# Patient Record
Sex: Female | Born: 1980 | Race: Black or African American | Hispanic: No | Marital: Single | Smoking: Former smoker
Health system: Southern US, Community
[De-identification: ages and names within clinical notes are randomized; demographics above are authoritative.]

## PROBLEM LIST (undated history)

## (undated) ENCOUNTER — Inpatient Hospital Stay (HOSPITAL_COMMUNITY): Payer: Self-pay

## (undated) DIAGNOSIS — F32A Depression, unspecified: Secondary | ICD-10-CM

## (undated) DIAGNOSIS — I2699 Other pulmonary embolism without acute cor pulmonale: Secondary | ICD-10-CM

## (undated) DIAGNOSIS — D573 Sickle-cell trait: Secondary | ICD-10-CM

## (undated) DIAGNOSIS — D649 Anemia, unspecified: Secondary | ICD-10-CM

## (undated) DIAGNOSIS — F419 Anxiety disorder, unspecified: Secondary | ICD-10-CM

## (undated) DIAGNOSIS — IMO0002 Reserved for concepts with insufficient information to code with codable children: Secondary | ICD-10-CM

## (undated) DIAGNOSIS — F329 Major depressive disorder, single episode, unspecified: Secondary | ICD-10-CM

## (undated) HISTORY — PX: FRACTURE SURGERY: SHX138

## (undated) HISTORY — PX: COLPOSCOPY: SHX161

---

## 2008-03-14 DIAGNOSIS — IMO0002 Reserved for concepts with insufficient information to code with codable children: Secondary | ICD-10-CM

## 2008-03-14 DIAGNOSIS — R87619 Unspecified abnormal cytological findings in specimens from cervix uteri: Secondary | ICD-10-CM

## 2008-03-14 HISTORY — DX: Unspecified abnormal cytological findings in specimens from cervix uteri: R87.619

## 2008-03-14 HISTORY — DX: Reserved for concepts with insufficient information to code with codable children: IMO0002

## 2010-10-20 ENCOUNTER — Emergency Department (HOSPITAL_COMMUNITY)
Admission: EM | Admit: 2010-10-20 | Discharge: 2010-10-20 | Disposition: A | Discharge status: Home / Self Care | Payer: Medicaid Other | Attending: Emergency Medicine | Admitting: Emergency Medicine

## 2010-10-20 DIAGNOSIS — B86 Scabies: Secondary | ICD-10-CM | POA: Insufficient documentation

## 2010-12-16 ENCOUNTER — Emergency Department (HOSPITAL_COMMUNITY)
Admission: EM | Admit: 2010-12-16 | Discharge: 2010-12-16 | Disposition: A | Discharge status: Home / Self Care | Payer: Medicaid Other | Attending: Emergency Medicine | Admitting: Emergency Medicine

## 2010-12-16 DIAGNOSIS — B373 Candidiasis of vulva and vagina: Secondary | ICD-10-CM | POA: Insufficient documentation

## 2010-12-16 DIAGNOSIS — R059 Cough, unspecified: Secondary | ICD-10-CM | POA: Insufficient documentation

## 2010-12-16 DIAGNOSIS — B3731 Acute candidiasis of vulva and vagina: Secondary | ICD-10-CM | POA: Insufficient documentation

## 2010-12-16 DIAGNOSIS — R05 Cough: Secondary | ICD-10-CM | POA: Insufficient documentation

## 2010-12-16 DIAGNOSIS — J069 Acute upper respiratory infection, unspecified: Secondary | ICD-10-CM | POA: Insufficient documentation

## 2011-09-03 ENCOUNTER — Emergency Department (HOSPITAL_COMMUNITY)
Admission: EM | Admit: 2011-09-03 | Discharge: 2011-09-04 | Disposition: A | Discharge status: Home / Self Care | Payer: Self-pay | Attending: Emergency Medicine | Admitting: Emergency Medicine

## 2011-09-03 ENCOUNTER — Encounter (HOSPITAL_COMMUNITY): Payer: Self-pay | Admitting: Emergency Medicine

## 2011-09-03 DIAGNOSIS — R110 Nausea: Secondary | ICD-10-CM | POA: Insufficient documentation

## 2011-09-03 LAB — BASIC METABOLIC PANEL
Calcium: 9.4 mg/dL (ref 8.4–10.5)
GFR calc non Af Amer: >90 mL/min (ref >90)
Sodium: 140 mEq/L (ref 135–145)

## 2011-09-03 LAB — CBC
MCH: 30.4 pg (ref 26.0–34.0)
MCHC: 32.9 g/dL (ref 30.0–36.0)
Platelets: 275 10*3/uL (ref 150–400)

## 2011-09-03 LAB — URINALYSIS, ROUTINE W REFLEX MICROSCOPIC
Bilirubin Urine: NEGATIVE
Glucose, UA: NEGATIVE mg/dL
Ketones, ur: NEGATIVE mg/dL
Leukocytes, UA: NEGATIVE
Nitrite: NEGATIVE
Protein, ur: NEGATIVE mg/dL

## 2011-09-03 LAB — HCG, SERUM, QUALITATIVE: Preg, Serum: NEGATIVE

## 2011-09-03 MED ORDER — ONDANSETRON HCL 4 MG/2ML IJ SOLN
4.0000 mg | Freq: Once | INTRAMUSCULAR | Status: AC
  Administered 2011-09-03: 4 mg via INTRAVENOUS
  Filled 2011-09-03: qty 2

## 2011-09-03 MED ORDER — SODIUM CHLORIDE 0.9 % IV BOLUS (SEPSIS)
1000.0000 mL | Freq: Once | INTRAVENOUS | Status: AC
  Administered 2011-09-03: 1000 mL via INTRAVENOUS

## 2011-09-03 NOTE — ED Notes (Signed)
Pt states for "a couple weeks" she has felt weak and also c/o nausea. Pt states she's had cold-like symptoms during this time as well. Productive cough present.

## 2011-09-04 MED ORDER — PROMETHAZINE HCL 25 MG PO TABS: 25.0000 mg | ORAL_TABLET | Freq: Four times a day (QID) | ORAL | Status: DC | PRN

## 2011-09-04 NOTE — ED Provider Notes (Signed)
History     CSN: [id number]  Arrival date & time 09/03/11  2008   First MD Initiated Contact with Patient 09/03/11 2113      Chief Complaint  Patient presents with  . Nausea  . Weakness     The history is provided by the patient.   the patient reports approximately one to 2 weeks of "feeling weird".  She reports nausea without vomiting.  She denies diarrhea.  She's had some upper respiratory symptoms.  She's had cough as well.  She denies chest pain or shortness of breath.  She denies abdominal pain.  She has no dysuria or urinary frequency.  She has no back pain or flank pain.  She denies vaginal pain or vaginal discharge.  She's had no recent sick contacts.  Symptoms are mild in severity.  Nothing worsens nor bruits her symptoms  History reviewed. No pertinent past medical history.  History reviewed. No pertinent past surgical history.  History reviewed. No pertinent family history.  History  Substance Use Topics  . Smoking status: Not on file  . Smokeless tobacco: Not on file  . Alcohol Use: Not on file    OB History    Grav Para Term Preterm Abortions TAB SAB Ect Mult Living                  Review of Systems  Neurological: Positive for weakness.  All other systems reviewed and are negative.    Allergies  Penicillins  Home Medications   Current Outpatient Rx  Name Route Sig Dispense Refill  . ADULT MULTIVITAMIN W/MINERALS CH Oral Take 1 tablet by mouth daily.    Marland Kitchen DAILY DIET SUPPORT PO TABS Oral Take 1 tablet by mouth daily. "Hydroxycut"      BP 113/63  Pulse 75  Temp 98.3 F (36.8 C) (Oral)  Resp 24  SpO2 100%  Physical Exam  Nursing note and vitals reviewed. Constitutional: She is oriented to person, place, and time. She appears well-developed and well-nourished. No distress.  HENT:  Head: Normocephalic and atraumatic.  Eyes: EOM are normal.  Neck: Normal range of motion.  Cardiovascular: Normal rate, regular rhythm and normal heart sounds.     Pulmonary/Chest: Effort normal and breath sounds normal.  Abdominal: Soft. She exhibits no distension. There is no tenderness.  Musculoskeletal: Normal range of motion.  Neurological: She is alert and oriented to person, place, and time.  Skin: Skin is warm and dry.  Psychiatric: She has a normal mood and affect. Judgment normal.    ED Course  Procedures (including critical care time)  Labs Reviewed  CBC - Abnormal; Notable for the following:    RBC 3.81 (*)     Hemoglobin 11.6 (*)     HCT 35.3 (*)     All other components within normal limits  BASIC METABOLIC PANEL - Abnormal; Notable for the following:    Glucose, Bld 105 (*)     All other components within normal limits  URINALYSIS, ROUTINE W REFLEX MICROSCOPIC - Abnormal; Notable for the following:    APPearance CLOUDY (*)     All other components within normal limits  HCG, SERUM, QUALITATIVE  PREGNANCY, URINE   No results found.   1. Nausea       MDM  The patient is well-appearing.  Her vital signs are normal.  Her abdomen is benign.  Her lungs are clear.  Discharge home in good condition.  She will be sent home with prescription for nausea.  Lyanne Co, MD 09/04/11 [phone]

## 2012-02-24 ENCOUNTER — Encounter (HOSPITAL_COMMUNITY): Payer: Self-pay | Admitting: Emergency Medicine

## 2012-02-24 ENCOUNTER — Emergency Department (HOSPITAL_COMMUNITY)
Admission: EM | Admit: 2012-02-24 | Discharge: 2012-02-24 | Disposition: A | Discharge status: Home / Self Care | Payer: Self-pay | Attending: Emergency Medicine | Admitting: Emergency Medicine

## 2012-02-24 DIAGNOSIS — J029 Acute pharyngitis, unspecified: Secondary | ICD-10-CM | POA: Insufficient documentation

## 2012-02-24 DIAGNOSIS — J3489 Other specified disorders of nose and nasal sinuses: Secondary | ICD-10-CM | POA: Insufficient documentation

## 2012-02-24 DIAGNOSIS — R0989 Other specified symptoms and signs involving the circulatory and respiratory systems: Secondary | ICD-10-CM | POA: Insufficient documentation

## 2012-02-24 DIAGNOSIS — Z79899 Other long term (current) drug therapy: Secondary | ICD-10-CM | POA: Insufficient documentation

## 2012-02-24 DIAGNOSIS — J069 Acute upper respiratory infection, unspecified: Secondary | ICD-10-CM | POA: Insufficient documentation

## 2012-02-24 DIAGNOSIS — R05 Cough: Secondary | ICD-10-CM | POA: Insufficient documentation

## 2012-02-24 DIAGNOSIS — R059 Cough, unspecified: Secondary | ICD-10-CM | POA: Insufficient documentation

## 2012-02-24 DIAGNOSIS — H9209 Otalgia, unspecified ear: Secondary | ICD-10-CM | POA: Insufficient documentation

## 2012-02-24 DIAGNOSIS — F17200 Nicotine dependence, unspecified, uncomplicated: Secondary | ICD-10-CM | POA: Insufficient documentation

## 2012-02-24 MED ORDER — FEXOFENADINE-PSEUDOEPHED ER 60-120 MG PO TB12: 1.0000 | ORAL_TABLET | Freq: Two times a day (BID) | ORAL | Status: DC

## 2012-02-24 NOTE — ED Provider Notes (Signed)
History  This chart was scribed for Nancy Bucco, MD by Shari Heritage, ED Scribe. The patient was seen in room TR11C/TR11C. Patient's care was started at 1148.  CSN: [id number]  Arrival date & time 02/24/12  1478   First MD Initiated Contact with Patient 02/24/12 1148      Chief Complaint  Patient presents with  . Headache    The history is provided by the patient. No language interpreter was used.    HPI Comments: Nancy Francis is a 31 y.o. female who presents to the Emergency Department complaining of moderate to severe, constant, pressure-like headache onset yesterday night. Patient states that her pain is present in bilateral temporal regions and maxillary region. There is associated sore throat, nasal and chest congestion, productive cough, and ear pain. Patient states that she is producing yellow phlegm. Patient denies nausea, vomiting, diarrhea, SOB, chest pain, leg swelling, or abdominal pain. She hasn't taken any medications for symptom relief. Patient's two children have checked in at [facility] today complaining of similar symptoms. Patient is a current every day smoker.   History reviewed. No pertinent past medical history.  Past Surgical History  Procedure Date  . Fracture surgery     No family history on file.  History  Substance Use Topics  . Smoking status: Current Every Day Smoker  . Smokeless tobacco: Not on file  . Alcohol Use: Yes    OB History    Grav Para Term Preterm Abortions TAB SAB Ect Mult Living                  Review of Systems  Constitutional: Negative for fever, chills, diaphoresis and fatigue.  HENT: Positive for ear pain, congestion and sore throat. Negative for rhinorrhea and sneezing.   Eyes: Negative.   Respiratory: Positive for cough. Negative for chest tightness and shortness of breath.   Cardiovascular: Negative for chest pain and leg swelling.  Gastrointestinal: Negative for nausea, vomiting, abdominal pain, diarrhea and blood in  stool.  Genitourinary: Negative for frequency, hematuria, flank pain and difficulty urinating.  Musculoskeletal: Negative for back pain and arthralgias.  Skin: Negative for rash.  Neurological: Negative for dizziness, speech difficulty, weakness, numbness and headaches.    Allergies  Penicillins  Home Medications   Current Outpatient Rx  Name  Route  Sig  Dispense  Refill  . FEXOFENADINE-PSEUDOEPHED ER 60-120 MG PO TB12   Oral   Take 1 tablet by mouth every 12 (twelve) hours.   30 tablet   0   . ADULT MULTIVITAMIN W/MINERALS CH   Oral   Take 1 tablet by mouth daily.         Marland Kitchen DAILY DIET SUPPORT PO TABS   Oral   Take 1 tablet by mouth daily. "Hydroxycut"         . PROMETHAZINE HCL 25 MG PO TABS   Oral   Take 1 tablet (25 mg total) by mouth every 6 (six) hours as needed for nausea.   12 tablet   0     Triage Vitals: BP 118/71  Pulse 95  Temp 97.8 F (36.6 C)  Resp 18  SpO2 99%  Physical Exam  Constitutional: She is oriented to person, place, and time. She appears well-developed and well-nourished.  HENT:  Head: Normocephalic and atraumatic.  Right Ear: Tympanic membrane and external ear normal.  Left Ear: Tympanic membrane and external ear normal.  Mouth/Throat: Oropharynx is clear and moist. No posterior oropharyngeal erythema.  Eyes: Pupils are equal,  round, and reactive to light.  Neck: Normal range of motion. Neck supple.  Cardiovascular: Normal rate, regular rhythm and normal heart sounds.   Pulmonary/Chest: Effort normal and breath sounds normal. No respiratory distress. She has no wheezes. She has no rales. She exhibits no tenderness.  Abdominal: Soft. Bowel sounds are normal. There is no tenderness. There is no rebound and no guarding.  Musculoskeletal: Normal range of motion. She exhibits no edema.  Lymphadenopathy:    She has no cervical adenopathy.  Neurological: She is alert and oriented to person, place, and time.  Skin: Skin is warm and dry.  No rash noted.  Psychiatric: She has a normal mood and affect.    ED Course  Procedures (including critical care time) DIAGNOSTIC STUDIES: Oxygen Saturation is 99% on room air, normal by my interpretation.    COORDINATION OF CARE: 12:23 PM- Patient informed of current plan for treatment and evaluation and agrees with plan at this time.     Labs Reviewed - No data to display No results found.   1. URI (upper respiratory infection)       MDM  Patient is well-appearing with likely viral syndrome. She has no meningeal signs. Her headache is mostly facial which is likely attributed to the URI. Her kids are here with the same type symptoms. She was instructed in symptomatic care. Was given a prescription for a Allegra D. Is advised to followup with her primary care physician her symptoms are not improving or return here as needed for any worsening symptoms      I personally performed the services described in this documentation, which was scribed in my presence.  The recorded information has been reviewed and considered.    Nancy Bucco, MD 02/24/12 [phone]

## 2012-02-24 NOTE — ED Notes (Signed)
H/a since last night  Cold s/s also Has sinus congestion also here w/ her 2 kids that also being seen

## 2012-03-14 NOTE — L&D Delivery Note (Signed)
Delivery Note At 12:55 AM a viable female was delivered via  (Presentation: OA ).  APGAR: 9,9 ; weight: 6+9.8. Infant lifted to pt's abd and dried. Cord clamped and cut by FOB. Hospital cord blood sample collected. Placenta status: spont, intact .  Cord:  3 vessel  Anesthesia: Epidural  Episiotomy: none Lacerations: none Est. Blood Loss (mL): 200  Mom to postpartum.  Baby to nursery-stable.  SHAW, KIMBERLY 11/06/2012, 1:13 AM

## 2012-03-14 NOTE — L&D Delivery Note (Signed)
Attestation of Attending Supervision of Advanced Practitioner (CNM/NP): Evaluation and management procedures were performed by the Advanced Practitioner under my supervision and collaboration.  I have reviewed the Advanced Practitioner's note and chart, and I agree with the management and plan.  HARRAWAY-SMITH, Margo Lama 9:12 AM     

## 2012-07-03 ENCOUNTER — Encounter (HOSPITAL_COMMUNITY): Payer: Self-pay

## 2012-07-03 ENCOUNTER — Inpatient Hospital Stay (HOSPITAL_COMMUNITY)
Admission: AD | Admit: 2012-07-03 | Discharge: 2012-07-03 | Disposition: A | Discharge status: Home / Self Care | Payer: Medicaid Other | Source: Ambulatory Visit | Attending: Obstetrics & Gynecology | Admitting: Obstetrics & Gynecology

## 2012-07-03 DIAGNOSIS — W57XXXA Bitten or stung by nonvenomous insect and other nonvenomous arthropods, initial encounter: Secondary | ICD-10-CM

## 2012-07-03 DIAGNOSIS — R21 Rash and other nonspecific skin eruption: Secondary | ICD-10-CM | POA: Insufficient documentation

## 2012-07-03 DIAGNOSIS — O99891 Other specified diseases and conditions complicating pregnancy: Secondary | ICD-10-CM | POA: Insufficient documentation

## 2012-07-03 DIAGNOSIS — R51 Headache: Secondary | ICD-10-CM | POA: Insufficient documentation

## 2012-07-03 HISTORY — DX: Anemia, unspecified: D64.9

## 2012-07-03 HISTORY — DX: Reserved for concepts with insufficient information to code with codable children: IMO0002

## 2012-07-03 HISTORY — DX: Sickle-cell trait: D57.3

## 2012-07-03 MED ORDER — HYDROCORTISONE 1 % EX CREA: TOPICAL_CREAM | Freq: Three times a day (TID) | CUTANEOUS | Status: DC

## 2012-07-03 NOTE — MAU Note (Signed)
Patient is in with c/o right lateral neck rash (4 raised reddened rash on patient neck and behind right ear. No draining). Patient states that she noticed the rash this morning, and since then have a headache, rash on her chest. Patient states that "she feels weird". She denies having shortness of breath, denies pain when swallowing, denies abdominal pain, denies vaginal bleeding or abnormal vaginal discharge. Patient states that her [facility] OB app[facility] is next [facility]. FHT 143-151

## 2012-07-03 NOTE — MAU Provider Note (Signed)
History     CSN: [id number]  Arrival date and time: 07/03/12 1624   First Provider Initiated Contact with Patient 07/03/12 1706      Chief Complaint  Patient presents with  . Rash  . Headache  . Otalgia   HPI 32 y.o. G4P3003 at [redacted]w[redacted]d with bumps on neck since this morning. Not itching. No fever/chills. No trouble breathing or swallowing. Has been outside walking earlier. Not sore or tender. Has allergy to PCN but no medications recently. No new foods or skin products. Hasn't seen any biting insects. No other family members with bites. C/o headache and some mild vertigo. No nausea/vomiting.   No prenatal care. [facility]   LMP November. All vaginal deliveries x 3. Youngest child 87 yo.   OB History   Grav Para Term Preterm Abortions TAB SAB Ect Mult Living   4 3 3       3       Past Medical History  Diagnosis Date  . Sickle cell trait   . Abnormal Pap smear   . Anemia     Past Surgical History  Procedure Laterality Date  . Fracture surgery    . Colposcopy      History reviewed. No pertinent family history.  History  Substance Use Topics  . Smoking status: Former Games developer  . Smokeless tobacco: Not on file  . Alcohol Use: Yes    Allergies:  Allergies  Allergen Reactions  . Penicillins Hives    Prescriptions prior to admission  Medication Sig Dispense Refill  . Prenatal Vit-Fe Fumarate-FA (PRENATAL MULTIVITAMIN) TABS Take 1 tablet by mouth daily at 12 noon.      . promethazine (PHENERGAN) 25 MG tablet Take 1 tablet (25 mg total) by mouth every 6 (six) hours as needed for nausea.  12 tablet  0  . [DISCONTINUED] fexofenadine-pseudoephedrine (ALLEGRA-D) 60-120 MG per tablet Take 1 tablet by mouth every 12 (twelve) hours.  30 tablet  0  . [DISCONTINUED] Multiple Vitamin (MULTIVITAMIN WITH MINERALS) TABS Take 1 tablet by mouth daily.      . [DISCONTINUED] Multiple Vitamins-Minerals (DAILY DIET SUPPORT) TABS Take 1 tablet by mouth  daily. "Hydroxycut"        Review of Systems  Constitutional: Negative for fever and chills.  Eyes: Negative for blurred vision and double vision.  Respiratory: Negative for cough, shortness of breath and wheezing.   Cardiovascular: Negative for chest pain.  Gastrointestinal: Negative for nausea, vomiting and abdominal pain.  Genitourinary: Negative for dysuria.  Skin: Positive for rash. Negative for itching.  Neurological: Positive for headaches.   Physical Exam   Blood pressure 119/69, pulse 106, temperature 99.1 F (37.3 C), temperature source Oral, resp. rate 18, height 5\' 4"  (1.626 m), weight 85.787 kg (189 lb 2 oz), SpO2 100.00%.  Physical Exam  Constitutional: She is oriented to person, place, and time. She appears well-developed and well-nourished. No distress.  HENT:  Head: Normocephalic and atraumatic.  Right Ear: External ear normal.  Left Ear: External ear normal.  Mouth/Throat: Oropharynx is clear and moist.  Eyes: Conjunctivae and EOM are normal. Right eye exhibits no discharge. Left eye exhibits no discharge. No scleral icterus.  Neck: Normal range of motion. Neck supple. No tracheal deviation present.  Cardiovascular: Normal rate, regular rhythm and normal heart sounds.   Respiratory: Effort normal and breath sounds normal. No stridor. No respiratory distress. She has no wheezes.  GI: Soft. Bowel sounds are normal. There is no tenderness.  There is no rebound and no guarding.  Musculoskeletal: Normal range of motion. She exhibits no edema and no tenderness.  Lymphadenopathy:    She has no cervical adenopathy.  Neurological: She is alert and oriented to person, place, and time.  Skin: Skin is warm and dry.  Several pale/red raised papules/wheals on right neck with one or two post-auricular. Non-tender. Largest about 1 cm. No blistering or drainage, non-fluctuant.   Psychiatric: She has a normal mood and affect.    MAU Course  Procedures   Assessment and Plan   32 y.o. G4P3003 at [redacted]w[redacted]d with allergic rash on neck - Likely bug bites - Hydrocortisone cream - Benadryl as needed for itching. - precautions regarding worsening allergic reaction - wheezing, SOB, throat tightness- given. - F/U with OB or PCP as needed.  Napoleon Form 07/03/2012, 5:08 PM

## 2012-07-03 NOTE — MAU Provider Note (Signed)
Attestation of Attending Supervision of Obstetric Fellow: Evaluation and management procedures were performed by the Obstetric Fellow under my supervision and collaboration.  I have reviewed the Obstetric Fellow's note and chart, and I agree with the management and plan.  UGONNA  ANYANWU, MD, FACOG Attending Obstetrician & Gynecologist Faculty Practice, [facility]   

## 2012-07-09 ENCOUNTER — Other Ambulatory Visit (HOSPITAL_COMMUNITY): Payer: Self-pay | Admitting: Physician Assistant

## 2012-07-09 DIAGNOSIS — Z3689 Encounter for other specified antenatal screening: Secondary | ICD-10-CM

## 2012-07-09 LAB — OB RESULTS CONSOLE HIV ANTIBODY (ROUTINE TESTING): HIV: NONREACTIVE

## 2012-07-09 LAB — OB RESULTS CONSOLE RUBELLA ANTIBODY, IGM: Rubella: IMMUNE

## 2012-07-09 LAB — OB RESULTS CONSOLE HEPATITIS B SURFACE ANTIGEN: Hepatitis B Surface Ag: NEGATIVE

## 2012-07-09 LAB — OB RESULTS CONSOLE ANTIBODY SCREEN: Antibody Screen: NEGATIVE

## 2012-07-09 LAB — OB RESULTS CONSOLE RPR: RPR: NONREACTIVE

## 2012-07-09 LAB — OB RESULTS CONSOLE GC/CHLAMYDIA: Chlamydia: NEGATIVE

## 2012-07-10 ENCOUNTER — Ambulatory Visit (HOSPITAL_COMMUNITY)
Admission: RE | Admit: 2012-07-10 | Discharge: 2012-07-10 | Disposition: A | Discharge status: Home / Self Care | Payer: Medicaid Other | Source: Ambulatory Visit | Attending: Physician Assistant | Admitting: Physician Assistant

## 2012-07-10 DIAGNOSIS — Z1389 Encounter for screening for other disorder: Secondary | ICD-10-CM | POA: Insufficient documentation

## 2012-07-10 DIAGNOSIS — Z3689 Encounter for other specified antenatal screening: Secondary | ICD-10-CM

## 2012-07-10 DIAGNOSIS — Z363 Encounter for antenatal screening for malformations: Secondary | ICD-10-CM | POA: Insufficient documentation

## 2012-07-10 DIAGNOSIS — O358XX0 Maternal care for other (suspected) fetal abnormality and damage, not applicable or unspecified: Secondary | ICD-10-CM | POA: Insufficient documentation

## 2012-08-10 ENCOUNTER — Other Ambulatory Visit (HOSPITAL_COMMUNITY): Payer: Self-pay | Admitting: Nurse Practitioner

## 2012-08-10 DIAGNOSIS — Z1389 Encounter for screening for other disorder: Secondary | ICD-10-CM

## 2012-08-27 ENCOUNTER — Ambulatory Visit (HOSPITAL_COMMUNITY)
Admission: RE | Admit: 2012-08-27 | Discharge: 2012-08-27 | Disposition: A | Discharge status: Home / Self Care | Payer: Medicaid Other | Source: Ambulatory Visit | Attending: Nurse Practitioner | Admitting: Nurse Practitioner

## 2012-08-27 DIAGNOSIS — O365990 Maternal care for other known or suspected poor fetal growth, unspecified trimester, not applicable or unspecified: Secondary | ICD-10-CM | POA: Insufficient documentation

## 2012-08-27 DIAGNOSIS — Z1389 Encounter for screening for other disorder: Secondary | ICD-10-CM

## 2012-08-27 DIAGNOSIS — Z3689 Encounter for other specified antenatal screening: Secondary | ICD-10-CM | POA: Insufficient documentation

## 2012-10-01 ENCOUNTER — Inpatient Hospital Stay (HOSPITAL_COMMUNITY)
Admission: AD | Admit: 2012-10-01 | Discharge: 2012-10-01 | Disposition: A | Discharge status: Home / Self Care | Payer: Medicaid Other | Source: Ambulatory Visit | Attending: Obstetrics & Gynecology | Admitting: Obstetrics & Gynecology

## 2012-10-01 ENCOUNTER — Encounter (HOSPITAL_COMMUNITY): Payer: Self-pay | Admitting: *Deleted

## 2012-10-01 DIAGNOSIS — O99891 Other specified diseases and conditions complicating pregnancy: Secondary | ICD-10-CM | POA: Insufficient documentation

## 2012-10-01 DIAGNOSIS — M899 Disorder of bone, unspecified: Secondary | ICD-10-CM

## 2012-10-01 DIAGNOSIS — R51 Headache: Secondary | ICD-10-CM | POA: Insufficient documentation

## 2012-10-01 DIAGNOSIS — G43009 Migraine without aura, not intractable, without status migrainosus: Secondary | ICD-10-CM

## 2012-10-01 DIAGNOSIS — Z3490 Encounter for supervision of normal pregnancy, unspecified, unspecified trimester: Secondary | ICD-10-CM

## 2012-10-01 DIAGNOSIS — N949 Unspecified condition associated with female genital organs and menstrual cycle: Secondary | ICD-10-CM | POA: Insufficient documentation

## 2012-10-01 LAB — URINALYSIS, ROUTINE W REFLEX MICROSCOPIC
Bilirubin Urine: NEGATIVE
Hgb urine dipstick: NEGATIVE
Ketones, ur: NEGATIVE mg/dL
Nitrite: NEGATIVE
pH: 7 (ref 5.0–8.0)

## 2012-10-01 MED ORDER — BUTALBITAL-APAP-CAFFEINE 50-325-40 MG PO TABS
1.0000 | ORAL_TABLET | Freq: Once | ORAL | Status: AC
  Administered 2012-10-01: 1 via ORAL
  Filled 2012-10-01: qty 1

## 2012-10-01 MED ORDER — BUTALBITAL-APAP-CAFFEINE 50-325-40 MG PO TABS: 1.0000 | ORAL_TABLET | Freq: Four times a day (QID) | ORAL | Status: DC | PRN

## 2012-10-01 NOTE — MAU Note (Signed)
Pt states here for headache, told by her MD that her systolic bp was elevated. Headache intermittent x5 days, usually beginning from 1300-0200 every day. Tylenol ineffective. Also feeling nauseated with headache. Also having pelvic pain bilaterally. Increased swelling.

## 2012-10-01 NOTE — MAU Provider Note (Signed)
History     CSN: [id number]  Arrival date and time: 10/01/12 1621     Chief Complaint  Patient presents with  . Headache  . Nausea   HPI  32 yo G4P3003 at [redacted]w[redacted]d by LMP who is here today for headache x 5 days and pelvic pain. States HA starts after breakfast and then will last all day.  Headache is throbbing ache on left side from forehead all the way to the back. Has nausea. No photo/phonophobia.  No vision changes. No weakness. Never lets up- 10/10. Has never had headaches like this before. No hx of migraines. Taking tylenol but not regularly- doesn't help.   Pelvic pain- in her hips particularly when laying on her sides. Worse with walking long distances. No fevers, chills, nausea, vomiting, diarrhea, constipation.    [facility]- late entry in April.    No LOF, VB. +FM. Irregular contractions.     Past Medical History  Diagnosis Date  . Sickle cell trait   . Abnormal Pap smear   . Anemia     Past Surgical History  Procedure Laterality Date  . Fracture surgery    . Colposcopy      History reviewed. No pertinent family history.  History  Substance Use Topics  . Smoking status: Former Games developer  . Smokeless tobacco: Not on file  . Alcohol Use: Yes    Allergies:  Allergies  Allergen Reactions  . Penicillins Hives    Prescriptions prior to admission  Medication Sig Dispense Refill  . acetaminophen (TYLENOL) 500 MG tablet Take 1,000 mg by mouth every 6 (six) hours as needed for pain (For headache.).      Marland Kitchen Prenatal Vit-Fe Fumarate-FA (PRENATAL MULTIVITAMIN) TABS Take 1 tablet by mouth daily at 12 noon.      . promethazine (PHENERGAN) 25 MG tablet Take 25 mg by mouth every 6 (six) hours as needed for nausea.        ROS Physical Exam   Blood pressure 119/62, pulse 94, temperature 97.9 F (36.6 C), temperature source Oral, resp. rate 16, height 5\' 4"  (1.626 m), weight 89.982 kg (198 lb 6 oz), SpO2 100.00%.  Physical Exam  Vitals  reviewed. Constitutional: She is oriented to person, place, and time. She appears well-developed.  HENT:  Head: Normocephalic and atraumatic.  Mouth/Throat: Oropharynx is clear and moist.  Eyes: Conjunctivae and EOM are normal. Pupils are equal, round, and reactive to light.  Neck: Neck supple.  Cardiovascular: Normal rate, regular rhythm and normal heart sounds.   Respiratory: Effort normal and breath sounds normal.  GI: Soft.  Musculoskeletal: Normal range of motion.  Significant tenderness with palpation of the pubic symphysis  Neurological: She is alert and oriented to person, place, and time. She has normal reflexes. No cranial nerve deficit. Coordination normal.  Skin: Skin is warm and dry.  Psychiatric: She has a normal mood and affect.   SVE: 1/50/-3 FHT: 140s, mod var, + accels, no decels TOCO: quiet.   MAU Course  Procedures  MDM Pt with headache with similar features to a migraine. Has not gotten relief with tylenol. No hx of migraines. No neuro symptoms.  Normal exam.    Assessment and Plan  1) headache - story consistent more with a  Migraine. bp normal.  - will treat with a trial of fiorecet  - no neuro abnormalities   Headache improved to 2/10 within 30 min of fiorecet.   - will plan to d/c  to home with rx for fiorecet #10 to use for the next couple days if needed.  - advised to call or return should her headache change or worsen.   2) pelvic pain - sve: 1/50/-3 in a multiparous woman not concerning and not contractions on the monitor.  - tender to palpation of the pubic symphysis - suspect pubic symphysis pain - reassurance given. Education given.    3) FWB- cat I tracing  F/u as scheduled with clinic or prn.    Peretz Thieme L MD 10/01/2012, 5:54 PM

## 2012-10-27 ENCOUNTER — Encounter (HOSPITAL_COMMUNITY): Payer: Self-pay | Admitting: *Deleted

## 2012-10-27 ENCOUNTER — Inpatient Hospital Stay (HOSPITAL_COMMUNITY)
Admission: AD | Admit: 2012-10-27 | Discharge: 2012-10-28 | Disposition: A | Discharge status: Home / Self Care | Payer: Medicaid Other | Source: Ambulatory Visit | Attending: Obstetrics & Gynecology | Admitting: Obstetrics & Gynecology

## 2012-10-27 DIAGNOSIS — R6883 Chills (without fever): Secondary | ICD-10-CM | POA: Insufficient documentation

## 2012-10-27 DIAGNOSIS — O212 Late vomiting of pregnancy: Secondary | ICD-10-CM | POA: Insufficient documentation

## 2012-10-27 DIAGNOSIS — O99891 Other specified diseases and conditions complicating pregnancy: Secondary | ICD-10-CM | POA: Insufficient documentation

## 2012-10-27 HISTORY — DX: Depression, unspecified: F32.A

## 2012-10-27 HISTORY — DX: Anxiety disorder, unspecified: F41.9

## 2012-10-27 HISTORY — DX: Major depressive disorder, single episode, unspecified: F32.9

## 2012-10-27 NOTE — MAU Note (Signed)
PT SAYS  HURT BAD AT 7PM-     GETS PNC AT HD-   VE IN CLINIC 1-2 CM   .   DENIES HSV AND MRSA.  GBS- NEG.      SAYS FEELS NAUSEATED-    4 PM- NO MED      FEELS CHILLS- STARTED  AT 6 PM.

## 2012-10-28 ENCOUNTER — Encounter (HOSPITAL_COMMUNITY): Payer: Self-pay | Admitting: *Deleted

## 2012-10-28 MED ORDER — ONDANSETRON 8 MG PO TBDP: 8.0000 mg | ORAL_TABLET | Freq: Once | ORAL | Status: DC

## 2012-10-28 NOTE — MAU Note (Signed)
Patent states she no longer needs the Zofran. Nausea has resolved with Ginger ale.

## 2012-11-03 ENCOUNTER — Inpatient Hospital Stay (HOSPITAL_COMMUNITY)
Admission: AD | Admit: 2012-11-03 | Discharge: 2012-11-04 | Disposition: A | Discharge status: Home / Self Care | Payer: Medicaid Other | Source: Ambulatory Visit | Attending: Obstetrics & Gynecology | Admitting: Obstetrics & Gynecology

## 2012-11-03 DIAGNOSIS — IMO0002 Reserved for concepts with insufficient information to code with codable children: Secondary | ICD-10-CM | POA: Insufficient documentation

## 2012-11-03 DIAGNOSIS — M549 Dorsalgia, unspecified: Secondary | ICD-10-CM | POA: Insufficient documentation

## 2012-11-03 DIAGNOSIS — R109 Unspecified abdominal pain: Secondary | ICD-10-CM | POA: Insufficient documentation

## 2012-11-03 DIAGNOSIS — O99891 Other specified diseases and conditions complicating pregnancy: Secondary | ICD-10-CM | POA: Insufficient documentation

## 2012-11-04 ENCOUNTER — Encounter (HOSPITAL_COMMUNITY): Payer: Self-pay | Admitting: *Deleted

## 2012-11-04 NOTE — MAU Note (Signed)
PT SAYS SHE HAS BEEN CRAMPING- STARTED  ON  Thursday.  SAYS HAS SWOLLEN FEET-  SHE PUT ICE ON THEM AND  ELEVATED -  DECREASE SWELLING.   HANDS SWOLLEN-   SAYS LESS NOW.    SAYS HAS BACK PAIN- THAT STARTED Thursday NIGHT.    VE 2-3 CM  ON Thursday  AT [facility] ON [address]. GBS-NEG PER PT.   DENIES HSV AND MRSA.

## 2012-11-05 ENCOUNTER — Inpatient Hospital Stay (HOSPITAL_COMMUNITY)
Admission: AD | Admit: 2012-11-05 | Discharge: 2012-11-07 | DRG: 775 | Disposition: A | Discharge status: Home / Self Care | Payer: Medicaid Other | Source: Ambulatory Visit | Attending: Obstetrics & Gynecology | Admitting: Obstetrics & Gynecology

## 2012-11-05 ENCOUNTER — Encounter (HOSPITAL_COMMUNITY): Payer: Self-pay | Admitting: Anesthesiology

## 2012-11-05 ENCOUNTER — Encounter (HOSPITAL_COMMUNITY): Payer: Self-pay | Admitting: *Deleted

## 2012-11-05 ENCOUNTER — Inpatient Hospital Stay (HOSPITAL_COMMUNITY): Payer: Medicaid Other | Admitting: Anesthesiology

## 2012-11-05 LAB — CBC
Hemoglobin: 10.7 g/dL — ABNORMAL LOW (ref 12.0–15.0)
MCV: 92.9 fL (ref 78.0–100.0)
Platelets: 192 10*3/uL (ref 150–400)
RBC: 3.51 MIL/uL — ABNORMAL LOW (ref 3.87–5.11)
WBC: 6 10*3/uL (ref 4.0–10.5)

## 2012-11-05 MED ORDER — EPHEDRINE 5 MG/ML INJ
10.0000 mg | INTRAVENOUS | Status: DC | PRN
  Filled 2012-11-05: qty 2

## 2012-11-05 MED ORDER — DIPHENHYDRAMINE HCL 50 MG/ML IJ SOLN: 12.5000 mg | INTRAMUSCULAR | Status: DC | PRN

## 2012-11-05 MED ORDER — FENTANYL 2.5 MCG/ML BUPIVACAINE 1/10 % EPIDURAL INFUSION (WH - ANES)
14.0000 mL/h | INTRAMUSCULAR | Status: DC | PRN
  Administered 2012-11-05: 14 mL/h via EPIDURAL
  Filled 2012-11-05: qty 125

## 2012-11-05 MED ORDER — LIDOCAINE HCL (PF) 1 % IJ SOLN
INTRAMUSCULAR | Status: DC | PRN
  Administered 2012-11-05 (×4): 4 mL

## 2012-11-05 MED ORDER — EPHEDRINE 5 MG/ML INJ
10.0000 mg | INTRAVENOUS | Status: DC | PRN
  Filled 2012-11-05: qty 2
  Filled 2012-11-05: qty 4

## 2012-11-05 MED ORDER — ONDANSETRON HCL 4 MG/2ML IJ SOLN
4.0000 mg | Freq: Four times a day (QID) | INTRAMUSCULAR | Status: DC | PRN
  Filled 2012-11-05: qty 2

## 2012-11-05 MED ORDER — FENTANYL CITRATE 0.05 MG/ML IJ SOLN: 100.0000 ug | INTRAMUSCULAR | Status: DC | PRN

## 2012-11-05 MED ORDER — LACTATED RINGERS IV SOLN: 500.0000 mL | INTRAVENOUS | Status: DC | PRN

## 2012-11-05 MED ORDER — OXYTOCIN BOLUS FROM INFUSION: 500.0000 mL | INTRAVENOUS | Status: DC

## 2012-11-05 MED ORDER — CITRIC ACID-SODIUM CITRATE 334-500 MG/5ML PO SOLN: 30.0000 mL | ORAL | Status: DC | PRN

## 2012-11-05 MED ORDER — LACTATED RINGERS IV SOLN
INTRAVENOUS | Status: DC
  Administered 2012-11-05: 16:00:00 via INTRAVENOUS

## 2012-11-05 MED ORDER — LIDOCAINE HCL (PF) 1 % IJ SOLN
30.0000 mL | INTRAMUSCULAR | Status: DC | PRN
  Filled 2012-11-05 (×3): qty 30

## 2012-11-05 MED ORDER — LACTATED RINGERS IV SOLN
500.0000 mL | Freq: Once | INTRAVENOUS | Status: AC
  Administered 2012-11-05: 500 mL via INTRAVENOUS

## 2012-11-05 MED ORDER — PHENYLEPHRINE 40 MCG/ML (10ML) SYRINGE FOR IV PUSH (FOR BLOOD PRESSURE SUPPORT)
80.0000 ug | PREFILLED_SYRINGE | INTRAVENOUS | Status: DC | PRN
  Filled 2012-11-05: qty 2
  Filled 2012-11-05: qty 5

## 2012-11-05 MED ORDER — OXYTOCIN 40 UNITS IN LACTATED RINGERS INFUSION - SIMPLE MED
1.0000 m[IU]/min | INTRAVENOUS | Status: DC
  Administered 2012-11-05: 2 m[IU]/min via INTRAVENOUS

## 2012-11-05 MED ORDER — IBUPROFEN 600 MG PO TABS
600.0000 mg | ORAL_TABLET | Freq: Four times a day (QID) | ORAL | Status: DC | PRN
  Filled 2012-11-05 (×4): qty 1

## 2012-11-05 MED ORDER — OXYTOCIN 40 UNITS IN LACTATED RINGERS INFUSION - SIMPLE MED
62.5000 mL/h | INTRAVENOUS | Status: DC
  Administered 2012-11-06: 62.5 mL/h via INTRAVENOUS
  Filled 2012-11-05: qty 1000

## 2012-11-05 MED ORDER — OXYCODONE-ACETAMINOPHEN 5-325 MG PO TABS: 1.0000 | ORAL_TABLET | ORAL | Status: DC | PRN

## 2012-11-05 MED ORDER — TERBUTALINE SULFATE 1 MG/ML IJ SOLN: 0.2500 mg | Freq: Once | INTRAMUSCULAR | Status: AC | PRN

## 2012-11-05 MED ORDER — ACETAMINOPHEN 325 MG PO TABS: 650.0000 mg | ORAL_TABLET | ORAL | Status: DC | PRN

## 2012-11-05 MED ORDER — PHENYLEPHRINE 40 MCG/ML (10ML) SYRINGE FOR IV PUSH (FOR BLOOD PRESSURE SUPPORT)
80.0000 ug | PREFILLED_SYRINGE | INTRAVENOUS | Status: DC | PRN
  Filled 2012-11-05: qty 2

## 2012-11-05 NOTE — H&P (Signed)
Nancy Francis is a 32 y.o. female presenting for SROM. She had gushing of fluid starting at 1pm which continued to occur intermittently as she was en route to the hospital. She was feeling contractions one hour before LOF occured but did not have painful contractions upon arriving to the hospital until recently. She has had good fetal movement.   She [facility]. No GHTN or GDM. Taking PNVs. No vaginal bleeding, no headache, no vomiting, no seizures, no abdominal pain, no diarrhea or constipation. Has had nausea. Breastfeeding/bottlefeeding postpartum. Contraception: Mirena.  Maternal Medical History:  Reason for admission: Rupture of membranes.   Fetal activity: Perceived fetal activity is normal.   Last perceived fetal movement was within the past hour.    Prenatal complications: No bleeding, HIV, hypertension, pre-eclampsia or preterm labor.   Prenatal Complications - Diabetes: none.    OB History   Grav Para Term Preterm Abortions TAB SAB Ect Mult Living   4 3 3  0 0 0 0 0 0 3     Past Medical History  Diagnosis Date  . Sickle cell trait   . Anemia   . Anxiety   . Depression   . Abnormal Pap smear 2010    repeat was negative   Past Surgical History  Procedure Laterality Date  . Fracture surgery    . Colposcopy     Family History: family history is not on file. Social History:  reports that she has quit smoking. She does not have any smokeless tobacco history on file. She reports that  drinks alcohol. She reports that she does not use illicit drugs.   Prenatal Transfer Tool  Maternal Diabetes: No Genetic Screening: Normal Maternal Ultrasounds/Referrals: Normal Fetal Ultrasounds or other Referrals:  None Maternal Substance Abuse:  No Significant Maternal Medications:  None Significant Maternal Lab Results:  None Other Comments:  None  Review of Systems  Constitutional: Negative for fever and chills.  Eyes: Negative for  blurred vision.  Gastrointestinal: Negative for vomiting, abdominal pain, diarrhea and constipation.  Genitourinary: Negative for dysuria.  Neurological: Negative for headaches.    Dilation: 2.5 Effacement (%): 60 Station: -2 Exam by:: Reola Calkins, MD Blood pressure 115/65, pulse 107, temperature 98.2 F (36.8 C), temperature source Oral, resp. rate 20, height 5\' 4"  (1.626 m), weight 87.544 kg (193 lb), SpO2 99.00%.   Fetal Exam Fetal Monitor Review: Mode: ultrasound.   Baseline rate: 130.  Variability: moderate (6-25 bpm).   Pattern: accelerations present and no decelerations.    Fetal State Assessment: Category I - tracings are normal.     Physical Exam  Constitutional: She is oriented to person, place, and time. She appears well-developed and well-nourished.  Cardiovascular: Normal rate, regular rhythm and normal heart sounds.   Respiratory: Effort normal and breath sounds normal. No respiratory distress.  GI: There is no tenderness. There is no rebound and no guarding.  Genitourinary: Vagina normal.  Neurological: She is alert and oriented to person, place, and time.  Skin: Skin is warm and dry.    Prenatal labs: ABO, Rh: B/Positive/-- (04/28 0000) Antibody: Negative (04/28 0000) Rubella: Immune (04/28 0000) RPR: Nonreactive (04/28 0000)  HBsAg: Negative (04/28 0000)  HIV: Non-reactive (04/28 0000)  GBS: Negative (08/11 0000)   Assessment/Plan: 32 yo G4P3 at [redacted]w[redacted]d presenting with SROM and admitted for augmentation of labor.  #Augmentation of labor - admit to birthing suite - admitting attending physician: Dr. Erin Fulling - routine orders - pitocin 2x2 - monitor  FHT  #Postpartum - Method of feeding: breastfeeding/bottlefeeding - Contraception: Mirena - Circumcision: N/a (Girl)  Jacquelin Hawking, MD 11/05/2012, 3:46 PM  I have seen and examined this patient and agree with above documentation in the resident's note. Pt is a 32 yo G4P3003 @[redacted]w[redacted]d  by LMP c/w 20wk [place]  who presents with SROM and is being admitted for augmentation of labor.  Multiparous with a favorable bishop's score- will augment with pitocin.  FWB- cat I tracing.  Proven pelvis to 8lb2oz.  EFW 7.5lbs. Anticipate SVD  Rulon Abide, M.D. [facility] 11/05/2012 7:23 PM

## 2012-11-05 NOTE — H&P (Signed)
Attestation of Attending Supervision of Advanced Practitioner (CNM/NP): Evaluation and management procedures were performed by the Advanced Practitioner under my supervision and collaboration.  I have reviewed the Advanced Practitioner's note and chart, and I agree with the management and plan.  HARRAWAY-SMITH, Bart Ashford 10:26 PM     

## 2012-11-05 NOTE — Anesthesia Procedure Notes (Signed)
Epidural Patient location during procedure: OB Start time: 11/05/2012 10:49 PM  Staffing Performed by: anesthesiologist   Preanesthetic Checklist Completed: patient identified, site marked, surgical consent, pre-op evaluation, timeout performed, IV checked, risks and benefits discussed and monitors and equipment checked  Epidural Patient position: sitting Prep: site prepped and draped and DuraPrep Patient monitoring: continuous pulse ox and blood pressure Approach: midline Injection technique: LOR air  Needle:  Needle type: Tuohy  Needle gauge: 17 G Needle length: 9 cm and 9 Needle insertion depth: 6 cm Catheter type: closed end flexible Catheter size: 19 Gauge Catheter at skin depth: 11 cm Test dose: negative  Assessment Events: blood not aspirated, injection not painful, no injection resistance, negative IV test and no paresthesia  Additional Notes Discussed risk of headache, infection, bleeding, nerve injury and failed or incomplete block.  Patient voices understanding and wishes to proceed.  Epidural placed easily on first attempt.  No paresthesia.  Patient tolerated procedure well with no apparent complications.  Jasmine December, MD

## 2012-11-05 NOTE — Anesthesia Preprocedure Evaluation (Addendum)
Anesthesia Evaluation  Patient identified by MRN, date of birth, ID band Patient awake    Reviewed: Allergy & Precautions, H&P , NPO status , Patient's Chart, lab work & pertinent test results, reviewed documented beta blocker date and time   History of Anesthesia Complications Negative for: history of anesthetic complications  Airway Mallampati: I TM Distance: >3 FB Neck ROM: full    Dental  (+) Teeth Intact   Pulmonary former smoker,  breath sounds clear to auscultation        Cardiovascular negative cardio ROS  Rhythm:regular Rate:Normal     Neuro/Psych PSYCHIATRIC DISORDERS (anxiety/depression) negative neurological ROS     GI/Hepatic negative GI ROS, Neg liver ROS,   Endo/Other  negative endocrine ROSObese - BMI 33.2  Renal/GU negative Renal ROS     Musculoskeletal   Abdominal   Peds  Hematology  (+) Sickle cell trait and anemia ,   Anesthesia Other Findings   Reproductive/Obstetrics (+) Pregnancy                          Anesthesia Physical Anesthesia Plan  ASA: II  Anesthesia Plan: Epidural   Post-op Pain Management:    Induction:   Airway Management Planned:   Additional Equipment:   Intra-op Plan:   Post-operative Plan:   Informed Consent: I have reviewed the patients History and Physical, chart, labs and discussed the procedure including the risks, benefits and alternatives for the proposed anesthesia with the patient or authorized representative who has indicated his/her understanding and acceptance.     Plan Discussed with:   Anesthesia Plan Comments:         Anesthesia Quick Evaluation

## 2012-11-05 NOTE — MAU Note (Signed)
SROM  1300  Good fetal movement  Reports big gush of fluid

## 2012-11-06 ENCOUNTER — Encounter (HOSPITAL_COMMUNITY): Payer: Self-pay | Admitting: *Deleted

## 2012-11-06 MED ORDER — DIBUCAINE 1 % RE OINT: 1.0000 "application " | TOPICAL_OINTMENT | RECTAL | Status: DC | PRN

## 2012-11-06 MED ORDER — ONDANSETRON HCL 4 MG PO TABS: 4.0000 mg | ORAL_TABLET | ORAL | Status: DC | PRN

## 2012-11-06 MED ORDER — ZOLPIDEM TARTRATE 5 MG PO TABS: 5.0000 mg | ORAL_TABLET | Freq: Every evening | ORAL | Status: DC | PRN

## 2012-11-06 MED ORDER — IBUPROFEN 600 MG PO TABS
600.0000 mg | ORAL_TABLET | Freq: Four times a day (QID) | ORAL | Status: DC
  Administered 2012-11-06 – 2012-11-07 (×6): 600 mg via ORAL
  Filled 2012-11-06 (×2): qty 1

## 2012-11-06 MED ORDER — SIMETHICONE 80 MG PO CHEW: 80.0000 mg | CHEWABLE_TABLET | ORAL | Status: DC | PRN

## 2012-11-06 MED ORDER — WITCH HAZEL-GLYCERIN EX PADS: 1.0000 "application " | MEDICATED_PAD | CUTANEOUS | Status: DC | PRN

## 2012-11-06 MED ORDER — PRENATAL MULTIVITAMIN CH
1.0000 | ORAL_TABLET | Freq: Every day | ORAL | Status: DC
  Administered 2012-11-06 – 2012-11-07 (×2): 1 via ORAL
  Filled 2012-11-06 (×2): qty 1

## 2012-11-06 MED ORDER — DIPHENHYDRAMINE HCL 25 MG PO CAPS: 25.0000 mg | ORAL_CAPSULE | Freq: Four times a day (QID) | ORAL | Status: DC | PRN

## 2012-11-06 MED ORDER — ONDANSETRON HCL 4 MG/2ML IJ SOLN: 4.0000 mg | INTRAMUSCULAR | Status: DC | PRN

## 2012-11-06 MED ORDER — SENNOSIDES-DOCUSATE SODIUM 8.6-50 MG PO TABS
2.0000 | ORAL_TABLET | Freq: Every day | ORAL | Status: DC
  Administered 2012-11-06: 2 via ORAL

## 2012-11-06 MED ORDER — OXYCODONE-ACETAMINOPHEN 5-325 MG PO TABS
1.0000 | ORAL_TABLET | ORAL | Status: DC | PRN
  Administered 2012-11-06 – 2012-11-07 (×4): 1 via ORAL
  Filled 2012-11-06 (×5): qty 1

## 2012-11-06 MED ORDER — BENZOCAINE-MENTHOL 20-0.5 % EX AERO
1.0000 "application " | INHALATION_SPRAY | CUTANEOUS | Status: DC | PRN
  Administered 2012-11-06: 1 via TOPICAL
  Filled 2012-11-06: qty 56

## 2012-11-06 MED ORDER — TETANUS-DIPHTH-ACELL PERTUSSIS 5-2.5-18.5 LF-MCG/0.5 IM SUSP: 0.5000 mL | Freq: Once | INTRAMUSCULAR | Status: DC

## 2012-11-06 MED ORDER — LANOLIN HYDROUS EX OINT: TOPICAL_OINTMENT | CUTANEOUS | Status: DC | PRN
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
## 2012-11-06 NOTE — Lactation Note (Signed)
This note was copied from the chart of Nancy Francis. Lactation Consultation Note: initial visit with mom. Baby is just finishing bath and is ready to go skin to skin, Assisted with latch but visitors present and mom wants to try later. Reports that baby nursed for 15 minutes about 1 hour ago but that it hurt while she was feeding. Encouraged wide open mouth and keeping the baby close to the breast. BF brochure given with resources for support after DC. No questions at present. To call for assist prn,.  Patient Name: Nancy Danniela Mcbrearty [id number] Date: 11/06/2012 Reason for consult: Initial assessment   Maternal Data Formula Feeding for Exclusion: No Infant to breast within first hour of birth: Yes Does the patient have breastfeeding experience prior to this delivery?: Yes  Feeding Feeding Type: Breast Milk Length of feed: 20 min  LATCH Score/Interventions                      Lactation Tools Discussed/Used     Consult Status Consult Status: Follow-up Date: 11/07/12 Follow-up type: In-patient    Pamelia Hoit 11/06/2012, 2:09 PM

## 2012-11-06 NOTE — Progress Notes (Signed)
UR chart review completed.  

## 2012-11-06 NOTE — Anesthesia Postprocedure Evaluation (Signed)
Anesthesia Post Note  Patient: Nancy Francis  Procedure(s) Performed: * No procedures listed *  Anesthesia type: Epidural  Patient location: Mother/Baby  Post pain: Pain level controlled  Post assessment: Post-op Vital signs reviewed  Last Vitals:  Filed Vitals:   11/06/12 0400  BP: 125/80  Pulse: 98  Temp: 36.6 C  Resp: 20    Post vital signs: Reviewed  Level of consciousness:alert  Complications: No apparent anesthesia complications

## 2012-11-07 NOTE — Discharge Summary (Signed)
Obstetric Discharge Summary Reason for Admission: onset of labor Prenatal Procedures: none Intrapartum Procedures: spontaneous vaginal delivery Postpartum Procedures: none Complications-Operative and Postpartum: none Hemoglobin  Date Value Range Status  11/05/2012 10.7* 12.0 - 15.0 g/dL Final     HCT  Date Value Range Status  11/05/2012 32.6* 36.0 - 46.0 % Final    Physical Exam:  General: alert, cooperative and no distress Lochia: appropriate Uterine Fundus: firm Incision: NA DVT Evaluation: No evidence of DVT seen on physical exam.  Discharge Diagnoses: Term Pregnancy-delivered  Discharge Information: Date: 11/07/2012 Activity: unrestricted Diet: routine Medications: Ibuprofen Condition: stable Instructions: refer to practice specific booklet Discharge to: home Follow-up Information   Follow up with [facility] [place] In 6 weeks.   Contact information:   [address] [place] [address] [phone]      Newborn Data: Live born female  Birth Weight: 6 lb 9.8 oz (3000 g) APGAR: 9, 9  Home with mother.  Tawnya Crook 11/07/2012, 8:00 AM

## 2012-11-08 NOTE — Discharge Summary (Signed)
Attestation of Attending Supervision of Advanced Practitioner (CNM/NP): Evaluation and management procedures were performed by the Advanced Practitioner under my supervision and collaboration. I have reviewed the Advanced Practitioner's note and chart, and I agree with the management and plan.  Eliel Dudding H. 9:28 PM

## 2012-12-04 ENCOUNTER — Encounter (HOSPITAL_COMMUNITY): Payer: Self-pay | Admitting: Emergency Medicine

## 2012-12-04 ENCOUNTER — Emergency Department (HOSPITAL_COMMUNITY)
Admission: EM | Admit: 2012-12-04 | Discharge: 2012-12-04 | Disposition: A | Discharge status: Home / Self Care | Payer: Medicaid Other | Attending: Emergency Medicine | Admitting: Emergency Medicine

## 2012-12-04 DIAGNOSIS — Z3202 Encounter for pregnancy test, result negative: Secondary | ICD-10-CM | POA: Insufficient documentation

## 2012-12-04 DIAGNOSIS — Z8659 Personal history of other mental and behavioral disorders: Secondary | ICD-10-CM | POA: Insufficient documentation

## 2012-12-04 DIAGNOSIS — Z880 Allergy status to penicillin: Secondary | ICD-10-CM | POA: Insufficient documentation

## 2012-12-04 DIAGNOSIS — R350 Frequency of micturition: Secondary | ICD-10-CM | POA: Insufficient documentation

## 2012-12-04 DIAGNOSIS — O26899 Other specified pregnancy related conditions, unspecified trimester: Secondary | ICD-10-CM | POA: Insufficient documentation

## 2012-12-04 DIAGNOSIS — Z87891 Personal history of nicotine dependence: Secondary | ICD-10-CM | POA: Insufficient documentation

## 2012-12-04 DIAGNOSIS — R300 Dysuria: Secondary | ICD-10-CM | POA: Insufficient documentation

## 2012-12-04 DIAGNOSIS — Z79899 Other long term (current) drug therapy: Secondary | ICD-10-CM | POA: Insufficient documentation

## 2012-12-04 DIAGNOSIS — O9081 Anemia of the puerperium: Secondary | ICD-10-CM | POA: Insufficient documentation

## 2012-12-04 LAB — URINALYSIS, ROUTINE W REFLEX MICROSCOPIC
Glucose, UA: NEGATIVE mg/dL
Leukocytes, UA: NEGATIVE
Protein, ur: NEGATIVE mg/dL
Specific Gravity, Urine: 1.016 (ref 1.005–1.030)
Urobilinogen, UA: 1 mg/dL (ref 0.0–1.0)

## 2012-12-04 LAB — POCT PREGNANCY, URINE: Preg Test, Ur: NEGATIVE

## 2012-12-04 MED ORDER — PHENAZOPYRIDINE HCL 200 MG PO TABS: 200.0000 mg | ORAL_TABLET | Freq: Three times a day (TID) | ORAL | Status: DC | PRN

## 2012-12-04 NOTE — ED Notes (Signed)
Pt c/o dysuria today; pt sts 4 weeks post partum

## 2012-12-04 NOTE — ED Provider Notes (Signed)
CSN: [id number]     Arrival date & time 12/04/12  1405 History   First MD Initiated Contact with Patient 12/04/12 1418     Chief Complaint  Patient presents with  . Urinary Tract Infection  . Dysuria   (Consider location/radiation/quality/duration/timing/severity/associated sxs/prior Treatment) Patient is a 32 y.o. female presenting with dysuria. The history is provided by the patient and medical records.  Dysuria  Patient presents to the ED for dysuria and increased urinary frequency present since this morning upon waking. Pt is approximately 4 weeks postpartum, delivery was without complication.  Pt denies any vaginal discharge, new sexual partners, or concern for STD.  No abdominal pain, N/V/D.  No flank pain, fevers, sweats, or chills.  No meds taken PTA.  Pt does have FU with her OB-GYN next week.  Med records reviewed-- Gc/Chl, RPR, HIV, GBS performed by OB-GYN in April of this year, all negative.  Past Medical History  Diagnosis Date  . Sickle cell trait   . Anemia   . Anxiety   . Depression   . Abnormal Pap smear 2010    repeat was negative   Past Surgical History  Procedure Laterality Date  . Fracture surgery    . Colposcopy     History reviewed. No pertinent family history. History  Substance Use Topics  . Smoking status: Former Games developer  . Smokeless tobacco: Not on file  . Alcohol Use: Yes     Comment: NONE WHILE PREG   OB History   Grav Para Term Preterm Abortions TAB SAB Ect Mult Living   4 4 4  0 0 0 0 0 0 4     Review of Systems  Genitourinary: Positive for dysuria and frequency.  All other systems reviewed and are negative.    Allergies  Penicillins  Home Medications   Current Outpatient Rx  Name  Route  Sig  Dispense  Refill  . Prenatal Vit-Fe Fumarate-FA (PRENATAL MULTIVITAMIN) TABS   Oral   Take 1 tablet by mouth daily at 12 noon.          BP 120/88  Pulse 83  Temp(Src) 98.4 F (36.9 C)  Resp 16  SpO2 97%  Physical Exam  Nursing  note and vitals reviewed. Constitutional: She is oriented to person, place, and time. She appears well-developed and well-nourished. No distress.  HENT:  Head: Normocephalic and atraumatic.  Eyes: Conjunctivae and EOM are normal. Pupils are equal, round, and reactive to light.  Neck: Normal range of motion. Neck supple.  Cardiovascular: Normal rate, regular rhythm and normal heart sounds.   Pulmonary/Chest: Effort normal and breath sounds normal. No respiratory distress. She has no wheezes.  Abdominal: Soft. Bowel sounds are normal. There is no tenderness. There is no guarding.  Musculoskeletal: Normal range of motion. She exhibits no edema.  Neurological: She is alert and oriented to person, place, and time.  Skin: Skin is warm and dry. She is not diaphoretic.  Psychiatric: She has a normal mood and affect.    ED Course  Procedures (including critical care time) Labs Review Labs Reviewed  URINALYSIS, ROUTINE W REFLEX MICROSCOPIC  POCT PREGNANCY, URINE   Imaging Review No results found.  MDM   1. Dysuria     u-preg negative.  U/a without signs of infection.  Suggested pelvic exam for further evaluation of dysuria, pt declined stating she is certain it is not a bacterial infection.  Has FU with her OB-GYN next week and will talk with her if no improvement.  Rx pyridium.  Discussed plan with pt, she agreed.  Return precautions advised.  Garlon Hatchet, PA-C 12/04/12 1507

## 2012-12-06 ENCOUNTER — Emergency Department (HOSPITAL_COMMUNITY): Payer: Medicaid Other

## 2012-12-06 ENCOUNTER — Encounter (HOSPITAL_COMMUNITY): Payer: Self-pay | Admitting: Emergency Medicine

## 2012-12-06 ENCOUNTER — Inpatient Hospital Stay (HOSPITAL_COMMUNITY)
Admission: EM | Admit: 2012-12-06 | Discharge: 2012-12-07 | DRG: 176 | Disposition: A | Discharge status: Home / Self Care | Payer: Medicaid Other | Attending: Internal Medicine | Admitting: Internal Medicine

## 2012-12-06 DIAGNOSIS — F411 Generalized anxiety disorder: Secondary | ICD-10-CM | POA: Diagnosis present

## 2012-12-06 DIAGNOSIS — F329 Major depressive disorder, single episode, unspecified: Secondary | ICD-10-CM | POA: Diagnosis present

## 2012-12-06 DIAGNOSIS — Z880 Allergy status to penicillin: Secondary | ICD-10-CM

## 2012-12-06 DIAGNOSIS — I2699 Other pulmonary embolism without acute cor pulmonale: Principal | ICD-10-CM | POA: Diagnosis present

## 2012-12-06 DIAGNOSIS — F3289 Other specified depressive episodes: Secondary | ICD-10-CM | POA: Diagnosis present

## 2012-12-06 DIAGNOSIS — R0600 Dyspnea, unspecified: Secondary | ICD-10-CM | POA: Diagnosis present

## 2012-12-06 DIAGNOSIS — Z79899 Other long term (current) drug therapy: Secondary | ICD-10-CM

## 2012-12-06 DIAGNOSIS — D573 Sickle-cell trait: Secondary | ICD-10-CM | POA: Diagnosis present

## 2012-12-06 DIAGNOSIS — R079 Chest pain, unspecified: Secondary | ICD-10-CM | POA: Diagnosis present

## 2012-12-06 DIAGNOSIS — Z87891 Personal history of nicotine dependence: Secondary | ICD-10-CM

## 2012-12-06 DIAGNOSIS — R0609 Other forms of dyspnea: Secondary | ICD-10-CM

## 2012-12-06 LAB — BASIC METABOLIC PANEL
CO2: 24 mEq/L (ref 19–32)
Chloride: 104 mEq/L (ref 96–112)
GFR calc non Af Amer: >90 mL/min (ref >90)
Glucose, Bld: 94 mg/dL (ref 70–99)
Potassium: 3.9 mEq/L (ref 3.5–5.1)
Sodium: 140 mEq/L (ref 135–145)

## 2012-12-06 LAB — CBC
MCHC: 34 g/dL (ref 30.0–36.0)
RDW: 14.3 % (ref 11.5–15.5)
WBC: 6.5 10*3/uL (ref 4.0–10.5)

## 2012-12-06 LAB — POCT I-STAT TROPONIN I: Troponin i, poc: 0.01 ng/mL (ref 0.00–0.08)

## 2012-12-06 LAB — MRSA PCR SCREENING: MRSA by PCR: NEGATIVE

## 2012-12-06 LAB — D-DIMER, QUANTITATIVE: D-Dimer, Quant: 2.43 ug/mL-FEU — ABNORMAL HIGH (ref 0.00–0.48)

## 2012-12-06 MED ORDER — SODIUM CHLORIDE 0.9 % IV SOLN: 250.0000 mL | INTRAVENOUS | Status: DC | PRN

## 2012-12-06 MED ORDER — ACETAMINOPHEN 325 MG PO TABS
650.0000 mg | ORAL_TABLET | Freq: Once | ORAL | Status: AC
Start: 2012-12-06 — End: 2012-12-06
  Administered 2012-12-06: 650 mg via ORAL
  Filled 2012-12-06: qty 2

## 2012-12-06 MED ORDER — HEPARIN BOLUS VIA INFUSION
2000.0000 [IU] | Freq: Once | INTRAVENOUS | Status: AC
  Administered 2012-12-06: 2000 [IU] via INTRAVENOUS
  Filled 2012-12-06: qty 2000

## 2012-12-06 MED ORDER — ONDANSETRON HCL 4 MG PO TABS: 4.0000 mg | ORAL_TABLET | Freq: Four times a day (QID) | ORAL | Status: DC | PRN

## 2012-12-06 MED ORDER — ACETAMINOPHEN 650 MG RE SUPP: 650.0000 mg | Freq: Four times a day (QID) | RECTAL | Status: DC | PRN

## 2012-12-06 MED ORDER — HYDROMORPHONE HCL PF 1 MG/ML IJ SOLN
1.0000 mg | Freq: Once | INTRAMUSCULAR | Status: AC
  Administered 2012-12-06: 1 mg via INTRAVENOUS
  Filled 2012-12-06: qty 1

## 2012-12-06 MED ORDER — LORAZEPAM 0.5 MG PO TABS
0.5000 mg | ORAL_TABLET | Freq: Two times a day (BID) | ORAL | Status: DC | PRN
  Administered 2012-12-06 – 2012-12-07 (×2): 0.5 mg via ORAL
  Filled 2012-12-06 (×2): qty 1

## 2012-12-06 MED ORDER — SODIUM CHLORIDE 0.9 % IJ SOLN: 3.0000 mL | INTRAMUSCULAR | Status: DC | PRN

## 2012-12-06 MED ORDER — HYDROCODONE-ACETAMINOPHEN 5-325 MG PO TABS
1.0000 | ORAL_TABLET | ORAL | Status: DC | PRN
  Administered 2012-12-06 (×2): 1 via ORAL
  Administered 2012-12-07 (×4): 2 via ORAL
  Filled 2012-12-06: qty 2
  Filled 2012-12-06 (×2): qty 1
  Filled 2012-12-06 (×3): qty 2

## 2012-12-06 MED ORDER — ONDANSETRON HCL 4 MG/2ML IJ SOLN: 4.0000 mg | Freq: Four times a day (QID) | INTRAMUSCULAR | Status: DC | PRN

## 2012-12-06 MED ORDER — HEPARIN (PORCINE) IN NACL 100-0.45 UNIT/ML-% IJ SOLN
1600.0000 [IU]/h | INTRAMUSCULAR | Status: DC
  Administered 2012-12-06: 1300 [IU]/h via INTRAVENOUS
  Filled 2012-12-06 (×3): qty 250

## 2012-12-06 MED ORDER — IOHEXOL 350 MG/ML SOLN
100.0000 mL | Freq: Once | INTRAVENOUS | Status: AC | PRN
  Administered 2012-12-06: 100 mL via INTRAVENOUS

## 2012-12-06 MED ORDER — PRENATAL MULTIVITAMIN CH
1.0000 | ORAL_TABLET | Freq: Every day | ORAL | Status: DC
  Administered 2012-12-07: 1 via ORAL
  Filled 2012-12-06: qty 1

## 2012-12-06 MED ORDER — SODIUM CHLORIDE 0.9 % IJ SOLN: 3.0000 mL | Freq: Two times a day (BID) | INTRAMUSCULAR | Status: DC

## 2012-12-06 MED ORDER — KETOROLAC TROMETHAMINE 60 MG/2ML IM SOLN
60.0000 mg | Freq: Once | INTRAMUSCULAR | Status: AC
  Administered 2012-12-06: 60 mg via INTRAMUSCULAR
  Filled 2012-12-06: qty 2

## 2012-12-06 MED ORDER — ACETAMINOPHEN 325 MG PO TABS: 650.0000 mg | ORAL_TABLET | Freq: Four times a day (QID) | ORAL | Status: DC | PRN

## 2012-12-06 MED ORDER — IBUPROFEN 800 MG PO TABS: 800.0000 mg | ORAL_TABLET | Freq: Once | ORAL | Status: DC

## 2012-12-06 NOTE — ED Provider Notes (Signed)
CSN: [id number]     Arrival date & time 12/06/12  1411 History   First MD Initiated Contact with Patient 12/06/12 1558     Chief Complaint  Patient presents with  . Shortness of Breath   (Consider location/radiation/quality/duration/timing/severity/associated sxs/prior Treatment) HPI Comments: 32 year old female with a past medical history of anemia, anxiety and depression presents emergency department complaining of left-sided chest pain and shortness of breath beginning around 7:00 PM last night. Patient states she was just sitting when the pain began, described as sharp and stabbing rated 10 out of 10. She initially thought it was gas, took Gas-X without relief. Pain worse with deep inspiration and certain movements. Shortness of breath is both at rest and on exertion. She gave birth 4 weeks ago, had a normal vaginal delivery without any complications. She is not on any birth control pills. No family history of blood clots. Denies nausea, vomiting, lightheadedness or dizziness.  Patient is a 32 y.o. female presenting with shortness of breath. The history is provided by the patient.  Shortness of Breath Associated symptoms: chest pain   Associated symptoms: no abdominal pain, no cough, no diaphoresis, no fever, no headaches, no rash, no vomiting and no wheezing     Past Medical History  Diagnosis Date  . Sickle cell trait   . Anemia   . Anxiety   . Depression   . Abnormal Pap smear 2010    repeat was negative   Past Surgical History  Procedure Laterality Date  . Fracture surgery    . Colposcopy     History reviewed. No pertinent family history. History  Substance Use Topics  . Smoking status: Former Games developer  . Smokeless tobacco: Not on file  . Alcohol Use: Yes     Comment: NONE WHILE PREG   OB History   Grav Para Term Preterm Abortions TAB SAB Ect Mult Living   4 4 4  0 0 0 0 0 0 4     Review of Systems  Constitutional: Negative for fever, chills and diaphoresis.    Respiratory: Positive for shortness of breath. Negative for cough, chest tightness and wheezing.   Cardiovascular: Positive for chest pain.  Gastrointestinal: Negative for nausea, vomiting and abdominal pain.  Genitourinary: Negative.   Skin: Negative for color change and rash.  Neurological: Negative for dizziness, light-headedness and headaches.  All other systems reviewed and are negative.    Allergies  Penicillins  Home Medications   Current Outpatient Rx  Name  Route  Sig  Dispense  Refill  . Prenatal Vit-Fe Fumarate-FA (PRENATAL MULTIVITAMIN) TABS   Oral   Take 1 tablet by mouth daily at 12 noon.         . Simethicone (GAS-X PO)   Oral   Take 1 tablet by mouth once as needed (for gas and upset stomach).          BP 122/65  Pulse 106  Temp(Src) 99.1 F (37.3 C) (Oral)  Resp 22  SpO2 100% Physical Exam  Nursing note and vitals reviewed. Constitutional: She is oriented to person, place, and time. She appears well-developed and well-nourished. No distress.  HENT:  Head: Normocephalic and atraumatic.  Mouth/Throat: Oropharynx is clear and moist.  Eyes: Conjunctivae and EOM are normal. Pupils are equal, round, and reactive to light.  Neck: Normal range of motion. Neck supple.  Cardiovascular: Regular rhythm, normal heart sounds, intact distal pulses and normal pulses.  Tachycardia present.   Pulmonary/Chest: Breath sounds normal. Tachypnea noted. No respiratory  distress. She has no decreased breath sounds. She has no wheezes. She has no rhonchi. She has no rales. She exhibits tenderness.    Abdominal: Soft. Bowel sounds are normal. There is no tenderness.  Musculoskeletal: Normal range of motion. She exhibits no edema.  Neurological: She is alert and oriented to person, place, and time. She has normal strength. No sensory deficit.  Skin: Skin is warm and dry. No rash noted. She is not diaphoretic.  Psychiatric: She has a normal mood and affect. Her behavior is  normal.    ED Course  Procedures (including critical care time) Labs Review Labs Reviewed  D-DIMER, QUANTITATIVE - Abnormal; Notable for the following:    D-Dimer, Quant 2.43 (*)    All other components within normal limits  CBC  BASIC METABOLIC PANEL  POCT I-STAT TROPONIN I    Date: 12/06/2012  Rate: 101  Rhythm: sinus tachycardia  QRS Axis: normal  Intervals: normal  ST/T Wave abnormalities: normal  Conduction Disutrbances:none  Narrative Interpretation: sinus tachy, no stemi  Old EKG Reviewed: none available   Imaging Review Dg Chest 2 View  12/06/2012   CLINICAL DATA:  Pain  EXAM: CHEST  2 VIEW  COMPARISON:  None.  FINDINGS: There is minimal scarring in the lateral right base. Lungs are otherwise clear. Heart size and pulmonary vascularity are normal. No adenopathy. No pneumothorax. No bone lesions. There is upper thoracic levoscoliosis.  IMPRESSION: No edema or consolidation. Minimal scarring right base.   Electronically Signed   By: Bretta Bang   On: 12/06/2012 14:57   Ct Angio Chest Pe W/cm &/or Wo Cm  12/06/2012   CLINICAL DATA:  Left chest and back pain and shortness of breath. Elevated D-dimer. Status post vaginal delivery on 11/06/2012.  EXAM: CT ANGIOGRAPHY CHEST WITH CONTRAST  TECHNIQUE: Multidetector CT imaging of the chest was performed using the standard protocol during bolus administration of intravenous contrast. Multiplanar CT image reconstructions including MIPs were obtained to evaluate the vascular anatomy.  CONTRAST:  OMNIPAQUE IOHEXOL 350 MG/ML SOLN  COMPARISON:  Chest radiographs obtained earlier today.  FINDINGS: Small number of small to moderate-sized filling defects in the lower lobe pulmonary arteries bilaterally. Mild atelectasis at both lung bases. No lung masses. Borderline enlarged left upper mediastinal pre-vascular lymph node with a short axis diameter of 8 mm on image number 73. Otherwise, no enlarged lymph nodes are seen. Unremarkable  bones and upper abdomen.  Review of the MIP images confirms the above findings.  IMPRESSION: Small number of small to moderate-sized bilateral pulmonary emboli, as described above.  Critical Value/emergent results were called by telephone at the time of interpretation on 12/06/2012 at 7:00 PMto [facility] , who verbally acknowledged these results.   Electronically Signed   By: Gordan Payment   On: 12/06/2012 19:05    MDM   1. Pulmonary emboli     Patient with left-sided chest pain and shortness of breath, sudden onset. On exam she appears uncomfortable, tachycardic and tachypneic. Labs obtained triage prior to patient being seen, CBC, BMP and troponin which were normal. Chest x-ray normal. Chest pain reproducible, however given that she just gave birth, tachycardic and tachypneic I cannot rule out PE. D-dimer obtained and was elevated. Will obtain CT angiogram chest for further evaluation. O2 sat 100% on room air. 7:24 PM CT scan showing bilateral pulmonary emboli. She will be started on a heparin drip. She is no longer bleeding post vaginal delivery. She will be admitted to the  hospital. Admission excepted by Dr. Onalee Hua, [facility]. Vitals remained stable, slightly tachycardic and tachypneic. She is not hypotensive and has a normal oxygen saturation. Case discussed with my attending Dr. Romeo Apple who agrees with plan of care.   Trevor Mace, PA-C 12/06/12 1926

## 2012-12-06 NOTE — Progress Notes (Signed)
ANTICOAGULATION CONSULT NOTE - Initial Consult  Pharmacy Consult for heparin Indication: pulmonary embolus  Allergies  Allergen Reactions  . Penicillins Hives    Patient Measurements: weight 79 kg, height 64 inches (per patient)   Heparin Dosing Weight: 71 kg  Vital Signs: Temp: 99.1 F (37.3 C) (09/25 1416) Temp src: Oral (09/25 1416) BP: 122/65 mmHg (09/25 1414) Pulse Rate: 106 (09/25 1414)  Labs:  Recent Labs  12/06/12 1418  HGB 13.2  HCT 38.8  PLT 208  CREATININE 0.82    The CrCl is unknown because both a height and weight (above a minimum accepted value) are required for this calculation.   Medical History: Past Medical History  Diagnosis Date  . Sickle cell trait   . Anemia   . Anxiety   . Depression   . Abnormal Pap smear 2010    repeat was negative    Medications:  See med rec  Assessment: Patient is a 32 y.o F presented to the ED with c/o SOB. She reported that she gave birth (vaginal delivery) about 4 weeks ago.  "Small to moderate-sized bilateral pulmonary emboli" noted on chest CT angio.  To start anticoagulation for PE.  Goal of Therapy:  Heparin level 0.3-0.7 units/ml Monitor platelets by anticoagulation protocol: Yes   Plan:  1) heparin 2000 units IV bolus, then drip at 1300 units/hr (based on [place] method) 2) check 6 hour heparin level  Jlen Wintle P 12/06/2012,7:07 PM

## 2012-12-06 NOTE — ED Notes (Signed)
Pt c/o left sided pain with inspiration and SOB starting today

## 2012-12-06 NOTE — H&P (Signed)
Chief Complaint:  Cp and sob  HPI: 32 yo female s/p SVD last month, normal healthy uneventful pregnancy and delivery of her 4th child started having left sided pleuritic chest pain last night about 7pm.  She went to bed and woke up around 3am with chest pain and started having sob.  No fevers.  No le edema or swelling.  Was breast feeding but stopped over a week ago.  Only pmhx is sickle cell trait.  No prior h/o vte.  Chest pain is better now.  No dizziness or syncope.  Cta shows bilateral PE.  Review of Systems:  Positive and negative as per HPI otherwise all other systems are negative  Past Medical History: Past Medical History  Diagnosis Date  . Sickle cell trait   . Anemia   . Anxiety   . Depression   . Abnormal Pap smear 2010    repeat was negative   Past Surgical History  Procedure Laterality Date  . Fracture surgery    . Colposcopy      Medications: Prior to Admission medications   Medication Sig Start Date End Date Taking? Authorizing Provider  Prenatal Vit-Fe Fumarate-FA (PRENATAL MULTIVITAMIN) TABS Take 1 tablet by mouth daily at 12 noon.   Yes Historical Provider, MD  Simethicone (GAS-X PO) Take 1 tablet by mouth once as needed (for gas and upset stomach).   Yes Historical Provider, MD    Allergies:   Allergies  Allergen Reactions  . Penicillins Hives    Social History:  reports that she has quit smoking. She does not have any smokeless tobacco history on file. She reports that  drinks alcohol. She reports that she does not use illicit drugs.  Family History: History reviewed. No pertinent family history.  Physical Exam: Filed Vitals:   12/06/12 1414 12/06/12 1416  BP: 122/65   Pulse: 106   Temp:  99.1 F (37.3 C)  TempSrc: Oral Oral  Resp: 22   SpO2: 100%    General appearance: alert, cooperative and no distress Head: Normocephalic, without obvious abnormality, atraumatic Eyes: negative Nose: Nares normal. Septum midline. Mucosa normal. No  drainage or sinus tenderness. Neck: no JVD and supple, symmetrical, trachea midline Lungs: clear to auscultation bilaterally Heart: regular rate and rhythm, S1, S2 normal, no murmur, click, rub or gallop Abdomen: soft, non-tender; bowel sounds normal; no masses,  no organomegaly Extremities: extremities normal, atraumatic, no cyanosis or edema Pulses: 2+ and symmetric Skin: Skin color, texture, turgor normal. No rashes or lesions Neurologic: Grossly normal    Labs on Admission:   Recent Labs  12/06/12 1418  NA 140  K 3.9  CL 104  CO2 24  GLUCOSE 94  BUN 9  CREATININE 0.82  CALCIUM 9.2    Recent Labs  12/06/12 1418  WBC 6.5  HGB 13.2  HCT 38.8  MCV 90.7  PLT 208    Radiological Exams on Admission: Dg Chest 2 View  12/06/2012   CLINICAL DATA:  Pain  EXAM: CHEST  2 VIEW  COMPARISON:  None.  FINDINGS: There is minimal scarring in the lateral right base. Lungs are otherwise clear. Heart size and pulmonary vascularity are normal. No adenopathy. No pneumothorax. No bone lesions. There is upper thoracic levoscoliosis.  IMPRESSION: No edema or consolidation. Minimal scarring right base.   Electronically Signed   By: Bretta Bang   On: 12/06/2012 14:57   Ct Angio Chest Pe W/cm &/or Wo Cm  12/06/2012   CLINICAL DATA:  Left chest and  back pain and shortness of breath. Elevated D-dimer. Status post vaginal delivery on 11/06/2012.  EXAM: CT ANGIOGRAPHY CHEST WITH CONTRAST  TECHNIQUE: Multidetector CT imaging of the chest was performed using the standard protocol during bolus administration of intravenous contrast. Multiplanar CT image reconstructions including MIPs were obtained to evaluate the vascular anatomy.  CONTRAST:  OMNIPAQUE IOHEXOL 350 MG/ML SOLN  COMPARISON:  Chest radiographs obtained earlier today.  FINDINGS: Small number of small to moderate-sized filling defects in the lower lobe pulmonary arteries bilaterally. Mild atelectasis at both lung bases. No lung masses.  Borderline enlarged left upper mediastinal pre-vascular lymph node with a short axis diameter of 8 mm on image number 73. Otherwise, no enlarged lymph nodes are seen. Unremarkable bones and upper abdomen.  Review of the MIP images confirms the above findings.  IMPRESSION: Small number of small to moderate-sized bilateral pulmonary emboli, as described above.  Critical Value/emergent results were called by telephone at the time of interpretation on 12/06/2012 at 7:00 PMto [facility] , who verbally acknowledged these results.   Electronically Signed   By: Gordan Payment   On: 12/06/2012 19:05    Assessment/Plan  32 yo female with recent spontaneous vaginal delivery comes in with pleuritic chest pain and dyspnea found to have moderate sized bilateral pulmonary emboli Principal Problem:   Bilateral pulmonary embolism Active Problems:   Sickle cell trait   Acute dyspnea   Chest pain   SVD (spontaneous vaginal delivery) 08/14  Hep gtt.  Place in stepdown overnight.  Mildly tachycardic and tachypneic but oxygen sats are 100% on RA.  Has medicaid only, will not start coumadin as she may be able to take xaralto.  Recommend a year of treatment.  Not breast feeding.  Full code.  Wallace Cogliano A 12/06/2012, 7:25 PM

## 2012-12-06 NOTE — ED Notes (Signed)
Attempted report 

## 2012-12-07 ENCOUNTER — Encounter (HOSPITAL_COMMUNITY): Payer: Self-pay | Admitting: *Deleted

## 2012-12-07 LAB — CBC
HCT: 36 % (ref 36.0–46.0)
Hemoglobin: 12 g/dL (ref 12.0–15.0)
MCH: 30.5 pg (ref 26.0–34.0)
MCHC: 33.3 g/dL (ref 30.0–36.0)
MCV: 91.6 fL (ref 78.0–100.0)
WBC: 5.7 10*3/uL (ref 4.0–10.5)

## 2012-12-07 LAB — PROTIME-INR: INR: 1.05 (ref 0.00–1.49)

## 2012-12-07 LAB — HEPARIN LEVEL (UNFRACTIONATED): Heparin Unfractionated: 1.4 IU/mL — ABNORMAL HIGH (ref 0.30–0.70)

## 2012-12-07 MED ORDER — HEPARIN BOLUS VIA INFUSION
3000.0000 [IU] | Freq: Once | INTRAVENOUS | Status: AC
  Administered 2012-12-07: 3000 [IU] via INTRAVENOUS
  Filled 2012-12-07: qty 3000

## 2012-12-07 MED ORDER — RIVAROXABAN 20 MG PO TABS: 20.0000 mg | ORAL_TABLET | Freq: Every day | ORAL | Status: DC

## 2012-12-07 MED ORDER — RIVAROXABAN 15 MG PO TABS
15.0000 mg | ORAL_TABLET | Freq: Two times a day (BID) | ORAL | Status: DC
  Administered 2012-12-07: 15 mg via ORAL
  Filled 2012-12-07 (×2): qty 1

## 2012-12-07 MED ORDER — PNEUMOCOCCAL VAC POLYVALENT 25 MCG/0.5ML IJ INJ: 0.5000 mL | INJECTION | INTRAMUSCULAR | Status: DC

## 2012-12-07 MED ORDER — HYDROCODONE-ACETAMINOPHEN 5-325 MG PO TABS: 1.0000 | ORAL_TABLET | ORAL | Status: DC | PRN

## 2012-12-07 MED ORDER — RIVAROXABAN 15 MG PO TABS: 15.0000 mg | ORAL_TABLET | Freq: Two times a day (BID) | ORAL | Status: DC

## 2012-12-07 NOTE — Care Management Note (Signed)
    Page 1 of 1   12/07/2012     11:46:55 AM   CARE MANAGEMENT NOTE 12/07/2012  Patient:  Nancy Francis, Nancy Francis   Account Number:  [id number]  Date Initiated:  12/07/2012  Documentation initiated by:  Donn Pierini  Subjective/Objective Assessment:   Pt admitted with SOB- + for bil PEs     Action/Plan:   PTA Pt lived at home with spouse and children- recent birth of baby 4wks ago-   Anticipated DC Date:  12/10/2012   Anticipated DC Plan:  HOME/SELF CARE      DC Planning Services  CM consult      Choice offered to / List presented to:             Status of service:  In process, will continue to follow Medicare Important Message given?   (If response is "NO", the following Medicare IM given date fields will be blank) Date Medicare IM given:   Date Additional Medicare IM given:    Discharge Disposition:    Per UR Regulation:  Reviewed for med. necessity/level of care/duration of stay  If discussed at Long Length of Stay Meetings, dates discussed:    Comments:  12/07/12- 1140- Donn Pierini RN, BSN [phone] Referral for Xarelto received- pt has Medicaid and Xarelto is on the preferred drug list and does not require a pre-auth- copay is at the regular medicaid copay cost.

## 2012-12-07 NOTE — Discharge Summary (Signed)
Physician Discharge Summary  Nancy Francis WUJ:[id number] DOB: Aug 18, 1980 DOA: 12/06/2012  PCP: No PCP Per Patient  Admit date: 12/06/2012 Discharge date: 12/07/2012  Time spent: 30 minutes  Recommendations for Outpatient Follow-up:  1. Please call the number on your Medicaid card so you can establish with a general medical doctor who can follow the new diagnosis of pulmonary embolus.  Discharge Diagnoses:  Principal Problem:   Bilateral pulmonary embolism Active Problems:   Sickle cell trait   Acute dyspnea due to PE- resolved   Chest pain due to PE   SVD (spontaneous vaginal delivery) 08/14   Discharge Condition: stable  Diet recommendation: Regular  Filed Weights   12/06/12 2040  Weight: 79.4 kg (175 lb 0.7 oz)    History of present illness:  32 yo female s/p SVD last month, normal healthy uneventful pregnancy and delivery of her 4th child started having left sided pleuritic chest pain last night about 7pm. She went to bed and woke up around 3am with chest pain taht was associated with SOB. No fevers. No LE edema or swelling. Was breast feeding but stopped over a week ago. Only pmhx is sickle cell trait. No prior h/o DVT or PE. Chest pain had improved by the time the attending arrived to bedside to admit. No dizziness or syncope. CT angio of chest showed bilateral PE.   Hospital Course:  Acute bilateral pulmonary embolus -remained stable without hypoxia -started on IV Heparin at admission- case management clarified pt eligible for Xarelto -still with residual pleuritic chest pain so will continue Vicodin after dc- no NSAIDs since on anticoagulation  -will need Xarelto x 6 months- since this is initial occurrence will not need lifelong anticoagulation-initial dose 15 mg BID for 21 days then 20 mg daily -instructed to establish with general medical MD after dc  Procedures:  None  Consultations:  None  Discharge Exam: Filed Vitals:   12/07/12 0723  BP: 109/92   Pulse: 77  Temp: 98.1 F (36.7 C)  Resp: 21   General: No acute respiratory distress Lungs: Clear to auscultation bilaterally without wheezes or crackles, RA Cardiovascular: Regular rate and rhythm without murmur gallop or rub normal S1 and S2, no peripheral edema or JVD Abdomen: Nontender, nondistended, soft, bowel sounds positive, no rebound, no ascites, no appreciable mass Musculoskeletal: No significant cyanosis, clubbing of bilateral lower extremities Neurological: Alert and oriented x 3, moves all extremities x 4 without focal neurological deficits, CN 2-12 intact   Discharge Instructions      Discharge Orders   Future Orders Complete By Expires   Call MD for:  difficulty breathing, headache or visual disturbances  As directed    Call MD for:  extreme fatigue  As directed    Call MD for:  persistant dizziness or light-headedness  As directed    Diet general  As directed    Discharge instructions  As directed    Comments:     Make sure you establish with a general medical doctor   Increase activity slowly  As directed        Medication List         GAS-X PO  Take 1 tablet by mouth once as needed (for gas and upset stomach).     HYDROcodone-acetaminophen 5-325 MG per tablet  Commonly known as:  NORCO/VICODIN  Take 1-2 tablets by mouth every 4 (four) hours as needed.     prenatal multivitamin Tabs tablet  Take 1 tablet by mouth daily at 12 noon.  Rivaroxaban 15 MG Tabs tablet  Commonly known as:  XARELTO  Take 1 tablet (15 mg total) by mouth 2 (two) times daily with a meal.     Rivaroxaban 20 MG Tabs tablet  Commonly known as:  XARELTO  Take 1 tablet (20 mg total) by mouth daily with breakfast.  Start taking on:  12/29/2012       Allergies  Allergen Reactions  . Penicillins Hives   Follow-up Information   Follow up with No PCP Per Patient. (Please call number on Medicaid card to find general medical doctor to follow the new diagnosis of pulmonary  embolus)    Specialty:  General Practice   Contact information:   [address] [place] [address] [phone]        The results of significant diagnostics from this hospitalization (including imaging, microbiology, ancillary and laboratory) are listed below for reference.    Significant Diagnostic Studies: Dg Chest 2 View  12/06/2012   CLINICAL DATA:  Pain  EXAM: CHEST  2 VIEW  COMPARISON:  None.  FINDINGS: There is minimal scarring in the lateral right base. Lungs are otherwise clear. Heart size and pulmonary vascularity are normal. No adenopathy. No pneumothorax. No bone lesions. There is upper thoracic levoscoliosis.  IMPRESSION: No edema or consolidation. Minimal scarring right base.   Electronically Signed   By: Bretta Bang   On: 12/06/2012 14:57   Ct Angio Chest Pe W/cm &/or Wo Cm  12/06/2012   CLINICAL DATA:  Left chest and back pain and shortness of breath. Elevated D-dimer. Status post vaginal delivery on 11/06/2012.  EXAM: CT ANGIOGRAPHY CHEST WITH CONTRAST  TECHNIQUE: Multidetector CT imaging of the chest was performed using the standard protocol during bolus administration of intravenous contrast. Multiplanar CT image reconstructions including MIPs were obtained to evaluate the vascular anatomy.  CONTRAST:  OMNIPAQUE IOHEXOL 350 MG/ML SOLN  COMPARISON:  Chest radiographs obtained earlier today.  FINDINGS: Small number of small to moderate-sized filling defects in the lower lobe pulmonary arteries bilaterally. Mild atelectasis at both lung bases. No lung masses. Borderline enlarged left upper mediastinal pre-vascular lymph node with a short axis diameter of 8 mm on image number 73. Otherwise, no enlarged lymph nodes are seen. Unremarkable bones and upper abdomen.  Review of the MIP images confirms the above findings.  IMPRESSION: Small number of small to moderate-sized bilateral pulmonary emboli, as described above.  Critical Value/emergent results were called by  telephone at the time of interpretation on 12/06/2012 at 7:00 PMto [facility] , who verbally acknowledged these results.   Electronically Signed   By: Gordan Payment   On: 12/06/2012 19:05    Microbiology: Recent Results (from the past 240 hour(s))  MRSA PCR SCREENING     Status: None   Collection Time    12/06/12  9:06 PM      Result Value Range Status   MRSA by PCR NEGATIVE  NEGATIVE Final   Comment:            The GeneXpert MRSA Assay (FDA     approved for NASAL specimens     only), is one component of a     comprehensive MRSA colonization     surveillance program. It is not     intended to diagnose MRSA     infection nor to guide or     monitor treatment for     MRSA infections.     Labs: Basic Metabolic Panel:  Recent Labs  Lab 12/06/12 1418  NA 140  K 3.9  CL 104  CO2 24  GLUCOSE 94  BUN 9  CREATININE 0.82  CALCIUM 9.2   Liver Function Tests: No results found for this basename: AST, ALT, ALKPHOS, BILITOT, PROT, ALBUMIN,  in the last 168 hours No results found for this basename: LIPASE, AMYLASE,  in the last 168 hours No results found for this basename: AMMONIA,  in the last 168 hours CBC:  Recent Labs Lab 12/06/12 1418 12/07/12 0245  WBC 6.5 5.7  HGB 13.2 12.0  HCT 38.8 36.0  MCV 90.7 91.6  PLT 208 186   Cardiac Enzymes:  Recent Labs Lab 12/06/12 2250 12/07/12 0245 12/07/12 1045  TROPONINI <0.30 <0.30 <0.30   BNP: BNP (last 3 results) No results found for this basename: PROBNP,  in the last 8760 hours CBG: No results found for this basename: GLUCAP,  in the last 168 hours     Signed:  ELLIS,ALLISON L. ANP  [facility] 12/07/2012, 2:46 PM    I have examined the patient, reviewed the chart and modified the above note which I agree with.   Khadejah Son,MD [phone] 12/07/2012, 6:53 PM

## 2012-12-07 NOTE — ED Provider Notes (Signed)
Medical screening examination/treatment/procedure(s) were performed by non-physician practitioner and as supervising physician I was immediately available for consultation/collaboration.   Junius Argyle, MD 12/07/12 1438

## 2012-12-07 NOTE — ED Provider Notes (Signed)
Medical screening examination/treatment/procedure(s) were performed by non-physician practitioner and as supervising physician I was immediately available for consultation/collaboration.  Toy Baker, MD 12/07/12 [phone]

## 2012-12-07 NOTE — Progress Notes (Signed)
Pt discharged home per MD order. All discharge instructions were reviewed and all questions answered.  

## 2012-12-07 NOTE — Progress Notes (Signed)
ANTICOAGULATION CONSULT NOTE - Follow Up Consult  Pharmacy Consult for heparin Indication: pulmonary embolus  Labs:  Recent Labs  12/06/12 1418 12/06/12 2250 12/07/12 0245  HGB 13.2  --  12.0  HCT 38.8  --  36.0  PLT 208  --  186  LABPROT  --   --  13.5  INR  --   --  1.05  HEPARINUNFRC  --   --  <0.10*  CREATININE 0.82  --   --   TROPONINI  --  <0.30 <0.30    Assessment: 32yo female undetectable on heparin with initial dosing for PE.  Goal of Therapy:  Heparin level 0.3-0.7 units/ml   Plan:  Will rebolus with heparin 3000 units x1 and increase gtt by 4 units/kg/hr to 1600 units/hr and check level in 6hr.  Vernard Gambles, PharmD, BCPS  12/07/2012,6:41 AM

## 2012-12-07 NOTE — Progress Notes (Addendum)
ANTICOAGULATION CONSULT NOTE - Follow Up Consult  Pharmacy Consult for Heparin --> Xarelto Indication: pulmonary embolus  Allergies  Allergen Reactions  . Penicillins Hives    Patient Measurements: Height: 5\' 4"  (162.6 cm) Weight: 175 lb 0.7 oz (79.4 kg) IBW/kg (Calculated) : 54.7 Heparin Dosing Weight:   Vital Signs: Temp: 98.1 F (36.7 C) (09/26 0723) Temp src: Oral (09/26 0723) BP: 109/92 mmHg (09/26 0723) Pulse Rate: 77 (09/26 0723)  Labs:  Recent Labs  12/06/12 1418 12/06/12 2250 12/07/12 0245 12/07/12 1045  HGB 13.2  --  12.0  --   HCT 38.8  --  36.0  --   PLT 208  --  186  --   LABPROT  --   --  13.5  --   INR  --   --  1.05  --   HEPARINUNFRC  --   --  <0.10*  --   CREATININE 0.82  --   --   --   TROPONINI  --  <0.30 <0.30 <0.30    Estimated Creatinine Clearance: 102.3 ml/min (by C-G formula based on Cr of 0.82).   Medications:  Scheduled:  . [START ON 12/08/2012] pneumococcal 23 valent vaccine  0.5 mL Intramuscular Tomorrow-1000  . prenatal multivitamin  1 tablet Oral Q1200  . sodium chloride  3 mL Intravenous Q12H    Assessment: 32yo female 4 weeks s/p vaginal delivery, now with PE.  SHe is not breastfeeding.  She is currently on heparin, with pending heparin level, now to change to Xarelto.  Hg 12 and pltc 186.  No bleeding problems noted.  Goal of Therapy:  Treatment of PE Monitor platelets by anticoagulation protocol: Yes   Plan:  1.  D/C Heparin & immediately start Xarelto, instructed RN. 2.  Xarelto 15mg  bid x 21 days, then 20mg  daily.  Give all doses with food  Nancy Francis, PharmD Clinical Pharmacist [facility]

## 2012-12-11 ENCOUNTER — Encounter (HOSPITAL_COMMUNITY): Payer: Self-pay | Admitting: Adult Health

## 2012-12-11 ENCOUNTER — Emergency Department (HOSPITAL_COMMUNITY)
Admission: EM | Admit: 2012-12-11 | Discharge: 2012-12-12 | Disposition: A | Discharge status: Home / Self Care | Payer: Medicaid Other | Attending: Emergency Medicine | Admitting: Emergency Medicine

## 2012-12-11 DIAGNOSIS — F17200 Nicotine dependence, unspecified, uncomplicated: Secondary | ICD-10-CM | POA: Insufficient documentation

## 2012-12-11 DIAGNOSIS — Z79899 Other long term (current) drug therapy: Secondary | ICD-10-CM | POA: Insufficient documentation

## 2012-12-11 DIAGNOSIS — Z880 Allergy status to penicillin: Secondary | ICD-10-CM | POA: Insufficient documentation

## 2012-12-11 DIAGNOSIS — Z8659 Personal history of other mental and behavioral disorders: Secondary | ICD-10-CM | POA: Insufficient documentation

## 2012-12-11 DIAGNOSIS — Z7901 Long term (current) use of anticoagulants: Secondary | ICD-10-CM | POA: Insufficient documentation

## 2012-12-11 DIAGNOSIS — K920 Hematemesis: Secondary | ICD-10-CM | POA: Insufficient documentation

## 2012-12-11 DIAGNOSIS — Z862 Personal history of diseases of the blood and blood-forming organs and certain disorders involving the immune mechanism: Secondary | ICD-10-CM | POA: Insufficient documentation

## 2012-12-11 DIAGNOSIS — Z86711 Personal history of pulmonary embolism: Secondary | ICD-10-CM | POA: Insufficient documentation

## 2012-12-11 HISTORY — DX: Other pulmonary embolism without acute cor pulmonale: I26.99

## 2012-12-11 LAB — CBC
MCH: 30.8 pg (ref 26.0–34.0)
MCHC: 33.7 g/dL (ref 30.0–36.0)
MCV: 91.6 fL (ref 78.0–100.0)
Platelets: 240 10*3/uL (ref 150–400)
RBC: 4.15 MIL/uL (ref 3.87–5.11)
RDW: 14.2 % (ref 11.5–15.5)
WBC: 6.2 10*3/uL (ref 4.0–10.5)

## 2012-12-11 LAB — PROTIME-INR
INR: 1.05 (ref 0.00–1.49)
Prothrombin Time: 13.5 seconds (ref 11.6–15.2)

## 2012-12-11 NOTE — ED Notes (Signed)
Presents post admission and discharge for bilateral PEs, d/c from [facility] on Friday, placed on Xarelto, this morning while at home reports "a glob of blood just came up out of my mouth. I didn't cough or throw up, it just came out" c/o epigastric pain and left side/chest pain worse with deep inspiration associated with fatigue, dizziness. Denies blood in stool.

## 2012-12-12 ENCOUNTER — Emergency Department (HOSPITAL_COMMUNITY): Payer: Medicaid Other

## 2012-12-12 LAB — COMPREHENSIVE METABOLIC PANEL
ALT: 22 U/L (ref 0–35)
AST: 21 U/L (ref 0–37)
Albumin: 3.6 g/dL (ref 3.5–5.2)
BUN: 12 mg/dL (ref 6–23)
Calcium: 9.5 mg/dL (ref 8.4–10.5)
Chloride: 104 mEq/L (ref 96–112)
Creatinine, Ser: 0.8 mg/dL (ref 0.50–1.10)
Total Bilirubin: 0.2 mg/dL — ABNORMAL LOW (ref 0.3–1.2)
Total Protein: 7.6 g/dL (ref 6.0–8.3)

## 2012-12-12 MED ORDER — ACETAMINOPHEN 325 MG PO TABS
650.0000 mg | ORAL_TABLET | Freq: Once | ORAL | Status: AC
  Administered 2012-12-12: 650 mg via ORAL
  Filled 2012-12-12: qty 2

## 2012-12-12 NOTE — ED Provider Notes (Signed)
CSN: [id number]     Arrival date & time 12/11/12  2304 History   First MD Initiated Contact with Patient 12/11/12 2340     Chief Complaint  Patient presents with  . Hematemesis   (Consider location/radiation/quality/duration/timing/severity/associated sxs/prior Treatment) HPI Comments: Nancy Francis is a 32 y.o. Female who was at home tonight Resting when she suddenly had to spit out blood from her mouth. She is unsure if it came from coughing or vomiting. She denies nosebleed mouth pain or mouth bleeding. She is anticoagulated with xarelto, for PE. She denies shortness of breath, chest pain, weakness, or dizziness. She recently delivered a child. She is not breast-feeding. There are no other known modifying factors.   The history is provided by the patient.    Past Medical History  Diagnosis Date  . Sickle cell trait   . Anemia   . Anxiety   . Depression   . Abnormal Pap smear 2010    repeat was negative  . PE (pulmonary thromboembolism)    Past Surgical History  Procedure Laterality Date  . Colposcopy    . Fracture surgery Right     3 surgeries   History reviewed. No pertinent family history. History  Substance Use Topics  . Smoking status: Current Some Day Smoker    Types: Cigarettes  . Smokeless tobacco: Not on file  . Alcohol Use: Yes     Comment: NONE WHILE PREG   OB History   Grav Para Term Preterm Abortions TAB SAB Ect Mult Living   4 4 4  0 0 0 0 0 0 4     Review of Systems  All other systems reviewed and are negative.    Allergies  Penicillins  Home Medications   Current Outpatient Rx  Name  Route  Sig  Dispense  Refill  . HYDROcodone-acetaminophen (NORCO/VICODIN) 5-325 MG per tablet   Oral   Take 1-2 tablets by mouth every 4 (four) hours as needed.   30 tablet   0   . Prenatal Vit-Fe Fumarate-FA (PRENATAL MULTIVITAMIN) TABS   Oral   Take 1 tablet by mouth daily at 12 noon.         . Rivaroxaban (XARELTO) 15 MG TABS tablet   Oral  Take 1 tablet (15 mg total) by mouth 2 (two) times daily with a meal.   42 tablet   0   . Simethicone (GAS-X PO)   Oral   Take 1 tablet by mouth once as needed (for gas and upset stomach).         . Rivaroxaban (XARELTO) 20 MG TABS tablet   Oral   Take 1 tablet (20 mg total) by mouth daily with breakfast.   30 tablet   5    BP 152/89  Pulse 50  Resp 19  SpO2 100%  LMP 11/29/2012 Physical Exam  Nursing note and vitals reviewed. Constitutional: She is oriented to person, place, and time. She appears well-developed and well-nourished.  HENT:  Head: Normocephalic and atraumatic.  Eyes: Conjunctivae and EOM are normal. Pupils are equal, round, and reactive to light.  Neck: Normal range of motion and phonation normal. Neck supple.  Cardiovascular: Normal rate, regular rhythm and intact distal pulses.   Pulmonary/Chest: Effort normal and breath sounds normal. She exhibits no tenderness.  Abdominal: Soft. She exhibits no distension. There is no tenderness. There is no guarding.  Musculoskeletal: Normal range of motion.  Neurological: She is alert and oriented to person, place, and time. She exhibits normal  muscle tone.  Skin: Skin is warm and dry.  Psychiatric: She has a normal mood and affect. Her behavior is normal. Judgment and thought content normal.    ED Course  Procedures (including critical care time)  Medications  acetaminophen (TYLENOL) tablet 650 mg (650 mg Oral Given 12/12/12 0054)   Patient Vitals for the past 24 hrs:  BP Pulse Resp SpO2  12/12/12 0130 152/89 mmHg 50 19 100 %  12/12/12 0100 163/85 mmHg 70 16 100 %  12/12/12 0000 149/90 mmHg 50 16 100 %  12/11/12 2330 144/94 mmHg 61 18 100 %     1:30 AM Reevaluation with update and discussion. After initial assessment and treatment, an updated evaluation reveals she is calm, comfortable, has no further complaint. [name] L      Labs Review Labs Reviewed  COMPREHENSIVE METABOLIC PANEL - Abnormal;  Notable for the following:    Total Bilirubin 0.2 (*)    All other components within normal limits  CBC  PROTIME-INR   Imaging Review Dg Chest 2 View  12/12/2012   CLINICAL DATA:  Chest pain, shortness of Breath.  EXAM: CHEST  2 VIEW  COMPARISON:  12/06/2012  FINDINGS: Minimal scarring in the left base, stable. Heart is borderline in size. No acute opacities or effusions. No acute bony abnormality.  IMPRESSION: No active cardiopulmonary disease.   Electronically Signed   By: Charlett Nose M.D.   On: 12/12/2012 00:25    MDM   1. Hematemesis      Minor bleeding, on xarelto, spontaneously, controlled. She is stable for discharge with observation and expectant management. She is counseled to avoid falling cutting or hurting herself.   Nursing Notes Reviewed/ Care Coordinated, and agree without changes. Applicable Imaging Reviewed.  Interpretation of Laboratory Data incorporated into ED treatment   Plan: Home Medications- usual; Home Treatments and Observation- rest; return here if the recommended treatment, does not improve the symptoms; Recommended follow up- PCP, when necessary      Flint Melter, MD 12/12/12 [phone]

## 2013-03-06 ENCOUNTER — Encounter (HOSPITAL_COMMUNITY): Payer: Self-pay | Admitting: Emergency Medicine

## 2013-03-06 ENCOUNTER — Emergency Department (HOSPITAL_COMMUNITY)
Admission: EM | Admit: 2013-03-06 | Discharge: 2013-03-07 | Disposition: A | Discharge status: Home / Self Care | Payer: Medicaid Other | Attending: Emergency Medicine | Admitting: Emergency Medicine

## 2013-03-06 DIAGNOSIS — Z3202 Encounter for pregnancy test, result negative: Secondary | ICD-10-CM | POA: Insufficient documentation

## 2013-03-06 DIAGNOSIS — Z7901 Long term (current) use of anticoagulants: Secondary | ICD-10-CM | POA: Insufficient documentation

## 2013-03-06 DIAGNOSIS — K5289 Other specified noninfective gastroenteritis and colitis: Secondary | ICD-10-CM | POA: Insufficient documentation

## 2013-03-06 DIAGNOSIS — Z862 Personal history of diseases of the blood and blood-forming organs and certain disorders involving the immune mechanism: Secondary | ICD-10-CM | POA: Insufficient documentation

## 2013-03-06 DIAGNOSIS — K529 Noninfective gastroenteritis and colitis, unspecified: Secondary | ICD-10-CM

## 2013-03-06 DIAGNOSIS — Z880 Allergy status to penicillin: Secondary | ICD-10-CM | POA: Insufficient documentation

## 2013-03-06 DIAGNOSIS — Z8659 Personal history of other mental and behavioral disorders: Secondary | ICD-10-CM | POA: Insufficient documentation

## 2013-03-06 DIAGNOSIS — Z86711 Personal history of pulmonary embolism: Secondary | ICD-10-CM | POA: Insufficient documentation

## 2013-03-06 DIAGNOSIS — F17200 Nicotine dependence, unspecified, uncomplicated: Secondary | ICD-10-CM | POA: Insufficient documentation

## 2013-03-06 LAB — POCT I-STAT, CHEM 8
Chloride: 104 mEq/L (ref 96–112)
Creatinine, Ser: 0.7 mg/dL (ref 0.50–1.10)
Glucose, Bld: 138 mg/dL — ABNORMAL HIGH (ref 70–99)
HCT: 41 % (ref 36.0–46.0)
Hemoglobin: 13.9 g/dL (ref 12.0–15.0)
Potassium: 3.9 mEq/L (ref 3.5–5.1)
TCO2: 22 mmol/L (ref 0–100)

## 2013-03-06 LAB — URINALYSIS W MICROSCOPIC + REFLEX CULTURE
Bilirubin Urine: NEGATIVE
Hgb urine dipstick: NEGATIVE
Nitrite: NEGATIVE
Specific Gravity, Urine: 1.029 (ref 1.005–1.030)
Urobilinogen, UA: 0.2 mg/dL (ref 0.0–1.0)
pH: 5.5 (ref 5.0–8.0)

## 2013-03-06 LAB — CBC WITH DIFFERENTIAL/PLATELET
Basophils Relative: 0 % (ref 0–1)
Eosinophils Absolute: 0.1 10*3/uL (ref 0.0–0.7)
HCT: 37.4 % (ref 36.0–46.0)
Hemoglobin: 12.4 g/dL (ref 12.0–15.0)
Lymphocytes Relative: 12 % (ref 12–46)
MCHC: 33.2 g/dL (ref 30.0–36.0)
MCV: 93.3 fL (ref 78.0–100.0)
Monocytes Absolute: 0.3 10*3/uL (ref 0.1–1.0)
Monocytes Relative: 5 % (ref 3–12)
Neutro Abs: 5.3 10*3/uL (ref 1.7–7.7)
Neutrophils Relative %: 82 % — ABNORMAL HIGH (ref 43–77)
RBC: 4.01 MIL/uL (ref 3.87–5.11)
WBC: 6.4 10*3/uL (ref 4.0–10.5)

## 2013-03-06 MED ORDER — DIPHENOXYLATE-ATROPINE 2.5-0.025 MG PO TABS: 1.0000 | ORAL_TABLET | Freq: Four times a day (QID) | ORAL | Status: DC | PRN

## 2013-03-06 MED ORDER — ONDANSETRON 8 MG PO TBDP
8.0000 mg | ORAL_TABLET | Freq: Once | ORAL | Status: AC
  Administered 2013-03-06: 8 mg via ORAL
  Filled 2013-03-06: qty 1

## 2013-03-06 MED ORDER — DIPHENOXYLATE-ATROPINE 2.5-0.025 MG PO TABS
1.0000 | ORAL_TABLET | Freq: Once | ORAL | Status: AC
  Administered 2013-03-06: 1 via ORAL
  Filled 2013-03-06: qty 1

## 2013-03-06 NOTE — ED Provider Notes (Signed)
CSN: [id number]     Arrival date & time 03/06/13  2043 History   First MD Initiated Contact with Patient 03/06/13 2138     Chief Complaint  Patient presents with  . Abdominal Pain  . Diarrhea   (Consider location/radiation/quality/duration/timing/severity/associated sxs/prior Treatment) HPI Nancy Francis is a 32 y.o. female who presents to ED with complaint of nausea and diarrhea. States her symptoms began this morning. States that she has had greater than 10 episodes of watery diarrhea. Denies any blood in her stool. States has diffuse abdominal cramping. Denies vomiting, however, states has had nausea, and took promethazine for it this afternoon. Pt denies any fever or chills. States that her 6yo daughter has had same symptoms few days ago. States her other daughter is here with similar complaints.   Past Medical History  Diagnosis Date  . Sickle cell trait   . Anemia   . Anxiety   . Depression   . Abnormal Pap smear 2010    repeat was negative  . PE (pulmonary thromboembolism)    Past Surgical History  Procedure Laterality Date  . Colposcopy    . Fracture surgery Right     3 surgeries   History reviewed. No pertinent family history. History  Substance Use Topics  . Smoking status: Current Some Day Smoker    Types: Cigarettes  . Smokeless tobacco: Not on file  . Alcohol Use: Yes     Comment: NONE WHILE PREG   OB History   Grav Para Term Preterm Abortions TAB SAB Ect Mult Living   4 4 4  0 0 0 0 0 0 4     Review of Systems  Constitutional: Negative for fever and chills.  Respiratory: Negative for cough, chest tightness and shortness of breath.   Cardiovascular: Negative for chest pain, palpitations and leg swelling.  Gastrointestinal: Positive for nausea, abdominal pain and diarrhea. Negative for vomiting.  Genitourinary: Negative for dysuria, flank pain, vaginal bleeding, vaginal discharge, vaginal pain and pelvic pain.  Musculoskeletal: Negative for arthralgias,  myalgias, neck pain and neck stiffness.  Skin: Negative for rash.  Neurological: Negative for dizziness, weakness and headaches.  All other systems reviewed and are negative.    Allergies  Penicillins  Home Medications   Current Outpatient Rx  Name  Route  Sig  Dispense  Refill  . Rivaroxaban (XARELTO) 20 MG TABS tablet   Oral   Take 1 tablet (20 mg total) by mouth daily with breakfast.   30 tablet   5   . EXPIRED: Rivaroxaban (XARELTO) 15 MG TABS tablet   Oral   Take 1 tablet (15 mg total) by mouth 2 (two) times daily with a meal.   42 tablet   0    BP 113/53  Pulse 112  Temp(Src) 99.2 F (37.3 C) (Oral)  Resp 20  Ht 5\' 4"  (1.626 m)  Wt 165 lb (74.844 kg)  BMI 28.31 kg/m2  SpO2 100%  LMP 02/11/2013  Breastfeeding? No Physical Exam  Nursing note and vitals reviewed. Constitutional: She appears well-developed and well-nourished. No distress.  HENT:  Head: Normocephalic.  Eyes: Conjunctivae are normal.  Neck: Neck supple.  Cardiovascular: Normal rate, regular rhythm and normal heart sounds.   Pulmonary/Chest: Effort normal and breath sounds normal. No respiratory distress. She has no wheezes. She has no rales.  Abdominal: Soft. Bowel sounds are normal. She exhibits no distension. There is no tenderness. There is no rebound and no guarding.  Musculoskeletal: She exhibits no edema.  Neurological:  She is alert.  Skin: Skin is warm and dry.    ED Course  Procedures (including critical care time) Labs Review Labs Reviewed  CBC WITH DIFFERENTIAL - Abnormal; Notable for the following:    Neutrophils Relative % 82 (*)    All other components within normal limits  URINALYSIS W MICROSCOPIC + REFLEX CULTURE - Abnormal; Notable for the following:    APPearance CLOUDY (*)    Squamous Epithelial / LPF FEW (*)    All other components within normal limits  POCT I-STAT, CHEM 8 - Abnormal; Notable for the following:    Glucose, Bld 138 (*)    All other components within  normal limits  POCT PREGNANCY, URINE   Imaging Review No results found.  EKG Interpretation   None       MDM   1. Gastroenteritis     Patient in emergency department with diarrhea and nausea. The symptoms just began this morning. Patient had a daughter with the same complaints a few days ago, states her symptoms lasted only a few hours and improved. Patient also has another daughter here with her in emergency department with same complaints as her. Patient is nontoxic appearing. She is tachycardic but otherwise normal vital signs. Her abdomen is soft and completely nontender my exam. Doubt surgical abdomen at this time. Labs and urine unremarkable. She has not had any vomiting and is able to tolerate PO fluids.  I suspect patient may have a viral gastroenteritis. I will treat her symptomatically with antiemetics and antidiarrhea medications. She will followup with her care doctor as needed  Filed Vitals:   03/06/13 2055  BP: 113/53  Pulse: 112  Temp: 99.2 F (37.3 C)  TempSrc: Oral  Resp: 20  Height: 5\' 4"  (1.626 m)  Weight: 165 lb (74.844 kg)  SpO2: 100%      Myriam Jacobson Nhi Butrum, PA-C 03/07/13 0139

## 2013-03-06 NOTE — ED Notes (Signed)
Pt able to keep fluids down . No emesis occurred.

## 2013-03-06 NOTE — ED Notes (Signed)
Ginger ale given

## 2013-03-06 NOTE — ED Notes (Signed)
Pt arrived to Ed with a complaint of abdominal pain with diarrhea.  Pt states she has not had emesis but feels nausea and abdominal pain.  Pt symptoms began earlier this am.

## 2013-03-07 NOTE — ED Provider Notes (Signed)
EKG Interpretation   None        Nancy Vallas T Meshia Rau, MD 03/07/13 2307 

## 2013-05-07 ENCOUNTER — Encounter (HOSPITAL_COMMUNITY): Payer: Self-pay | Admitting: Emergency Medicine

## 2013-05-07 ENCOUNTER — Emergency Department (HOSPITAL_COMMUNITY)
Admission: EM | Admit: 2013-05-07 | Discharge: 2013-05-07 | Disposition: A | Discharge status: Home / Self Care | Payer: Medicaid Other | Attending: Emergency Medicine | Admitting: Emergency Medicine

## 2013-05-07 DIAGNOSIS — F329 Major depressive disorder, single episode, unspecified: Secondary | ICD-10-CM | POA: Insufficient documentation

## 2013-05-07 DIAGNOSIS — F3289 Other specified depressive episodes: Secondary | ICD-10-CM | POA: Insufficient documentation

## 2013-05-07 DIAGNOSIS — Z862 Personal history of diseases of the blood and blood-forming organs and certain disorders involving the immune mechanism: Secondary | ICD-10-CM | POA: Insufficient documentation

## 2013-05-07 DIAGNOSIS — J029 Acute pharyngitis, unspecified: Secondary | ICD-10-CM | POA: Insufficient documentation

## 2013-05-07 DIAGNOSIS — Z7901 Long term (current) use of anticoagulants: Secondary | ICD-10-CM | POA: Insufficient documentation

## 2013-05-07 DIAGNOSIS — F411 Generalized anxiety disorder: Secondary | ICD-10-CM | POA: Insufficient documentation

## 2013-05-07 DIAGNOSIS — Z86711 Personal history of pulmonary embolism: Secondary | ICD-10-CM | POA: Insufficient documentation

## 2013-05-07 DIAGNOSIS — F17200 Nicotine dependence, unspecified, uncomplicated: Secondary | ICD-10-CM | POA: Insufficient documentation

## 2013-05-07 DIAGNOSIS — J0100 Acute maxillary sinusitis, unspecified: Secondary | ICD-10-CM | POA: Insufficient documentation

## 2013-05-07 DIAGNOSIS — Z880 Allergy status to penicillin: Secondary | ICD-10-CM | POA: Insufficient documentation

## 2013-05-07 MED ORDER — SALINE SPRAY 0.65 % NA SOLN: 1.0000 | NASAL | Status: DC | PRN

## 2013-05-07 MED ORDER — LEVOFLOXACIN 500 MG PO TABS
500.0000 mg | ORAL_TABLET | Freq: Every day | ORAL | Status: DC
Start: 2013-05-07 — End: 2013-06-02

## 2013-05-07 NOTE — ED Provider Notes (Signed)
CSN: [id number]     Arrival date & time 05/07/13  1719 History   First MD Initiated Contact with Patient 05/07/13 1742     Chief Complaint  Patient presents with  . Facial Pain     (Consider location/radiation/quality/duration/timing/severity/associated sxs/prior Treatment) HPI Pt is a 33yo female presenting with 2 week hx of gradually worsening nasal congestion associated with sinus pressure, headache, cough, and sore throat. Reports taking Dayquil, nyquil, and tylenol with minimal relief. Also using humidifier at night but states nose feels dry and has bleed some when blowing her nose. Denies fever, n/v/d. Does have a PCP but reports it is a walk-in clinic that does not accept appointments. Pt is allergic to PCN. No known sick contacts.   Past Medical History  Diagnosis Date  . Sickle cell trait   . Anemia   . Anxiety   . Depression   . Abnormal Pap smear 2010    repeat was negative  . PE (pulmonary thromboembolism)    Past Surgical History  Procedure Laterality Date  . Colposcopy    . Fracture surgery Right     3 surgeries   No family history on file. History  Substance Use Topics  . Smoking status: Current Some Day Smoker    Types: Cigarettes  . Smokeless tobacco: Not on file  . Alcohol Use: Yes     Comment: NONE WHILE PREG   OB History   Grav Para Term Preterm Abortions TAB SAB Ect Mult Living   4 4 4  0 0 0 0 0 0 4     Review of Systems  Constitutional: Negative for fever and chills.  HENT: Positive for congestion and sore throat. Negative for ear discharge, ear pain, trouble swallowing and voice change.   Respiratory: Negative for cough, shortness of breath, wheezing and stridor.   Cardiovascular: Negative for chest pain.  Gastrointestinal: Negative for nausea, vomiting, abdominal pain, diarrhea and constipation.  Musculoskeletal: Negative for back pain, neck pain and neck stiffness.  Neurological: Positive for headaches.  All other systems reviewed and are  negative.      Allergies  Penicillins  Home Medications   Current Outpatient Rx  Name  Route  Sig  Dispense  Refill  . Rivaroxaban (XARELTO) 20 MG TABS tablet   Oral   Take 1 tablet (20 mg total) by mouth daily with breakfast.   30 tablet   5   . sertraline (ZOLOFT) 50 MG tablet   Oral   Take 50 mg by mouth every morning.         Marland Kitchen. levofloxacin (LEVAQUIN) 500 MG tablet   Oral   Take 1 tablet (500 mg total) by mouth daily.   7 tablet   0   . sodium chloride (OCEAN) 0.65 % SOLN nasal spray   Each Nare   Place 1 spray into both nostrils as needed for congestion.   1 Bottle   0    BP 121/58  Pulse 96  Temp(Src) 99 F (37.2 C) (Oral)  Resp 16  Ht 5\' 4"  (1.626 m)  Wt 170 lb (77.111 kg)  BMI 29.17 kg/m2  SpO2 100%  LMP 04/18/2013 Physical Exam  Nursing note and vitals reviewed. Constitutional: She appears well-developed and well-nourished. No distress.  Pt lying comfortably in exam bed, NAD.   HENT:  Head: Normocephalic and atraumatic.  Right Ear: Hearing, tympanic membrane, external ear and ear canal normal.  Left Ear: Hearing, tympanic membrane, external ear and ear canal normal.  Nose:  Mucosal edema present. Right sinus exhibits no maxillary sinus tenderness and no frontal sinus tenderness. Left sinus exhibits maxillary sinus tenderness. Left sinus exhibits no frontal sinus tenderness.  Mouth/Throat: Uvula is midline, oropharynx is clear and moist and mucous membranes are normal.  Eyes: Conjunctivae are normal. No scleral icterus.  Neck: Normal range of motion. Neck supple.  No nuchal rigidity or meningeal signs.  Cardiovascular: Normal rate, regular rhythm and normal heart sounds.   Pulmonary/Chest: Effort normal and breath sounds normal. No respiratory distress. She has no wheezes. She has no rales. She exhibits no tenderness.  No respiratory distress, able to speak in full sentences w/o difficulty. Lungs: CTAB  Abdominal: Soft. Bowel sounds are normal.  She exhibits no distension and no mass. There is no tenderness. There is no rebound and no guarding.  Musculoskeletal: Normal range of motion.  Neurological: She is alert.  Skin: Skin is warm and dry. She is not diaphoretic.    ED Course  Procedures (including critical care time) Labs Review Labs Reviewed - No data to display Imaging Review No results found.  EKG Interpretation   None       MDM   Final diagnoses:  Maxillary sinusitis, acute    Will tx pt for sinusitis. Not concerned for meningitis as no nuchal rigidity, no pneumonia. Will tx with levofloxacin as pt is allergic to PCN. Advised to f/u with PCP in 1 week if symptoms not improving. Return precautions provided. Pt verbalized understanding and agreement with tx plan.    Junius Finner, PA-C 05/07/13 1926

## 2013-05-07 NOTE — ED Notes (Signed)
Pt st's she has had sinus pressure with cough x's 2 weeks, has used otc meds without relief.

## 2013-05-08 NOTE — ED Provider Notes (Signed)
Medical screening examination/treatment/procedure(s) were performed by non-physician practitioner and as supervising physician I was immediately available for consultation/collaboration.  EKG Interpretation   None         Courtney F Horton, MD 05/08/13 0044 

## 2013-06-02 ENCOUNTER — Emergency Department (HOSPITAL_COMMUNITY)
Admission: EM | Admit: 2013-06-02 | Discharge: 2013-06-03 | Disposition: A | Discharge status: Home / Self Care | Payer: Medicaid Other | Attending: Emergency Medicine | Admitting: Emergency Medicine

## 2013-06-02 ENCOUNTER — Encounter (HOSPITAL_COMMUNITY): Payer: Self-pay | Admitting: Emergency Medicine

## 2013-06-02 DIAGNOSIS — L259 Unspecified contact dermatitis, unspecified cause: Secondary | ICD-10-CM | POA: Insufficient documentation

## 2013-06-02 DIAGNOSIS — Z8709 Personal history of other diseases of the respiratory system: Secondary | ICD-10-CM | POA: Insufficient documentation

## 2013-06-02 DIAGNOSIS — F17200 Nicotine dependence, unspecified, uncomplicated: Secondary | ICD-10-CM | POA: Insufficient documentation

## 2013-06-02 DIAGNOSIS — L309 Dermatitis, unspecified: Secondary | ICD-10-CM

## 2013-06-02 DIAGNOSIS — Z880 Allergy status to penicillin: Secondary | ICD-10-CM | POA: Insufficient documentation

## 2013-06-02 DIAGNOSIS — Z862 Personal history of diseases of the blood and blood-forming organs and certain disorders involving the immune mechanism: Secondary | ICD-10-CM | POA: Insufficient documentation

## 2013-06-02 DIAGNOSIS — Z7901 Long term (current) use of anticoagulants: Secondary | ICD-10-CM | POA: Insufficient documentation

## 2013-06-02 DIAGNOSIS — F411 Generalized anxiety disorder: Secondary | ICD-10-CM | POA: Insufficient documentation

## 2013-06-02 DIAGNOSIS — Z86711 Personal history of pulmonary embolism: Secondary | ICD-10-CM | POA: Insufficient documentation

## 2013-06-02 NOTE — ED Notes (Signed)
Reports small lesions over multiple areas of her body, like small pimples, over the past two weeks with many on her buttocks.  Pt changed into gown.

## 2013-06-02 NOTE — ED Notes (Signed)
C/o small bumps all over and itching x 2 weeks.  States symptoms started after she babysat a child with bumps on his stomach.

## 2013-06-03 NOTE — ED Provider Notes (Signed)
CSN: [id number]     Arrival date & time 06/02/13  2329 History   First MD Initiated Contact with Patient 06/02/13 2339     Chief Complaint  Patient presents with  . Rash    HPI  Nancy Francis is a 33 y.o. female with a PMH of sickle cell trait, anemia, anxiety, depression, and pulmonary embolism who presents to the ED for evaluation of rash. History was provided by the patient. Patient has had an intermittent rash throughout for the past two weeks. Rash appears to be worse at night. Patient also complains of dry skin and pruritis and has been taking Benadryl for this. Patient states she was in contact with a small child who had a rash to the stomach which resolved. No other close contacts in the house with a similar rash. May have used a new soap or detergent. No similar rashes in the past. No hx of eczema or other dermatologic reactions. Patient denies any medication changes. Seen in the ED on 05/07/13 for sinusitis and treated with Levaquin. Denies any reactions while taking this medication and completed the antibiotic. Patient has applied over the counter hydrocortisone cream with some relief. No open wounds or drainage. Patient denies any fevers, chills, rhinorrhea, sore throat, wheezing, cough, abdominal pain, nausea, emesis, headaches, weakness, or fatigue. No other complaints.   Past Medical History  Diagnosis Date  . Sickle cell trait   . Anemia   . Anxiety   . Depression   . Abnormal Pap smear 2010    repeat was negative  . PE (pulmonary thromboembolism)    Past Surgical History  Procedure Laterality Date  . Colposcopy    . Fracture surgery Right     3 surgeries   No family history on file. History  Substance Use Topics  . Smoking status: Current Some Day Smoker    Types: Cigarettes  . Smokeless tobacco: Not on file  . Alcohol Use: Yes     Comment: NONE WHILE PREG   OB History   Grav Para Term Preterm Abortions TAB SAB Ect Mult Living   4 4 4  0 0 0 0 0 0 4      Review of Systems  Constitutional: Negative for fever, chills, diaphoresis, activity change, appetite change and fatigue.  HENT: Negative for congestion, rhinorrhea and sore throat.   Respiratory: Negative for cough.   Gastrointestinal: Negative for nausea, vomiting, abdominal pain and diarrhea.  Genitourinary: Negative for dysuria.  Musculoskeletal: Negative for myalgias.  Skin: Positive for rash. Negative for wound.  Neurological: Negative for dizziness, weakness, light-headedness and headaches.    Allergies  Penicillins  Home Medications   Current Outpatient Rx  Name  Route  Sig  Dispense  Refill  . diphenhydrAMINE (BENADRYL) 25 MG tablet   Oral   Take 50 mg by mouth every 6 (six) hours as needed for allergies.         . Rivaroxaban (XARELTO) 20 MG TABS tablet   Oral   Take 1 tablet (20 mg total) by mouth daily with breakfast.   30 tablet   5   . sertraline (ZOLOFT) 50 MG tablet   Oral   Take 50 mg by mouth every morning.          BP 119/59  Pulse 95  Temp(Src) 98.4 F (36.9 C) (Oral)  Resp 18  SpO2 100%  LMP 04/18/2013  Filed Vitals:   06/02/13 2334 06/03/13 0050  BP: 116/74 119/59  Pulse: 97 95  Temp:  98.3 F (36.8 C) 98.4 F (36.9 C)  TempSrc: Oral Oral  Resp: 18 18  SpO2: 98% 100%    Physical Exam  Nursing note and vitals reviewed. Constitutional: She is oriented to person, place, and time. She appears well-developed and well-nourished. No distress.  HENT:  Head: Normocephalic and atraumatic.  Right Ear: External ear normal.  Left Ear: External ear normal.  Nose: Nose normal.  Mouth/Throat: Oropharynx is clear and moist. No oropharyngeal exudate.  No oral lesions. No periorbital or perioral edema.   Eyes: Conjunctivae are normal. Pupils are equal, round, and reactive to light. Right eye exhibits no discharge. Left eye exhibits no discharge.  Neck: Normal range of motion. Neck supple.  Cardiovascular: Normal rate, regular rhythm and  normal heart sounds.  Exam reveals no gallop and no friction rub.   No murmur heard. Pulmonary/Chest: Effort normal and breath sounds normal. No respiratory distress. She has no wheezes. She has no rales. She exhibits no tenderness.  Abdominal: Soft. She exhibits no distension. There is no tenderness.  Musculoskeletal: Normal range of motion. She exhibits no edema and no tenderness.  Neurological: She is alert and oriented to person, place, and time.  Skin: Skin is warm and dry. Rash noted. She is not diaphoretic.  Diffuse scattered flesh colored papular pinpoint rash to the back, chest, abdomen, UE and LE throughout. No burrows. No rash to the palms or web spaces of the digits bilaterally. No open wounds or drainage. Scattered healing scabs throughout. No underlying erythema or masses.     ED Course  Procedures (including critical care time) Labs Review Labs Reviewed - No data to display Imaging Review No results found.   EKG Interpretation None      MDM   Wynnie Pacetti is a 33 y.o. female with a PMH of sickle cell trait, anemia, anxiety, depression, and pulmonary embolism who presents to the ED for evaluation of rash. Rash possibly due to a contact dermatitis. No concern for anaphylaxis or other concerning allergic reaction. Doubt scabies. No signs of symptoms of an infections process/cellulitis/abscess. Patient afebrile and non-toxic in appearance. Patient instructed to continue to apply OTC hydrocortisone cream and keep skin clean. Also may try emollients to keep skin moist. Return precautions, discharge instructions, and follow-up was discussed with the patient before discharge.     Discharge Medication List as of 06/03/2013  2:04 AM       Final impressions: 1. Dermatitis      Greer Ee Wilmont Olund PA-C           Jillyn Ledger, [place] 06/04/13 [phone]

## 2013-06-03 NOTE — Discharge Instructions (Signed)
Continue hydrocortisone cream and emollient (Aquaphor or petroleum jelly)   Keep skin clean and dry  Return to the emergency department if you develop any changing/worsening condition, fever, swelling, drainage, spreading redness, oral sores, or any other concerns (please read additional information regarding your condition below)      Rash A rash is a change in the color or texture of your skin. There are many different types of rashes. You may have other problems that accompany your rash. CAUSES   Infections.  Allergic reactions. This can include allergies to pets or foods.  Certain medicines.  Exposure to certain chemicals, soaps, or cosmetics.  Heat.  Exposure to poisonous plants.  Tumors, both cancerous and noncancerous. SYMPTOMS   Redness.  Scaly skin.  Itchy skin.  Dry or cracked skin.  Bumps.  Blisters.  Pain. DIAGNOSIS  Your caregiver may do a physical exam to determine what type of rash you have. A skin sample (biopsy) may be taken and examined under a microscope. TREATMENT  Treatment depends on the type of rash you have. Your caregiver may prescribe certain medicines. For serious conditions, you may need to see a skin doctor (dermatologist). HOME CARE INSTRUCTIONS   Avoid the substance that caused your rash.  Do not scratch your rash. This can cause infection.  You may take cool baths to help stop itching.  Only take over-the-counter or prescription medicines as directed by your caregiver.  Keep all follow-up appointments as directed by your caregiver. SEEK IMMEDIATE MEDICAL CARE IF:  You have increasing pain, swelling, or redness.  You have a fever.  You have new or severe symptoms.  You have body aches, diarrhea, or vomiting.  Your rash is not better after 3 days. MAKE SURE YOU:  Understand these instructions.  Will watch your condition.  Will get help right away if you are not doing well or get worse. Document Released: 02/18/2002  Document Revised: 05/23/2011 Document Reviewed: 12/13/2010 [facility] Patient Information 2014 [place], [place].   Emergency Department Resource Guide 1) Find a Doctor and Pay Out of Pocket Although you won't have to find out who is covered by your insurance plan, it is a good idea to ask around and get recommendations. You will then need to call the office and see if the doctor you have chosen will accept you as a new patient and what types of options they offer for patients who are self-pay. Some doctors offer discounts or will set up payment plans for their patients who do not have insurance, but you will need to ask so you aren't surprised when you get to your appointment.  2) Contact Your Local Health Department Not all health departments have doctors that can see patients for sick visits, but many do, so it is worth a call to see if yours does. If you don't know where your local health department is, you can check in your phone book. The CDC also has a tool to help you locate your state's health department, and many state websites also have listings of all of their local health departments.  3) Find a Walk-in Clinic If your illness is not likely to be very severe or complicated, you may want to try a walk in clinic. These are popping up all over the country in pharmacies, drugstores, and shopping centers. They're usually staffed by nurse practitioners or physician assistants that have been trained to treat common illnesses and complaints. They're usually fairly quick and inexpensive. However, if you have serious medical issues  or chronic medical problems, these are probably not your best option.  No Primary Care Doctor: - Call Health Connect at  [phone] - they can help you locate a primary care doctor that  accepts your insurance, provides certain services, etc. - Physician Referral Service- [phone]  Chronic Pain Problems: Organization         Address  Phone   Notes  [facility]  [phone] Patients need to be referred by their primary care doctor.   Medication Assistance: Organization         Address  Phone   Notes  [facility]ication [facility] [address] [place], [place] [address] [phone] --Must be a resident of [place] [facility] -- Must have NO insurance coverage whatsoever (no Medicaid/ Medicare, etc.) -- The pt. MUST have a primary care doctor that directs their care regularly and follows them in the community   MedAssist  [phone]   [employer]  [phone]) [phone]    Agencies that provide inexpensive medical care: Organization         Address  Phone   Notes  [facility]  [phone]   [facility]    [phone]) [phone]   [facility] [address] [place], [place] [address] [phone]   [facility] [address], [place] [phone]) [phone]   Planned Parenthood    [phone]   [facility]    [phone]   [facility]  [address], [place] Phone:  [phone], Fax:  [phone]) [phone] Hours of Operation:  9 am - 6 pm, M-F.  Also accepts Medicaid/Medicare and self-pay.  [facility]  [address], [place] Phone: [phone]) [phone], Fax: [phone](336) [phone]. Hours of Operation:  8:30 am - 5:30 pm, M-F.  Also accepts Medicaid and self-pay.  [facility] [place] [address], [place] Phone: [phone]) [phone]   [facility] [address], [place], [place] [phone], Ext. [phone] Mondays & Thursdays: 7-9 AM.  First 15 patients are seen on a first come, first serve basis.    Medicaid-accepting [facility] Providers:  Organization         Address  Phone   Notes  [facility] [address], [place] [phone] Also accepts self-pay patients.  [facility] [address], [place]  [phone]   [facility] [address], [address], [place] [phone]   [facility] [address] [place] [address], [place] [phone]) [phone]   Renaye RakersVeita Bland [address], [place]   [phone] Only accepts [place] Access [place] patients after they have their name applied to their card.   Self-Pay (no insurance) in [facility]:  Organization         Address  Phone   Notes  Sickle Cell Patients, [facility] [address], [place] [phone]) [phone]   [facility] [address], [place] [phone]   [facility] [place]  [address], [place] [phone]   [facility]/Dr. Osei-Bonsu  [address] [place] [address], [place] or [phone], [place] [phone] Phone number for both [place] and [place] locations is the same.  [facility] [address], [place] [phone]   [facility] [address] [place] or [address] [phone]   [facility] [address], [place] [phone], phone; [phone], fax Sees patients 1st and 3rd Saturday of every month.  Must not qualify for public or private insurance (i.e. Medicaid, Medicare, [place] Health Choice, Veterans' Benefits)  Household income should be no more than 200% of the poverty level The clinic cannot treat you if you are pregnant or think you are pregnant  Sexually transmitted diseases are not treated at the clinic.    Dental Care: Organization         Address  Phone  Notes  [facility] Department of [facility] [address] [place], [place] [phone] Accepts children up to age 69 who are enrolled in [place] or [place] Health Choice; pregnant women with a Medicaid card; and children who have applied for Medicaid  or [place] Health Choice, but were declined, whose parents can pay a reduced fee at time of service.  [facility]  [address], [place] [phone] Accepts children up to age 51 who are enrolled in [place] or [place] Health Choice; pregnant women with a Medicaid card; and children who have applied for Medicaid or [place] Health Choice, but were declined, whose parents can pay a reduced fee at time of service.  [facility]  [address] [place], [place] [phone] Patients are seen by appointment only. Walk-ins are not accepted. [facility] will see patients 60 years of age and older. Monday - Tuesday (8am-5pm) Most Wednesdays (8:30-5pm) $30 per visit, cash only  [facility] Access PRO[facility]  [address], [facility] [phone] Patients are seen by appointment only. Walk-ins are not accepted. [facility] will see patients 36 years of age and older. One Wednesday Evening (Monthly: Volunteer Based).  $30 per visit, cash only  [employer]  [phone] for adults; Children under age 9, call Graduate Pediatric Dentistry at [phone]. Children aged 19-14, please call [phone] to request a pediatric application.  Dental services are provided in all areas of dental care including fillings, crowns and bridges, complete and partial dentures, implants, gum treatment, root canals, and extractions. Preventive care is also provided. Treatment is provided to both adults and children. Patients are selected via a lottery and there is often a waiting list.   [facility] [address], [place]  [phone] [email or web address]   [facility] [address] [place], [place] [phone], Ext. [phone] Second and Fourth Thursday of each month, opens at 6:30 AM; Clinic ends at 9 AM.  Patients are seen on a first-come first-served basis, and a limited number are seen  during each clinic.   [facility]  [address] [place], [place] [phone]   Eligibility Requirements You must have lived in [place], [place], or [place] counties for at least the last three months.   You cannot be eligible for state or federal sponsored [place], including [employer], [place], or [employer].   You generally cannot be eligible for healthcare insurance through your employer.    How to apply: Eligibility screenings are held every Tuesday and Wednesday afternoon from 1:00 pm until 4:00 pm. You do not need an appointment for the interview!  [facility] [address], [place], [place] [phone]   [facility]  [phone]   [facility]  [phone]   [facility]  [phone]    Behavioral Health Resources in the Community: Intensive Outpatient Programs Organization         Address  Phone  Notes  [facility] [address], [place], [place] [phone]   [facility] [address], [place], [place] [phone]   [facility] [address], [place], [place]  [phone]   [facility] [address],  [place], [place] [phone] or [phone]   Substance Abuse Resources Organization         Address  Phone  Notes  [facility]  [phone]   [facility]  [phone]   [facility]  [phone]   [facility]  [phone]   Residential & Outpatient Substance Abuse Program  [phone]   Psychological Services Organization         Address  Phone  Notes  [facility]  [phone]- [phone]   [facility]  [phone]- [phone]   [facility] [address], [place] [phone] or [phone]    Mobile Crisis Teams Organization         Address  Phone  Notes  [facility],  Mobile Crisis Care Unit  [phone]   [facility]  [address] [place], [place] [phone]   Doristine LocksSharon DeEsch [address] [place] [phone]    Self-Help/Support Groups Organization         Address  Phone             Notes  [facility] [place] - variety of support groups  336- [phone] Call for more information  Narcotics Anonymous (NA), [facility] [address], [place]  2 meetings at this location   Statisticianesidential Treatment Programs Organization         Address  Phone  Notes  [facility] [address],    [place]  [phone]   [facility]  [address], [place] [phone], [place], [place] [phone]   [facility] [address], [place] [phone] Admissions: 8am-3pm M-F  [facility] [address]    [place], [place] [phone]   [facility] [address], [place], [place] [phone]   [facility] [address]  [place], [place] [phone]   [facility] - Intensive Outpatient [address], [place], [place] [phone]   [facility] ([facility] [address]  [place], [place] [phone] or [phone]   [facility] ([facility]) [address] [place], [place] [phone] Accepts Medicaid  [facility] [address]  [place] [phone] Substance Abuse/Addiction Treatment   [facility] Resources Organization         Address  Phone  Notes  [facility]  [phone]) [phone]   Angie FavaJulie Brannon, PhD [address], [address] [place], [place]   [phone] or [phone](336) [phone]   [facility]   [address] [place], [place] [phone](336) [phone]   [facility] [address], [place], [place] [phone] Insurance/Medicaid/sponsorship through [employer] [facility]  Faith and Families [address]  [place], [place] [phone] [place], [place] [phone]    Dr. Adele Schilder  [phone]   [facility] Dept. 1) [address] [place] 2) [place] 3)  [address], [place] [phone]  [phone]   [place] [phone] or [phone] (After Hours)

## 2013-06-05 NOTE — ED Provider Notes (Signed)
Medical screening examination/treatment/procedure(s) were performed by non-physician practitioner and as supervising physician I was immediately available for consultation/collaboration.     Sunnie NielsenBrian Zoelle Markus, MD 06/05/13 [phone]

## 2013-06-06 ENCOUNTER — Encounter (HOSPITAL_COMMUNITY): Payer: Self-pay | Admitting: Emergency Medicine

## 2013-06-06 ENCOUNTER — Emergency Department (HOSPITAL_COMMUNITY)
Admission: EM | Admit: 2013-06-06 | Discharge: 2013-06-06 | Disposition: A | Discharge status: Home / Self Care | Payer: Medicaid Other | Attending: Emergency Medicine | Admitting: Emergency Medicine

## 2013-06-06 DIAGNOSIS — Z862 Personal history of diseases of the blood and blood-forming organs and certain disorders involving the immune mechanism: Secondary | ICD-10-CM | POA: Insufficient documentation

## 2013-06-06 DIAGNOSIS — F17200 Nicotine dependence, unspecified, uncomplicated: Secondary | ICD-10-CM | POA: Insufficient documentation

## 2013-06-06 DIAGNOSIS — Z86711 Personal history of pulmonary embolism: Secondary | ICD-10-CM | POA: Insufficient documentation

## 2013-06-06 DIAGNOSIS — F329 Major depressive disorder, single episode, unspecified: Secondary | ICD-10-CM | POA: Insufficient documentation

## 2013-06-06 DIAGNOSIS — F411 Generalized anxiety disorder: Secondary | ICD-10-CM | POA: Insufficient documentation

## 2013-06-06 DIAGNOSIS — Z79899 Other long term (current) drug therapy: Secondary | ICD-10-CM | POA: Insufficient documentation

## 2013-06-06 DIAGNOSIS — L282 Other prurigo: Secondary | ICD-10-CM

## 2013-06-06 DIAGNOSIS — Z880 Allergy status to penicillin: Secondary | ICD-10-CM | POA: Insufficient documentation

## 2013-06-06 DIAGNOSIS — Z7901 Long term (current) use of anticoagulants: Secondary | ICD-10-CM | POA: Insufficient documentation

## 2013-06-06 DIAGNOSIS — L299 Pruritus, unspecified: Secondary | ICD-10-CM | POA: Insufficient documentation

## 2013-06-06 DIAGNOSIS — F3289 Other specified depressive episodes: Secondary | ICD-10-CM | POA: Insufficient documentation

## 2013-06-06 MED ORDER — PREDNISONE 20 MG PO TABS: 40.0000 mg | ORAL_TABLET | Freq: Every day | ORAL | Status: DC

## 2013-06-06 MED ORDER — TRIAMCINOLONE ACETONIDE 0.1 % EX CREA: 1.0000 "application " | TOPICAL_CREAM | Freq: Two times a day (BID) | CUTANEOUS | Status: DC

## 2013-06-06 NOTE — ED Notes (Signed)
MD Cook at bedside. 

## 2013-06-06 NOTE — ED Notes (Signed)
Pt presents today with c/o itchy rash over body. 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  The child's mother has refused to tell the pt was the child has or the name of the medication he has been prescribed for his rash.  Pt in NAD, A&O.

## 2013-06-06 NOTE — ED Provider Notes (Signed)
CSN: [id number]     Arrival date & time 06/06/13  0840 History   First MD Initiated Contact with Patient 06/06/13 [phone]     Chief Complaint  Patient presents with  . Rash     (Consider location/radiation/quality/duration/timing/severity/associated sxs/prior Treatment) HPI Pt is a 33yo female presenting to ED c/o difufse pruritic rash that started 3 weeks ago. Pt was seen on ED for same on 06/02/13, dx with dermatitis and advised to continue using hydrocortisone cream.  Pt states she has also been using benadryl with only minimal relief. 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 The child's mother will not tell the pt what kind of rash the child has or the medication. Pt states the child's rash started on the stomach but pt states she notes the child still has the rash.  Denies any new foods, detergents, soaps, or medications.  Denies known allergies.  Denies fever, n/v/d. Denies cough or congestion. Denies localized erythema, warmth or swelling. Denies difficulty breathing.    Past Medical History  Diagnosis Date  . Sickle cell trait   . Anemia   . Anxiety   . Depression   . Abnormal Pap smear 2010    repeat was negative  . PE (pulmonary thromboembolism)    Past Surgical History  Procedure Laterality Date  . Colposcopy    . Fracture surgery Right     3 surgeries   History reviewed. No pertinent family history. History  Substance Use Topics  . Smoking status: Current Some Day Smoker    Types: Cigarettes  . Smokeless tobacco: Not on file  . Alcohol Use: Yes     Comment: NONE WHILE PREG   OB History   Grav Para Term Preterm Abortions TAB SAB Ect Mult Living   4 4 4  0 0 0 0 0 0 4     Review of Systems  Constitutional: Negative for fever, chills and fatigue.  Respiratory: Negative for cough and shortness of breath.   Skin: Positive for rash ( diffuse, pruritic ).  Neurological: Negative for weakness and numbness.  All other systems reviewed and are  negative.      Allergies  Penicillins  Home Medications   Current Outpatient Rx  Name  Route  Sig  Dispense  Refill  . diphenhydrAMINE (BENADRYL) 25 MG tablet   Oral   Take 50 mg by mouth every 6 (six) hours as needed for allergies.         . hydrocortisone cream 1 %   Topical   Apply 1 application topically 2 (two) times daily.         . Rivaroxaban (XARELTO) 20 MG TABS tablet   Oral   Take 1 tablet (20 mg total) by mouth daily with breakfast.   30 tablet   5   . sertraline (ZOLOFT) 50 MG tablet   Oral   Take 50 mg by mouth every morning.         . predniSONE (DELTASONE) 20 MG tablet   Oral   Take 2 tablets (40 mg total) by mouth daily.   10 tablet   0   . triamcinolone cream (KENALOG) 0.1 %   Topical   Apply 1 application topically 2 (two) times daily. Apply thin layer twice daily to affected areas.   15 g   1    BP 115/67  Pulse 89  Temp(Src) 97.8 F (36.6 C) (Oral)  Resp 19  SpO2 97%  LMP 04/17/2013 Physical Exam  Nursing note and vitals reviewed. Constitutional: She appears well-developed and well-nourished. No distress.  HENT:  Head: Normocephalic and atraumatic.  No periorbital or perioral edema or erythema.   Eyes: Conjunctivae are normal. No scleral icterus.  Neck: Normal range of motion.  Cardiovascular: Normal rate, regular rhythm and normal heart sounds.   Pulmonary/Chest: Effort normal and breath sounds normal. No respiratory distress. She has no wheezes. She has no rales. She exhibits no tenderness.  No respiratory distress, able to speak in full sentences w/o difficulty. Lungs: CTAB.  Abdominal: Soft. Bowel sounds are normal. She exhibits no distension and no mass. There is no tenderness. There is no rebound and no guarding.  Musculoskeletal: Normal range of motion.  Neurological: She is alert.  Skin: Skin is warm and dry. Rash noted. She is not diaphoretic.  Diffuse skin colored papular rash on arms, torso, back, and legs. 3  singular lesions on left palm, left second toe and right middle toe.  Some areas of excoriation. No induration, edema, or warmth.  No evidence of underlying infection.     ED Course  Procedures (including critical care time) Labs Review Labs Reviewed - No data to display Imaging Review No results found.   EKG Interpretation None      MDM   Final diagnoses:  Pruritic rash    Pt presenting for 2nd time to ED for c/o generalized pruritic rash. Pt appears well, non-toxic, NAD. Has used hydrocortisone and benadryl w/o relief. Pt denies fever, n/v/d. No respiratory distress. No evidence of anaphylaxis or underlying infection.  Discussed pt with Dr. Adriana Simasook who also examined pt.  Although 3 singular lesions of palm and both toes, no tract marks or additional characteristics of scabies. Will continue to treat as dermatitis. Will add triamcinolone cream and oral prednisone. Advised to discontinue hydrocortisone cream. Advised to f/u with PCP for recheck of symptoms. Pt may need referral to dermatology if symptoms do not improve. Return precautions provided. Pt verbalized understanding and agreement with tx plan.       Junius FinnerErin O'Malley, PA-C 06/06/13 [phone]

## 2013-06-06 NOTE — ED Notes (Signed)
Pt presents with generalized rash x 3 weeks.  Pt taking PO Benadryl and topical hydrocortisone with no relief.

## 2013-06-06 NOTE — Discharge Instructions (Signed)
Use the triamcinolone cream twice daily for itch.  Do NOT continue to use hydrocortisone cream if using triamcinolone.  You should also be sure to take all of the oral prednisone. This should help with itching.  Follow up with your primary care provider for recheck of symptoms. If not improving, you may need referral to dermatologist.  Return to ER if you experience NEW or worsening symptoms including: fever, pain, increased redness or warmth, or drainage from rash.  See further instructions below.

## 2013-06-09 NOTE — ED Provider Notes (Signed)
Medical screening examination/treatment/procedure(s) were conducted as a shared visit with non-physician practitioner(s) and myself.  I personally evaluated the patient during the encounter.   EKG Interpretation None     Rash appears benign. No evidence of scabies. Will Rx triamcinolone and prednisone  Donnetta HutchingBrian Amaani Guilbault, MD 06/09/13 1524

## 2013-07-01 ENCOUNTER — Emergency Department (HOSPITAL_COMMUNITY)
Admission: EM | Admit: 2013-07-01 | Discharge: 2013-07-01 | Disposition: A | Discharge status: Home / Self Care | Payer: Medicaid Other | Attending: Emergency Medicine | Admitting: Emergency Medicine

## 2013-07-01 ENCOUNTER — Encounter (HOSPITAL_COMMUNITY): Payer: Self-pay | Admitting: Emergency Medicine

## 2013-07-01 DIAGNOSIS — N760 Acute vaginitis: Secondary | ICD-10-CM | POA: Insufficient documentation

## 2013-07-01 DIAGNOSIS — D573 Sickle-cell trait: Secondary | ICD-10-CM | POA: Insufficient documentation

## 2013-07-01 DIAGNOSIS — F17200 Nicotine dependence, unspecified, uncomplicated: Secondary | ICD-10-CM | POA: Insufficient documentation

## 2013-07-01 DIAGNOSIS — Z79899 Other long term (current) drug therapy: Secondary | ICD-10-CM | POA: Insufficient documentation

## 2013-07-01 DIAGNOSIS — F3289 Other specified depressive episodes: Secondary | ICD-10-CM | POA: Insufficient documentation

## 2013-07-01 DIAGNOSIS — A7489 Other chlamydial diseases: Secondary | ICD-10-CM | POA: Insufficient documentation

## 2013-07-01 DIAGNOSIS — Z86711 Personal history of pulmonary embolism: Secondary | ICD-10-CM | POA: Insufficient documentation

## 2013-07-01 DIAGNOSIS — Z880 Allergy status to penicillin: Secondary | ICD-10-CM | POA: Insufficient documentation

## 2013-07-01 DIAGNOSIS — B9689 Other specified bacterial agents as the cause of diseases classified elsewhere: Secondary | ICD-10-CM

## 2013-07-01 DIAGNOSIS — D649 Anemia, unspecified: Secondary | ICD-10-CM | POA: Insufficient documentation

## 2013-07-01 DIAGNOSIS — F411 Generalized anxiety disorder: Secondary | ICD-10-CM | POA: Insufficient documentation

## 2013-07-01 DIAGNOSIS — F329 Major depressive disorder, single episode, unspecified: Secondary | ICD-10-CM | POA: Insufficient documentation

## 2013-07-01 DIAGNOSIS — A749 Chlamydial infection, unspecified: Secondary | ICD-10-CM

## 2013-07-01 LAB — WET PREP, GENITAL
TRICH WET PREP: NONE SEEN
Yeast Wet Prep HPF POC: NONE SEEN

## 2013-07-01 MED ORDER — DOXYCYCLINE HYCLATE 100 MG PO CAPS: 100.0000 mg | ORAL_CAPSULE | Freq: Two times a day (BID) | ORAL | Status: DC

## 2013-07-01 MED ORDER — METRONIDAZOLE 500 MG PO TABS: 500.0000 mg | ORAL_TABLET | Freq: Two times a day (BID) | ORAL | Status: DC

## 2013-07-01 MED ORDER — DOXYCYCLINE HYCLATE 100 MG PO TABS
100.0000 mg | ORAL_TABLET | Freq: Once | ORAL | Status: AC
  Administered 2013-07-01: 100 mg via ORAL
  Filled 2013-07-01: qty 1

## 2013-07-01 MED ORDER — METRONIDAZOLE 500 MG PO TABS
500.0000 mg | ORAL_TABLET | Freq: Once | ORAL | Status: AC
  Administered 2013-07-01: 500 mg via ORAL
  Filled 2013-07-01: qty 1

## 2013-07-01 NOTE — ED Notes (Signed)
Pt sts having abdominal pain for 2 weeks. sts was seen at the health department and treated for BV. sts was told she also might have chlamydia. sts just finished medication and still having symptoms.

## 2013-07-01 NOTE — ED Provider Notes (Signed)
CSN: [id number]     Arrival date & time 07/01/13  1807 History   First MD Initiated Contact with Patient 07/01/13 2138     Chief Complaint  Patient presents with  . Abdominal Pain     (Consider location/radiation/quality/duration/timing/severity/associated sxs/prior Treatment) The history is provided by the patient.  Nancy Francis is a 33 y.o. female hx of sickle cell trait, depression, BV here with vaginal discharge. Patient had vaginal discharge for 2 weeks, foul smelling, whitish discharge. Went to health department several days ago, was diagnosed with BV and finished 5 days of abx. Also was called today that she has chlamydia but hasn't been treated yet. She has lower abdominal pain that is intermittent. No urinary symptoms. No vomiting or diarrhea. She has one female partner, doesn't use protection. Boyfriend was treated today. She has an IUD in since last year. Denies being pregnant.    Past Medical History  Diagnosis Date  . Sickle cell trait   . Anemia   . Anxiety   . Depression   . Abnormal Pap smear 2010    repeat was negative  . PE (pulmonary thromboembolism)    Past Surgical History  Procedure Laterality Date  . Colposcopy    . Fracture surgery Right     3 surgeries   History reviewed. No pertinent family history. History  Substance Use Topics  . Smoking status: Current Some Day Smoker    Types: Cigarettes  . Smokeless tobacco: Not on file  . Alcohol Use: Yes     Comment: NONE WHILE PREG   OB History   Grav Para Term Preterm Abortions TAB SAB Ect Mult Living   4 4 4  0 0 0 0 0 0 4     Review of Systems  Gastrointestinal: Positive for abdominal pain.  All other systems reviewed and are negative.     Allergies  Penicillins  Home Medications   Prior to Admission medications   Medication Sig Start Date End Date Taking? Authorizing Provider  PRESCRIPTION MEDICATION Take 1 tablet by mouth daily. An antibiotic   Yes Historical Provider, MD  Rivaroxaban  (XARELTO) 20 MG TABS tablet Take 1 tablet (20 mg total) by mouth daily with breakfast. 12/29/12  Yes Russella DarAllison L Ellis, NP  sertraline (ZOLOFT) 100 MG tablet Take 100 mg by mouth daily.   Yes Historical Provider, MD   BP 133/75  Pulse 78  Temp(Src) 99 F (37.2 C) (Oral)  Resp 14  Ht 5\' 4"  (1.626 m)  Wt 160 lb (72.576 kg)  BMI 27.45 kg/m2  SpO2 100%  LMP 05/29/2013 Physical Exam  Nursing note and vitals reviewed. Constitutional: She is oriented to person, place, and time. She appears well-developed and well-nourished.  Slightly anxious   HENT:  Head: Normocephalic.  Mouth/Throat: Oropharynx is clear and moist.  Eyes: Conjunctivae and EOM are normal. Pupils are equal, round, and reactive to light.  Neck: Normal range of motion. Neck supple.  Cardiovascular: Normal rate, regular rhythm and normal heart sounds.   Pulmonary/Chest: Effort normal and breath sounds normal. No respiratory distress. She has no wheezes. She has no rales.  Abdominal: Soft. Bowel sounds are normal. She exhibits no distension. There is no tenderness. There is no rebound.  Genitourinary:  Copious whitish discharge. No CMT or adnexal tenderness   Musculoskeletal: Normal range of motion. She exhibits no edema and no tenderness.  Neurological: She is alert and oriented to person, place, and time. No cranial nerve deficit. Coordination normal.  Skin: Skin is  warm and dry.  Psychiatric: She has a normal mood and affect. Her behavior is normal. Judgment and thought content normal.    ED Course  Procedures (including critical care time) Labs Review Labs Reviewed  WET PREP, GENITAL - Abnormal; Notable for the following:    Clue Cells Wet Prep HPF POC FEW (*)    WBC, Wet Prep HPF POC FEW (*)    All other components within normal limits  GC/CHLAMYDIA PROBE AMP  URINALYSIS, ROUTINE W REFLEX MICROSCOPIC  PREGNANCY, URINE    Imaging Review No results found.   EKG Interpretation None      MDM   Final  diagnoses:  None   Nancy StanfordDominique Walstad is a 33 y.o. female here with lower abdominal pain and vaginal discharge. Pain likely from BV and chlamydia. Will check wet prep, GC/chlamydia again. Will treat empirically.   10:43 PM Wet prep + clue cells. Given flagyl. Given doxy for chlamydia. Will d/c home.    Richardean Canalavid H Anairis Knick, MD 07/01/13 [id number]

## 2013-07-01 NOTE — Discharge Instructions (Signed)
Take doxycycline twice a day for a week.   Take flagyl twice a day for 10 days.   Follow up with your doctor.   Return to ER if you have worse discharge, severe pain.

## 2013-07-02 LAB — GC/CHLAMYDIA PROBE AMP
CT Probe RNA: POSITIVE — AB
GC Probe RNA: NEGATIVE

## 2013-07-03 ENCOUNTER — Telehealth (HOSPITAL_BASED_OUTPATIENT_CLINIC_OR_DEPARTMENT_OTHER): Payer: Self-pay | Admitting: Emergency Medicine

## 2013-07-03 NOTE — Telephone Encounter (Signed)
+  Chlamydia. Patient treated with Doxycycline. [facility] faxed. 

## 2013-09-22 ENCOUNTER — Emergency Department (HOSPITAL_COMMUNITY)
Admission: EM | Admit: 2013-09-22 | Discharge: 2013-09-23 | Disposition: A | Discharge status: Home / Self Care | Payer: Medicaid Other | Attending: Emergency Medicine | Admitting: Emergency Medicine

## 2013-09-22 ENCOUNTER — Encounter (HOSPITAL_COMMUNITY): Payer: Self-pay | Admitting: Emergency Medicine

## 2013-09-22 DIAGNOSIS — F3289 Other specified depressive episodes: Secondary | ICD-10-CM | POA: Insufficient documentation

## 2013-09-22 DIAGNOSIS — Z202 Contact with and (suspected) exposure to infections with a predominantly sexual mode of transmission: Secondary | ICD-10-CM | POA: Insufficient documentation

## 2013-09-22 DIAGNOSIS — Z87891 Personal history of nicotine dependence: Secondary | ICD-10-CM | POA: Diagnosis not present

## 2013-09-22 DIAGNOSIS — B86 Scabies: Secondary | ICD-10-CM | POA: Diagnosis not present

## 2013-09-22 DIAGNOSIS — Z792 Long term (current) use of antibiotics: Secondary | ICD-10-CM | POA: Diagnosis not present

## 2013-09-22 DIAGNOSIS — F411 Generalized anxiety disorder: Secondary | ICD-10-CM | POA: Insufficient documentation

## 2013-09-22 DIAGNOSIS — R109 Unspecified abdominal pain: Secondary | ICD-10-CM | POA: Diagnosis not present

## 2013-09-22 DIAGNOSIS — F329 Major depressive disorder, single episode, unspecified: Secondary | ICD-10-CM | POA: Diagnosis not present

## 2013-09-22 DIAGNOSIS — Z862 Personal history of diseases of the blood and blood-forming organs and certain disorders involving the immune mechanism: Secondary | ICD-10-CM | POA: Diagnosis not present

## 2013-09-22 DIAGNOSIS — I2699 Other pulmonary embolism without acute cor pulmonale: Secondary | ICD-10-CM | POA: Insufficient documentation

## 2013-09-22 DIAGNOSIS — Z3202 Encounter for pregnancy test, result negative: Secondary | ICD-10-CM | POA: Insufficient documentation

## 2013-09-22 DIAGNOSIS — Z880 Allergy status to penicillin: Secondary | ICD-10-CM | POA: Diagnosis not present

## 2013-09-22 DIAGNOSIS — Z79899 Other long term (current) drug therapy: Secondary | ICD-10-CM | POA: Insufficient documentation

## 2013-09-22 DIAGNOSIS — N9489 Other specified conditions associated with female genital organs and menstrual cycle: Secondary | ICD-10-CM | POA: Diagnosis present

## 2013-09-22 LAB — CBC WITH DIFFERENTIAL/PLATELET
BASOS PCT: 1 % (ref 0–1)
Basophils Absolute: 0 10*3/uL (ref 0.0–0.1)
EOS ABS: 0.2 10*3/uL (ref 0.0–0.7)
EOS PCT: 3 % (ref 0–5)
HCT: 33.3 % — ABNORMAL LOW (ref 36.0–46.0)
HEMOGLOBIN: 10.8 g/dL — AB (ref 12.0–15.0)
Lymphocytes Relative: 45 % (ref 12–46)
Lymphs Abs: 2.6 10*3/uL (ref 0.7–4.0)
MCH: 28.8 pg (ref 26.0–34.0)
MCHC: 32.4 g/dL (ref 30.0–36.0)
MCV: 88.8 fL (ref 78.0–100.0)
MONOS PCT: 8 % (ref 3–12)
Monocytes Absolute: 0.4 10*3/uL (ref 0.1–1.0)
Neutro Abs: 2.4 10*3/uL (ref 1.7–7.7)
Neutrophils Relative %: 43 % (ref 43–77)
PLATELETS: 302 10*3/uL (ref 150–400)
RBC: 3.75 MIL/uL — AB (ref 3.87–5.11)
RDW: 17.9 % — ABNORMAL HIGH (ref 11.5–15.5)
WBC: 5.6 10*3/uL (ref 4.0–10.5)

## 2013-09-22 LAB — COMPREHENSIVE METABOLIC PANEL
ALT: 10 U/L (ref 0–35)
ANION GAP: 15 (ref 5–15)
AST: 13 U/L (ref 0–37)
Albumin: 3.8 g/dL (ref 3.5–5.2)
Alkaline Phosphatase: 94 U/L (ref 39–117)
BUN: 12 mg/dL (ref 6–23)
CALCIUM: 9.3 mg/dL (ref 8.4–10.5)
CO2: 24 mEq/L (ref 19–32)
Chloride: 104 mEq/L (ref 96–112)
Creatinine, Ser: 0.61 mg/dL (ref 0.50–1.10)
GFR calc non Af Amer: >90 mL/min (ref >90)
Glucose, Bld: 98 mg/dL (ref 70–99)
Potassium: 4.1 mEq/L (ref 3.7–5.3)
Sodium: 143 mEq/L (ref 137–147)
TOTAL PROTEIN: 7.7 g/dL (ref 6.0–8.3)
Total Bilirubin: 0.2 mg/dL — ABNORMAL LOW (ref 0.3–1.2)

## 2013-09-22 LAB — POC URINE PREG, ED: PREG TEST UR: NEGATIVE

## 2013-09-22 NOTE — ED Notes (Signed)
Patient here with complaint of pelvic pain starting about 4 days ago. Endorses urinary frequency increase, foul odor, discharge, and painful intercourse. States dysuria last night after intercourse.

## 2013-09-23 LAB — URINALYSIS, ROUTINE W REFLEX MICROSCOPIC
Bilirubin Urine: NEGATIVE
Glucose, UA: NEGATIVE mg/dL
HGB URINE DIPSTICK: NEGATIVE
Ketones, ur: NEGATIVE mg/dL
LEUKOCYTES UA: NEGATIVE
Nitrite: NEGATIVE
PROTEIN: NEGATIVE mg/dL
Specific Gravity, Urine: 1.03 (ref 1.005–1.030)
Urobilinogen, UA: 0.2 mg/dL (ref 0.0–1.0)
pH: 5.5 (ref 5.0–8.0)

## 2013-09-23 LAB — WET PREP, GENITAL
Trich, Wet Prep: NONE SEEN
Yeast Wet Prep HPF POC: NONE SEEN

## 2013-09-23 LAB — RPR

## 2013-09-23 LAB — HIV ANTIBODY (ROUTINE TESTING W REFLEX): HIV 1&2 Ab, 4th Generation: NONREACTIVE

## 2013-09-23 MED ORDER — AZITHROMYCIN 250 MG PO TABS
1000.0000 mg | ORAL_TABLET | Freq: Every day | ORAL | Status: DC
  Administered 2013-09-23: 1000 mg via ORAL
  Filled 2013-09-23: qty 4

## 2013-09-23 MED ORDER — CEFTRIAXONE SODIUM 250 MG IJ SOLR
250.0000 mg | Freq: Once | INTRAMUSCULAR | Status: AC
  Administered 2013-09-23: 250 mg via INTRAMUSCULAR
  Filled 2013-09-23: qty 250

## 2013-09-23 MED ORDER — METRONIDAZOLE 500 MG PO TABS
2000.0000 mg | ORAL_TABLET | Freq: Once | ORAL | Status: AC
  Administered 2013-09-23: 2000 mg via ORAL
  Filled 2013-09-23: qty 4

## 2013-09-23 MED ORDER — PERMETHRIN 5 % EX CREA
TOPICAL_CREAM | CUTANEOUS | Status: DC
Start: 2013-09-23 — End: 2015-10-18

## 2013-09-23 MED ORDER — LIDOCAINE HCL (PF) 1 % IJ SOLN
INTRAMUSCULAR | Status: AC
  Administered 2013-09-23: 1.6 mL
  Filled 2013-09-23: qty 5

## 2013-09-23 NOTE — Discharge Instructions (Signed)
You were seen and evaluated for your vaginal discharge and irritation. Your providers are concerned for possible sexual transmitted disease and you were given treatments. Please followup with the [facility] in your OB/GYN for continued evaluation and for possible removal and replacement of your IUD.   Sexually Transmitted Disease A sexually transmitted disease (STD) is a disease or infection that may be passed (transmitted) from person to person, usually during sexual activity. This may happen by way of saliva, semen, blood, vaginal mucus, or urine. Common STDs include:   Gonorrhea.   Chlamydia.   Syphilis.   HIV and AIDS.   Genital herpes.   Hepatitis B and C.   Trichomonas.   Human papillomavirus (HPV).   Pubic lice.   Scabies.  Mites.  Bacterial vaginosis. WHAT ARE CAUSES OF STDs? An STD may be caused by bacteria, a virus, or parasites. STDs are often transmitted during sexual activity if one person is infected. However, they may also be transmitted through nonsexual means. STDs may be transmitted after:   Sexual intercourse with an infected person.   Sharing sex toys with an infected person.   Sharing needles with an infected person or using unclean piercing or tattoo needles.  Having intimate contact with the genitals, mouth, or rectal areas of an infected person.   Exposure to infected fluids during birth. WHAT ARE THE SIGNS AND SYMPTOMS OF STDs? Different STDs have different symptoms. Some people may not have any symptoms. If symptoms are present, they may include:   Painful or bloody urination.   Pain in the pelvis, abdomen, vagina, anus, throat, or eyes.   A skin rash, itching, or irritation.  Growths, ulcerations, blisters, or sores in the genital and anal areas.  Abnormal vaginal discharge with or without bad odor.   Penile discharge in men.   Fever.   Pain or bleeding during sexual intercourse.   Swollen  glands in the groin area.   Yellow skin and eyes (jaundice). This is seen with hepatitis.   Swollen testicles.  Infertility.  Sores and blisters in the mouth. HOW ARE STDs DIAGNOSED? To make a diagnosis, your health care provider may:   Take a medical history.   Perform a physical exam.   Take a sample of any discharge to examine.  Swab the throat, cervix, opening to the penis, rectum, or vagina for testing.  Test a sample of your first morning urine.   Perform blood tests.   Perform a Pap test, if this applies.   Perform a colposcopy.   Perform a laparoscopy.  HOW ARE STDs TREATED? Treatment depends on the STD. Some STDs may be treated but not cured.   Chlamydia, gonorrhea, trichomonas, and syphilis can be cured with antibiotic medicine.   Genital herpes, hepatitis, and HIV can be treated, but not cured, with prescribed medicines. The medicines lessen symptoms.   Genital warts from HPV can be treated with medicine or by freezing, burning (electrocautery), or surgery. Warts may come back.   HPV cannot be cured with medicine or surgery. However, abnormal areas may be removed from the cervix, vagina, or vulva.   If your diagnosis is confirmed, your recent sexual partners need treatment. This is true even if they are symptom-free or have a negative culture or evaluation. They should not have sex until their health care providers say it is okay. HOW CAN I REDUCE MY RISK OF GETTING AN STD? Take these steps to reduce your risk of getting an STD:  Use  latex condoms, dental dams, and water-soluble lubricants during sexual activity. Do not use petroleum jelly or oils.  Avoid having multiple sex partners.  Do not have sex with someone who has other sex partners.  Do not have sex with anyone you do not know or who is at high risk for an STD.  Avoid risky sex practices that can break your skin.  Do not have sex if you have open sores on your mouth or  skin.  Avoid drinking too much alcohol or taking illegal drugs. Alcohol and drugs can affect your judgment and put you in a vulnerable position.  Avoid engaging in oral and anal sex acts.  Get vaccinated for HPV and hepatitis. If you have not received these vaccines in the past, talk to your health care provider about whether one or both might be right for you.   If you are at risk of being infected with HIV, it is recommended that you take a prescription medicine daily to prevent HIV infection. This is called pre-exposure prophylaxis (PrEP). You are considered at risk if:  You are a man who has sex with other men (MSM).  You are a heterosexual man or woman and are sexually active with more than one partner.  You take drugs by injection.  You are sexually active with a partner who has HIV.  Talk with your health care provider about whether you are at high risk of being infected with HIV. If you choose to begin PrEP, you should first be tested for HIV. You should then be tested every 3 months for as long as you are taking PrEP.  WHAT SHOULD I DO IF I THINK I HAVE AN STD?  See your health care provider.   Tell your sexual partner(s). They should be tested and treated for any STDs.  Do not have sex until your health care provider says it is okay. WHEN SHOULD I GET IMMEDIATE MEDICAL CARE? Contact your health care provider right away if:   You have severe abdominal pain.  You are a man and notice swelling or pain in your testicles.  You are a woman and notice swelling or pain in your vagina. Document Released: 05/21/2002 Document Revised: 03/05/2013 Document Reviewed: 09/18/2012 [facility] Patient Information 2015 [place], [place]. This information is not intended to replace advice given to you by your health care provider. Make sure you discuss any questions you have with your health care provider.   Scabies Scabies are small bugs (mites) that burrow under the skin and cause red  bumps and severe itching. These bugs can only be seen with a microscope. Scabies are highly contagious. They can spread easily from person to person by direct contact. They are also spread through sharing clothing or linens that have the scabies mites living in them. It is not unusual for an entire family to become infected through shared towels, clothing, or bedding.  HOME CARE INSTRUCTIONS   Your caregiver may prescribe a cream or lotion to kill the mites. If cream is prescribed, massage the cream into the entire body from the neck to the bottom of both feet. Also massage the cream into the scalp and face if your child is less than 46 year old. Avoid the eyes and mouth. Do not wash your hands after application.  Leave the cream on for 8 to 12 hours. Your child should bathe or shower after the 8 to 12 hour application period. Sometimes it is helpful to apply the cream to your child  right before bedtime.  One treatment is usually effective and will eliminate approximately 95% of infestations. For severe cases, your caregiver may decide to repeat the treatment in 1 week. Everyone in your household should be treated with one application of the cream.  New rashes or burrows should not appear within 24 to 48 hours after successful treatment. However, the itching and rash may last for 2 to 4 weeks after successful treatment. Your caregiver may prescribe a medicine to help with the itching or to help the rash go away more quickly.  Scabies can live on clothing or linens for up to 3 days. All of your child's recently used clothing, towels, stuffed toys, and bed linens should be washed in hot water and then dried in a dryer for at least 20 minutes on high heat. Items that cannot be washed should be enclosed in a plastic bag for at least 3 days.  To help relieve itching, bathe your child in a cool bath or apply cool washcloths to the affected areas.  Your child may return to school after treatment with the  prescribed cream. SEEK MEDICAL CARE IF:   The itching persists longer than 4 weeks after treatment.  The rash spreads or becomes infected. Signs of infection include red blisters or yellow-tan crust. Document Released: 02/28/2005 Document Revised: 05/23/2011 Document Reviewed: 07/09/2008 [facility] Patient Information 2015 [employer] [place]. This information is not intended to replace advice given to you by your health care provider. Make sure you discuss any questions you have with your health care provider.

## 2013-09-23 NOTE — ED Provider Notes (Signed)
CSN: [id number]     Arrival date & time 09/22/13  2143 History   First MD Initiated Contact with Patient 09/22/13 2346     Chief Complaint  Patient presents with  . Pelvic Pain   HPI  History provided by the patient. Patient is a 33 year old female with previous history of anxiety, depression, PE one year ago during pregnancy no longer on anticoagulation who presents with complaints of lower abdomen and pelvic pain, dysuria, vaginal discharge and foul odor. Symptoms first began 4 days ago with slight dysuria and vaginal discharge. This has worsened with some vaginal irritation and itching. Yesterday evening she also reports having pain with intercourse. Denies any associated flank or back pain. No nausea or vomiting. No fever, chills or sweats. Does have a prior history of Chlamydia. No other aggravating or alleviating factors. No other associated symptoms.   Past Medical History  Diagnosis Date  . Sickle cell trait   . Anemia   . Anxiety   . Depression   . Abnormal Pap smear 2010    repeat was negative  . PE (pulmonary thromboembolism)    Past Surgical History  Procedure Laterality Date  . Colposcopy    . Fracture surgery Right     3 surgeries   No family history on file. History  Substance Use Topics  . Smoking status: Former Smoker    Types: Cigarettes    Quit date: 10/12/2012  . Smokeless tobacco: Not on file  . Alcohol Use: Yes     Comment: NONE WHILE PREG   OB History   Grav Para Term Preterm Abortions TAB SAB Ect Mult Living   4 4 4  0 0 0 0 0 0 4     Review of Systems  Constitutional: Negative for fever, chills and diaphoresis.  Gastrointestinal: Positive for abdominal pain. Negative for nausea, vomiting, diarrhea and constipation.  Genitourinary: Positive for dysuria, frequency, vaginal bleeding, vaginal discharge, vaginal pain and pelvic pain. Negative for hematuria, flank pain and menstrual problem.  All other systems reviewed and are  negative.     Allergies  Penicillins  Home Medications   Prior to Admission medications   Medication Sig Start Date End Date Taking? Authorizing Provider  doxycycline (VIBRAMYCIN) 100 MG capsule Take 1 capsule (100 mg total) by mouth 2 (two) times daily. One po bid x 7 days 07/01/13   Richardean Canalavid H Yao, MD  metroNIDAZOLE (FLAGYL) 500 MG tablet Take 1 tablet (500 mg total) by mouth 2 (two) times daily. 07/01/13   Richardean Canalavid H Yao, MD  PRESCRIPTION MEDICATION Take 1 tablet by mouth daily. An antibiotic    Historical Provider, MD  Rivaroxaban (XARELTO) 20 MG TABS tablet Take 1 tablet (20 mg total) by mouth daily with breakfast. 12/29/12   Russella DarAllison L Ellis, NP  sertraline (ZOLOFT) 100 MG tablet Take 100 mg by mouth daily.    Historical Provider, MD   BP 120/62  Pulse 80  Temp(Src) 97.9 F (36.6 C) (Oral)  Resp 16  Ht 5\' 4"  (1.626 m)  Wt 160 lb (72.576 kg)  BMI 27.45 kg/m2  SpO2 98%  LMP 09/13/2013  Breastfeeding? No Physical Exam  Nursing note and vitals reviewed. Constitutional: She is oriented to person, place, and time. She appears well-developed and well-nourished. No distress.  HENT:  Head: Normocephalic.  Cardiovascular: Normal rate and regular rhythm.   Pulmonary/Chest: Effort normal and breath sounds normal. No respiratory distress.  Abdominal: Soft. She exhibits no distension. There is no tenderness. There is no  rebound and no guarding.  Genitourinary:  Chaperone was present. IUD present in cervix. There is a whitish green discharge from cervix. No CMT. No pain in adenexa. No other lesions or rash to genital area. No lymphadenopathy.  Neurological: She is alert and oriented to person, place, and time.  Skin: Skin is warm and dry.  Few papular lesions to dorsal hands and forearms.  Psychiatric: She has a normal mood and affect. Her behavior is normal.    ED Course  Procedures   COORDINATION OF CARE:  Nursing notes reviewed. Vital signs reviewed. Initial pt interview and  examination performed.   Filed Vitals:   09/22/13 2155 09/23/13 0001  BP: 120/62 117/71  Pulse: 80   Temp: 97.9 F (36.6 C)   TempSrc: Oral   Resp: 16 17  Height: 5\' 4"  (1.626 m)   Weight: 160 lb (72.576 kg)   SpO2: 98% 100%    12:02 AM-patient seen and evaluated. She appears well no acute distress. Does not appear in significant pain. abdomen nontender. Symptoms concerning for possible STD versus UTI.  The patient also has secondary complaint of some persistent itching of the hands and skin. States that she has had symptoms for a least one month worse at night when she is trying to sleep. She is concerned for possible scabies. Does also mention that her daughter recently had head lice. Denies any itching or symptoms of the scalp.  Treatment plan initiated:Medications - No data to display  Results for orders placed during the hospital encounter of 09/22/13  CBC WITH DIFFERENTIAL      Result Value Ref Range   WBC 5.6  4.0 - 10.5 K/uL   RBC 3.75 (*) 3.87 - 5.11 MIL/uL   Hemoglobin 10.8 (*) 12.0 - 15.0 g/dL   HCT 16.1 (*) 09.6 - 04.5 %   MCV 88.8  78.0 - 100.0 fL   MCH 28.8  26.0 - 34.0 pg   MCHC 32.4  30.0 - 36.0 g/dL   RDW 40.9 (*) 81.1 - 91.4 %   Platelets 302  150 - 400 K/uL   Neutrophils Relative % 43  43 - 77 %   Neutro Abs 2.4  1.7 - 7.7 K/uL   Lymphocytes Relative 45  12 - 46 %   Lymphs Abs 2.6  0.7 - 4.0 K/uL   Monocytes Relative 8  3 - 12 %   Monocytes Absolute 0.4  0.1 - 1.0 K/uL   Eosinophils Relative 3  0 - 5 %   Eosinophils Absolute 0.2  0.0 - 0.7 K/uL   Basophils Relative 1  0 - 1 %   Basophils Absolute 0.0  0.0 - 0.1 K/uL  COMPREHENSIVE METABOLIC PANEL      Result Value Ref Range   Sodium 143  137 - 147 mEq/L   Potassium 4.1  3.7 - 5.3 mEq/L   Chloride 104  96 - 112 mEq/L   CO2 24  19 - 32 mEq/L   Glucose, Bld 98  70 - 99 mg/dL   BUN 12  6 - 23 mg/dL   Creatinine, Ser 7.82  0.50 - 1.10 mg/dL   Calcium 9.3  8.4 - 95.6 mg/dL   Total Protein 7.7  6.0 -  8.3 g/dL   Albumin 3.8  3.5 - 5.2 g/dL   AST 13  0 - 37 U/L   ALT 10  0 - 35 U/L   Alkaline Phosphatase 94  39 - 117 U/L   Total Bilirubin 0.2 (*)  0.3 - 1.2 mg/dL   GFR calc non Af Amer >90  >90 mL/min   GFR calc Af Amer >90  >90 mL/min   Anion gap 15  5 - 15  POC URINE PREG, ED      Result Value Ref Range   Preg Test, Ur NEGATIVE  NEGATIVE       MDM   Final diagnoses:  Possible exposure to STD  Scabies        Angus Seller, PA-C 09/23/13 [phone]

## 2013-09-23 NOTE — ED Provider Notes (Signed)
Medical screening examination/treatment/procedure(s) were performed by non-physician practitioner and as supervising physician I was immediately available for consultation/collaboration.   EKG Interpretation None        Olivia Mackielga M Sayre Witherington, MD 09/23/13 [phone]

## 2013-09-24 LAB — GC/CHLAMYDIA PROBE AMP
CT Probe RNA: NEGATIVE
GC Probe RNA: NEGATIVE

## 2014-01-13 ENCOUNTER — Encounter (HOSPITAL_COMMUNITY): Payer: Self-pay | Admitting: Emergency Medicine

## 2014-01-19 ENCOUNTER — Encounter (HOSPITAL_COMMUNITY): Payer: Self-pay | Admitting: Emergency Medicine

## 2014-01-19 ENCOUNTER — Emergency Department (HOSPITAL_COMMUNITY): Payer: Medicaid Other

## 2014-01-19 ENCOUNTER — Emergency Department (HOSPITAL_COMMUNITY)
Admission: EM | Admit: 2014-01-19 | Discharge: 2014-01-19 | Disposition: A | Discharge status: Home / Self Care | Payer: Medicaid Other | Attending: Emergency Medicine | Admitting: Emergency Medicine

## 2014-01-19 DIAGNOSIS — W010XXA Fall on same level from slipping, tripping and stumbling without subsequent striking against object, initial encounter: Secondary | ICD-10-CM | POA: Diagnosis not present

## 2014-01-19 DIAGNOSIS — Z87891 Personal history of nicotine dependence: Secondary | ICD-10-CM | POA: Diagnosis not present

## 2014-01-19 DIAGNOSIS — Z880 Allergy status to penicillin: Secondary | ICD-10-CM | POA: Diagnosis not present

## 2014-01-19 DIAGNOSIS — Y998 Other external cause status: Secondary | ICD-10-CM | POA: Diagnosis not present

## 2014-01-19 DIAGNOSIS — Z8619 Personal history of other infectious and parasitic diseases: Secondary | ICD-10-CM | POA: Insufficient documentation

## 2014-01-19 DIAGNOSIS — Y9389 Activity, other specified: Secondary | ICD-10-CM | POA: Diagnosis not present

## 2014-01-19 DIAGNOSIS — S6992XA Unspecified injury of left wrist, hand and finger(s), initial encounter: Secondary | ICD-10-CM | POA: Diagnosis present

## 2014-01-19 DIAGNOSIS — Z862 Personal history of diseases of the blood and blood-forming organs and certain disorders involving the immune mechanism: Secondary | ICD-10-CM | POA: Diagnosis not present

## 2014-01-19 DIAGNOSIS — S60222A Contusion of left hand, initial encounter: Secondary | ICD-10-CM | POA: Diagnosis not present

## 2014-01-19 DIAGNOSIS — Z8659 Personal history of other mental and behavioral disorders: Secondary | ICD-10-CM | POA: Insufficient documentation

## 2014-01-19 DIAGNOSIS — Y9289 Other specified places as the place of occurrence of the external cause: Secondary | ICD-10-CM | POA: Diagnosis not present

## 2014-01-19 DIAGNOSIS — Z86711 Personal history of pulmonary embolism: Secondary | ICD-10-CM | POA: Diagnosis not present

## 2014-01-19 DIAGNOSIS — Z79899 Other long term (current) drug therapy: Secondary | ICD-10-CM | POA: Insufficient documentation

## 2014-01-19 MED ORDER — OXYCODONE-ACETAMINOPHEN 5-325 MG PO TABS
1.0000 | ORAL_TABLET | Freq: Once | ORAL | Status: AC
  Administered 2014-01-19: 1 via ORAL
  Filled 2014-01-19: qty 1

## 2014-01-19 MED ORDER — TRAMADOL HCL 50 MG PO TABS: 50.0000 mg | ORAL_TABLET | Freq: Four times a day (QID) | ORAL | Status: DC | PRN

## 2014-01-19 NOTE — Discharge Instructions (Signed)
Rest, Ice intermittently (in the first 24-48 hours), Gentle compression with an Ace wrap, and elevate (Limb above the level of the heart)  For pain control you may take:  800mg  of ibuprofen (that is usually 4 over the counter pills)  3 times a day (take with food)   For breakthrough pain you may take Tramadol. Do not drink alcohol drive or operate heavy machinery when taking Tramadol.  Only use the arm sling for up to 2 days. Take the arm out and rotate the shoulder every 4 hours.   Do not hesitate to return to the emergency room for any new, worsening or concerning symptoms.  Please obtain primary care using resource guide below. But the minute you were seen in the emergency room and that they will need to obtain records for further outpatient management.    Emergency Department Resource Guide 1) Find a Doctor and Pay Out of Pocket Although you won't have to find out who is covered by your insurance plan, it is a good idea to ask around and get recommendations. You will then need to call the office and see if the doctor you have chosen will accept you as a new patient and what types of options they offer for patients who are self-pay. Some doctors offer discounts or will set up payment plans for their patients who do not have insurance, but you will need to ask so you aren't surprised when you get to your appointment.  2) Contact Your Local Health Department Not all health departments have doctors that can see patients for sick visits, but many do, so it is worth a call to see if yours does. If you don't know where your local health department is, you can check in your phone book. The CDC also has a tool to help you locate your state's health department, and many state websites also have listings of all of their local health departments.  3) Find a Walk-in Clinic If your illness is not likely to be very severe or complicated, you may want to try a walk in clinic. These are popping up all over  the country in pharmacies, drugstores, and shopping centers. They're usually staffed by nurse practitioners or physician assistants that have been trained to treat common illnesses and complaints. They're usually fairly quick and inexpensive. However, if you have serious medical issues or chronic medical problems, these are probably not your best option.  No Primary Care Doctor: - Call Health Connect at  [phone] - they can help you locate a primary care doctor that  accepts your insurance, provides certain services, etc. - Physician Referral Service- [phone]  Chronic Pain Problems: Organization         Address  Phone   Notes  [facility]  [phone] Patients need to be referred by their primary care doctor.   Medication Assistance: Organization         Address  Phone   Notes  [facility] [address] [place], [place] [phone] --Must be a resident of [place] [facility] -- Must have NO insurance coverage whatsoever (no Medicaid/ Medicare, etc.) -- The pt. MUST have a primary care doctor that directs their care regularly and follows them in the community   MedAssist  [phone]   [employer]  [phone]) [phone]    Agencies that provide inexpensive medical care: Retail buyerrganization         Address  Phone  Notes  [facility]  [phone]   [facility]    [phone]   [facility] [address] [place], [place] [address] [phone]   [facility] [address], [place] [phone]   Planned Parenthood    [phone]   [facility]    [phone]   [facility]  [address], [place] Phone:  [phone], Fax:  [phone] Hours of Operation:  9 am - 6 pm, M-F.  Also accepts Medicaid/Medicare and self-pay.  [facility]  [address]  [address], [place] Phone: [phone], Fax: [phone]. Hours of Operation:  8:30 am - 5:30 pm, M-F.  Also accepts Medicaid and self-pay.  [facility] [place] [address], [place] Phone: [phone]   [facility] [address] Natasha Bence [place], [place] [phone], Ext. [phone] Mondays & Thursdays: 7-9 AM.  First 15 patients are seen on a first come, first serve basis.    Medicaid-accepting [facility] Providers:  Organization         Address  Phone   Notes  [facility] [address], [place] [phone] Also accepts self-pay patients.  [facility] [address] Laurell Josephs [place], [place]  [phone]   [facility] [address], [place] [phone]   [facility] [address], [place] [phone]   Renaye Rakers [address], [place]   [phone] Only accepts [place] Access [place] patients after they have their name applied to their card.   Self-Pay (no insurance) in [facility]:  Organization         Address  Phone   Notes  Sickle Cell Patients, [facility] [address] [place], [place] [phone]   [facility] [address] [place], [place] [phone]   [facility]  [address], [place] [phone]   [facility]/Dr. Osei-Bonsu  [address] [place] or [address], [place] [phone] Phone number for both [place] and [place] locations is the same.  Urgent Medical and [facility] [address] [place] [phone]   [facility] [address], [place] or [address] [phone]   [facility] [address] [place] [phone], phone; [phone], fax Sees patients 1st and 3rd Saturday of every month.   Must not qualify for public or private insurance (i.e. Medicaid, Medicare, [place] Health Choice, Veterans' Benefits)  Household income should be no more than 200% of the poverty level The clinic cannot treat you if you are pregnant or think you are pregnant  Sexually transmitted diseases are not treated at the clinic.    Dental Care: Organization         Address  Phone  Notes  [facility] Department of [facility] [address] [place], [place] [phone] Accepts children up to age 50 who are enrolled in [place] or [place] Health Choice; pregnant women with a Medicaid card; and children who have applied for Medicaid or [place] Health Choice, but were declined, whose parents can pay a reduced fee at time of service.  [facility]  Department of [facility] [place]  [address], [place] [phone] Accepts children up to age 31 who are enrolled in [place] or [place] Health Choice; pregnant women with a Medicaid card; and children who have applied for Medicaid or [place] Health Choice, but were declined, whose parents can pay a reduced fee at time of service.  [facility]  [address], [place] [phone] Patients are seen by appointment only. Walk-ins are not accepted. [facility] will see patients 31 years of age and older. Monday - Tuesday (8am-5pm) Most Wednesdays (8:30-5pm) $30 per visit, cash only  [facility]  [address], [facility] [place] [phone] Patients are seen by appointment only. Walk-ins are not accepted. [facility] will see patients 31 years of age and older. One Wednesday Evening (Monthly: Volunteer Based).  $30 per visit, cash only  [employer] [facility]  [phone] for adults; Children under age 294, call Graduate Pediatric Dentistry at [phone]. Children aged 184-14, please call [phone] to request a pediatric application.  Dental services are  provided in all areas of dental care including fillings, crowns and bridges, complete and partial dentures, implants, gum treatment, root canals, and extractions. Preventive care is also provided. Treatment is provided to both adults and children. Patients are selected via a lottery and there is often a waiting list.   [facility] [address], [place]  [phone] [email or web address]   [facility] [address], [place], [place] [phone], Ext. [phone] Second and Fourth Thursday of each month, opens at 6:30 AM; Clinic ends at 9 AM.  Patients are seen on a first-come first-served basis, and a limited number are seen during each clinic.   [facility]  [address], [place], [place] [phone]   Eligibility Requirements You must have lived in [place], or [place] counties for at least the last three months.   You cannot be eligible for state or federal sponsored National Cityhealthcare insurance, including CIGNAVeterans Administration, [place]icaid, or [employer]Medicare.   You generally cannot be eligible for healthcare insurance through your employer.    How to apply: Eligibility screenings are held every Tuesday and Wednesday afternoon from 1:00 pm until 4:00 pm. You do not need an appointment for the interview!  [facility] [address], [place], [place] [phone]   [facility]  [phone]   [facility]  [phone]   [facility]  [phone]    Behavioral Health Resources in the Community: Intensive Outpatient Programs Organization         Address  Phone  Notes  [facility] [address], [place], [place] [phone]   [facility] [address], [place], [place] [phone]   [facility] [address], [place], [place]  [phone]   [facility] [address],  [place], [place]  [phone] or [phone]   Substance Abuse Resources Organization         Address  Phone  Notes  [facility]  [phone]   [facility]  [phone]   [facility]  [phone]   [facility]  [phone]   Residential & Outpatient Substance Abuse Program  [phone]   Psychological Services Organization         Address  Phone  Notes  [facility]  [place] [place]   [place] [address], [place] or [phone]    Mobile Crisis Teams Organization         Address  Phone  Notes  [facility], Mobile Crisis Care Unit  [phone]   [facility]  [address] [place], [place]   [place] Levels [address] [place] [phone]    Self-Help/Support Groups Organization         Address  Phone             Notes  [place]. [place] - variety of support groups  Heath Call for more information  Narcotics Anonymous (NA), [facility] [address], [employer] [place]  2 meetings at this location   Special educational needs teacher         Address  Phone  Notes  [facility]AP Residential Treatment [place],    [place]  [phone]   [facility]  [address] [place] 956213, [place], [place]   [place], [place] [phone] Admissions: 8am-3pm M-F  Incentives Substance [place] [address]    [place], [place] [phone]   [facility] [address] [place], [place], [place]   [facility] [address]  [place], [place]   [facility]ight Programs - Intensive Outpatient [address] Kristeen Mans [address], [place], [place]   [facility] [place] [facility] [place].,  [place], [place] [phone] or  [phone]   [facility] (RTS) [address] [employer], [place] Accepts Medicaid  [facility] [place] [address],  [place] [phone] Substance Abuse/Addiction Treatment   [facility] Organization         Address  Phone  Notes  [facility]  [phone]   Domenic Schwab, PhD [address] Arlis Porta [place], [place]   [phone] or [phone]   [place], [place] [phone]   [facility] [address], [place], [place] [phone] Insurance/Medicaid/sponsorship through [facility] and Families [address]                                    [place], [place] [phone] [place] [address], [place] [phone]    Dr. Adele Schilder  [phone]   [facility] Dept. 1) [address], [place] 2) [place] 3)  [place] [address], [place] [phone]  [phone]   [place] [phone] or [phone] (After Hours)

## 2014-01-19 NOTE — ED Notes (Signed)
Ace wrap applied, then sling.

## 2014-01-19 NOTE — ED Provider Notes (Signed)
CSN: [id number]     Arrival date & time 01/19/14  1836 History  This chart was scribed for non-physician practitioner, Wynetta EmeryNicole Tashon Capp, PA-C,working with Nelia Shiobert L Beaton, MD, by Karle PlumberJennifer Tensley, ED Scribe. This patient was seen in room TR07C/TR07C and the patient's care was started at 6:49 PM.  Chief Complaint  Patient presents with  . Fall  . Hand Injury   Patient is a 33 y.o. female presenting with fall and hand injury. The history is provided by the patient. No language interpreter was used.  Fall  Hand Injury   HPI Comments:  Nancy Francis is a 33 y.o. female with PMH of depression, anxiety and pulmonary thromboembolism who presents to the Emergency Department complaining of falling backwards last night on to concrete. She tried to catch herself with her left hand causing severe pain and swelling, mostly around the base of the thumb. She reports the pain at 10/10. She reports taking Ibuprofen and icing the hand with minimal relief of the pain. Trying to move the thumb or touching the area makes the pain worse. Denies alleviating factors. Denies numbness or tingling of the left hand, nausea, vomiting, LOC or head injury. Pt is right-hand dominant.  Past Medical History  Diagnosis Date  . Sickle cell trait   . Anemia   . Anxiety   . Depression   . Abnormal Pap smear 2010    repeat was negative  . PE (pulmonary thromboembolism)    Past Surgical History  Procedure Laterality Date  . Colposcopy    . Fracture surgery Right     3 surgeries   No family history on file. History  Substance Use Topics  . Smoking status: Former Smoker    Types: Cigarettes    Quit date: 10/12/2012  . Smokeless tobacco: Not on file  . Alcohol Use: Yes     Comment: NONE WHILE PREG   OB History    Gravida Para Term Preterm AB TAB SAB Ectopic Multiple Living   4 4 4  0 0 0 0 0 0 4     Review of Systems  Gastrointestinal: Negative for nausea and vomiting.  Musculoskeletal: Positive for  arthralgias.  Skin: Positive for color change. Negative for wound.  Neurological: Negative for syncope and numbness.    Allergies  Penicillins  Home Medications   Prior to Admission medications   Medication Sig Start Date End Date Taking? Authorizing Provider  permethrin (ELIMITE) 5 % cream Apply to affected area once and leave on for 14 hours 09/23/13   Phill MutterPeter S Dammen, PA-C  traMADol (ULTRAM) 50 MG tablet Take 1 tablet (50 mg total) by mouth every 6 (six) hours as needed. 01/19/14   Lucendia Leard, PA-C   Triage Vitals: BP 120/54 mmHg  Pulse 82  Temp(Src) 98.4 F (36.9 C) (Oral)  Resp 16  Ht 5\' 4"  (1.626 m)  Wt 170 lb (77.111 kg)  BMI 29.17 kg/m2  SpO2 98%  LMP 01/13/2014 (Approximate)  Breastfeeding? No Physical Exam  Constitutional: She is oriented to person, place, and time. She appears well-developed and well-nourished.  HENT:  Head: Normocephalic and atraumatic.  Eyes: EOM are normal.  Neck: Normal range of motion.  Cardiovascular: Normal rate.   Pulmonary/Chest: Effort normal.  Musculoskeletal: Normal range of motion.  Left hand with swelling and ecchymosis to thenar emennance and reduced ROM to MTP. Distal sensation grossly intact.   Neurological: She is alert and oriented to person, place, and time.  Skin: Skin is warm and dry.  Psychiatric: She has a normal mood and affect. Her behavior is normal.  Nursing note and vitals reviewed.   ED Course  Procedures (including critical care time) DIAGNOSTIC STUDIES: Oxygen Saturation is 98% on RA, normal by my interpretation.   COORDINATION OF CARE: 6:54 PM- Will order left hand X-Ray and pain medication. Pt verbalizes understanding and agrees to plan.  Medications  oxyCODONE-acetaminophen (PERCOCET/ROXICET) 5-325 MG per tablet 1 tablet (1 tablet Oral Given 01/19/14 1911)    Labs Review Labs Reviewed - No data to display  Imaging Review Dg Hand Complete Left  01/19/2014   CLINICAL DATA:  Pt fell last night and  tried to catch herself with left hand. Pain and swelling is at the 1st metacarpal  EXAM: LEFT HAND - COMPLETE 3+ VIEW  COMPARISON:  None.  FINDINGS: Probable old posttraumatic changes involving the ulnar styloid. No acute fracture or dislocation.  IMPRESSION: No acute fracture or dislocation.   Electronically Signed   By: Gordan PaymentSteve  Reid M.D.   On: 01/19/2014 19:07     EKG Interpretation None      MDM   Final diagnoses:  Hand contusion, left, initial encounter    Filed Vitals:   01/19/14 1840  BP: 120/54  Pulse: 82  Temp: 98.4 F (36.9 C)  TempSrc: Oral  Resp: 16  Height: 5\' 4"  (1.626 m)  Weight: 170 lb (77.111 kg)  SpO2: 98%    Medications  oxyCODONE-acetaminophen (PERCOCET/ROXICET) 5-325 MG per tablet 1 tablet (1 tablet Oral Given 01/19/14 1911)    Nancy StanfordDominique Sartwell is a 33 y.o. female presenting with Pain and swelling to the thenar eminence of left hand after falling backwards onto hand last night. She is neurovascularly intact. X-ray with no fracture. Patient will be given Ace wrap, sling and recommend RICE.    Evaluation does not show pathology that would require ongoing emergent intervention or inpatient treatment. Pt is hemodynamically stable and mentating appropriately. Discussed findings and plan with patient/guardian, who agrees with care plan. All questions answered. Return precautions discussed and outpatient follow up given.   New Prescriptions   TRAMADOL (ULTRAM) 50 MG TABLET    Take 1 tablet (50 mg total) by mouth every 6 (six) hours as needed.       I personally performed the services described in this documentation, which was scribed in my presence. The recorded information has been reviewed and is accurate.    Wynetta Emeryicole Raiquan Chandler, PA-C 01/19/14 1935  Nelia Shiobert L Beaton, MD 01/23/14 [phone]

## 2014-01-19 NOTE — ED Notes (Signed)
Pt reports that she fell backwards last night causing injury to left hand. Pt unable to move thumb. Pt has bruising to thumb area.

## 2014-06-02 ENCOUNTER — Emergency Department (HOSPITAL_COMMUNITY)
Admission: EM | Admit: 2014-06-02 | Discharge: 2014-06-02 | Disposition: A | Discharge status: Home / Self Care | Payer: Medicaid Other | Attending: Emergency Medicine | Admitting: Emergency Medicine

## 2014-06-02 ENCOUNTER — Emergency Department (HOSPITAL_COMMUNITY): Payer: Medicaid Other

## 2014-06-02 ENCOUNTER — Encounter (HOSPITAL_COMMUNITY): Payer: Self-pay | Admitting: Emergency Medicine

## 2014-06-02 DIAGNOSIS — Z79899 Other long term (current) drug therapy: Secondary | ICD-10-CM | POA: Insufficient documentation

## 2014-06-02 DIAGNOSIS — Z880 Allergy status to penicillin: Secondary | ICD-10-CM | POA: Insufficient documentation

## 2014-06-02 DIAGNOSIS — R51 Headache: Secondary | ICD-10-CM | POA: Diagnosis not present

## 2014-06-02 DIAGNOSIS — S4991XA Unspecified injury of right shoulder and upper arm, initial encounter: Secondary | ICD-10-CM | POA: Diagnosis not present

## 2014-06-02 DIAGNOSIS — X58XXXA Exposure to other specified factors, initial encounter: Secondary | ICD-10-CM | POA: Diagnosis not present

## 2014-06-02 DIAGNOSIS — Z862 Personal history of diseases of the blood and blood-forming organs and certain disorders involving the immune mechanism: Secondary | ICD-10-CM | POA: Diagnosis not present

## 2014-06-02 DIAGNOSIS — S46911A Strain of unspecified muscle, fascia and tendon at shoulder and upper arm level, right arm, initial encounter: Secondary | ICD-10-CM | POA: Diagnosis not present

## 2014-06-02 DIAGNOSIS — Y999 Unspecified external cause status: Secondary | ICD-10-CM | POA: Insufficient documentation

## 2014-06-02 DIAGNOSIS — Z86711 Personal history of pulmonary embolism: Secondary | ICD-10-CM | POA: Diagnosis not present

## 2014-06-02 DIAGNOSIS — Z720 Tobacco use: Secondary | ICD-10-CM | POA: Diagnosis not present

## 2014-06-02 DIAGNOSIS — S299XXA Unspecified injury of thorax, initial encounter: Secondary | ICD-10-CM | POA: Diagnosis not present

## 2014-06-02 DIAGNOSIS — Z8659 Personal history of other mental and behavioral disorders: Secondary | ICD-10-CM | POA: Insufficient documentation

## 2014-06-02 DIAGNOSIS — Y929 Unspecified place or not applicable: Secondary | ICD-10-CM | POA: Insufficient documentation

## 2014-06-02 DIAGNOSIS — Y939 Activity, unspecified: Secondary | ICD-10-CM | POA: Diagnosis not present

## 2014-06-02 DIAGNOSIS — T148XXA Other injury of unspecified body region, initial encounter: Secondary | ICD-10-CM

## 2014-06-02 DIAGNOSIS — Z3202 Encounter for pregnancy test, result negative: Secondary | ICD-10-CM | POA: Insufficient documentation

## 2014-06-02 DIAGNOSIS — M25511 Pain in right shoulder: Secondary | ICD-10-CM | POA: Diagnosis present

## 2014-06-02 LAB — CBC
HEMATOCRIT: 33.7 % — AB (ref 36.0–46.0)
HEMOGLOBIN: 11.1 g/dL — AB (ref 12.0–15.0)
MCH: 29.5 pg (ref 26.0–34.0)
MCHC: 32.9 g/dL (ref 30.0–36.0)
MCV: 89.6 fL (ref 78.0–100.0)
Platelets: 277 10*3/uL (ref 150–400)
RBC: 3.76 MIL/uL — ABNORMAL LOW (ref 3.87–5.11)
RDW: 16.9 % — AB (ref 11.5–15.5)
WBC: 5.7 10*3/uL (ref 4.0–10.5)

## 2014-06-02 LAB — BASIC METABOLIC PANEL
Anion gap: 7 (ref 5–15)
BUN: 8 mg/dL (ref 6–23)
CO2: 22 mmol/L (ref 19–32)
CREATININE: 0.78 mg/dL (ref 0.50–1.10)
Calcium: 8.9 mg/dL (ref 8.4–10.5)
Chloride: 109 mmol/L (ref 96–112)
Glucose, Bld: 126 mg/dL — ABNORMAL HIGH (ref 70–99)
POTASSIUM: 3.8 mmol/L (ref 3.5–5.1)
Sodium: 138 mmol/L (ref 135–145)

## 2014-06-02 LAB — D-DIMER, QUANTITATIVE: D-Dimer, Quant: 0.54 ug/mL-FEU — ABNORMAL HIGH (ref 0.00–0.48)

## 2014-06-02 LAB — BRAIN NATRIURETIC PEPTIDE: B NATRIURETIC PEPTIDE 5: 11.5 pg/mL (ref 0.0–100.0)

## 2014-06-02 LAB — POC URINE PREG, ED: Preg Test, Ur: NEGATIVE

## 2014-06-02 LAB — I-STAT TROPONIN, ED: Troponin i, poc: 0 ng/mL (ref 0.00–0.08)

## 2014-06-02 MED ORDER — METHOCARBAMOL 500 MG PO TABS
1000.0000 mg | ORAL_TABLET | Freq: Once | ORAL | Status: AC
  Administered 2014-06-02: 1000 mg via ORAL
  Filled 2014-06-02: qty 2

## 2014-06-02 MED ORDER — NAPROXEN 500 MG PO TABS: 500.0000 mg | ORAL_TABLET | Freq: Two times a day (BID) | ORAL | Status: DC

## 2014-06-02 MED ORDER — GI COCKTAIL ~~LOC~~
30.0000 mL | Freq: Once | ORAL | Status: AC
  Administered 2014-06-02: 30 mL via ORAL
  Filled 2014-06-02: qty 30

## 2014-06-02 MED ORDER — IOHEXOL 350 MG/ML SOLN
100.0000 mL | Freq: Once | INTRAVENOUS | Status: AC | PRN
  Administered 2014-06-02: 100 mL via INTRAVENOUS

## 2014-06-02 MED ORDER — KETOROLAC TROMETHAMINE 30 MG/ML IJ SOLN
30.0000 mg | Freq: Once | INTRAMUSCULAR | Status: AC
  Administered 2014-06-02: 30 mg via INTRAVENOUS
  Filled 2014-06-02: qty 1

## 2014-06-02 MED ORDER — METHOCARBAMOL 500 MG PO TABS: 500.0000 mg | ORAL_TABLET | Freq: Two times a day (BID) | ORAL | Status: DC

## 2014-06-02 NOTE — ED Provider Notes (Signed)
CSN: [id number]     Arrival date & time 06/02/14  0015 History  This chart was scribed for Chevy Virgo, MD by Leona CarryG. Clay Sherrill, ED Scribe. The patient was seen in D36C/D36C. The patient's care was started at 12:31 AM.     Chief Complaint  Patient presents with  . Shoulder Pain  . Chest Pain   Patient is a 34 y.o. female presenting with shoulder pain and chest pain. The history is provided by the patient. No language interpreter was used.  Shoulder Pain Location:  Shoulder Injury: no   Shoulder location:  R shoulder Pain details:    Quality:  Dull   Radiates to:  Back (right flank)   Severity:  Moderate   Duration:  2 days   Timing:  Constant   Progression:  Unchanged Chronicity:  New Handedness:  Right-handed Dislocation: no   Foreign body present:  No foreign bodies Relieved by:  Nothing Worsened by:  Nothing tried Ineffective treatments:  Heat (ibuprofen) Associated symptoms: back pain   Associated symptoms: no fever, no stiffness and no swelling   Risk factors: no recent illness   Chest Pain Associated symptoms: back pain and headache   Associated symptoms: no cough, no fever, no nausea, no shortness of breath and not vomiting    HPI Comments: Nancy Francis is a 34 y.o. female who presents to the Emergency Department complaining of right shoulder and back pain beginning two days ago. Patient reports that the pain is primarily located near her right shoulder blade but sometimes radiates around her right side to her chest. She denies any recent injury or trauma to the shoulder. She denies an vigorous exercises involving her shoulder or back. She has taken ibuprofen and applied a heating pad without relief of pain. LNMP 2/22. Patient reports that she has a 32 year old daughter and lifts her often. She denies any recent car or plane trips. Patient states that she uses and IUD. Patient also complains of a headache. Patient denies cough, fever, congestion, nausea, vomiting, or  diarrhea.   Patient does not have a PCP.  Past Medical History  Diagnosis Date  . Sickle cell trait   . Anemia   . Anxiety   . Depression   . Abnormal Pap smear 2010    repeat was negative  . PE (pulmonary thromboembolism)    Past Surgical History  Procedure Laterality Date  . Colposcopy    . Fracture surgery Right     3 surgeries   No family history on file. History  Substance Use Topics  . Smoking status: Current Some Day Smoker    Types: Cigarettes  . Smokeless tobacco: Not on file  . Alcohol Use: Yes   OB History    Gravida Para Term Preterm AB TAB SAB Ectopic Multiple Living   4 4 4  0 0 0 0 0 0 4     Review of Systems  Constitutional: Negative for fever.  HENT: Negative for congestion.   Respiratory: Negative for cough and shortness of breath.   Cardiovascular: Positive for chest pain.  Gastrointestinal: Negative for nausea, vomiting and diarrhea.  Musculoskeletal: Positive for back pain and arthralgias (right shoulder). Negative for stiffness.  Neurological: Positive for headaches.  All other systems reviewed and are negative.     Allergies  Penicillins  Home Medications   Prior to Admission medications   Medication Sig Start Date End Date Taking? Authorizing Provider  acetaminophen (TYLENOL) 500 MG tablet Take 500 mg by mouth  every 6 (six) hours as needed.    Historical Provider, MD  permethrin (ELIMITE) 5 % cream Apply to affected area once and leave on for 14 hours 09/23/13   Ivonne Andrew, PA-C  traMADol (ULTRAM) 50 MG tablet Take 1 tablet (50 mg total) by mouth every 6 (six) hours as needed. 01/19/14   Nicole Pisciotta, PA-C   Triage Vitals: BP 126/59 mmHg  Pulse 87  Temp(Src) 98.3 F (36.8 C) (Oral)  Resp 18  Ht  (1.626 m)  Wt 164 lb (74.39 kg)  BMI 28.14 kg/m2  SpO2 98%  LMP 05/05/2014 Physical Exam  Constitutional: She is oriented to person, place, and time. She appears well-developed and well-nourished. No distress.  HENT:  Head:  Normocephalic and atraumatic.  Mouth/Throat: Oropharynx is clear and moist.  No exudate.  Eyes: Conjunctivae and EOM are normal. Pupils are equal, round, and reactive to light.  Neck: Normal range of motion. Neck supple. No tracheal deviation present.  Cardiovascular: Normal rate, regular rhythm and normal heart sounds.   Pulmonary/Chest: Effort normal and breath sounds normal. No respiratory distress. She has no wheezes. She has no rales.  Abdominal: Soft. She exhibits no mass. There is no tenderness. There is no rebound and no guarding.  Hyperactive bowel sounds throughout.  Musculoskeletal: Normal range of motion. She exhibits no edema or tenderness.       Right shoulder: Normal. She exhibits no effusion and normal pulse.  No winging of the scapula. Negative neer's test of the right shoulder.  Neurological: She is alert and oriented to person, place, and time. She has normal reflexes. She displays normal reflexes.  Skin: Skin is warm and dry.  Psychiatric: She has a normal mood and affect. Her behavior is normal.  Nursing note and vitals reviewed.   ED Course  Procedures (including critical care time) DIAGNOSTIC STUDIES: Oxygen Saturation is 98% on room air, normal by my interpretation.    COORDINATION OF CARE:    Labs Review Labs Reviewed  CBC  BASIC METABOLIC PANEL  BRAIN NATRIURETIC PEPTIDE  I-STAT TROPOININ, ED  POC URINE PREG, ED    Imaging Review No results found.   EKG Interpretation None      MDM   Final diagnoses:  None    No pE pain is likely muscle strain.  NSAIDs and muscle relaxants.  Follow up with your PMD  I personally performed the services described in this documentation, which was scribed in my presence. The recorded information has been reviewed and is accurate.     Cy Blamer, MD 06/02/14 [phone]

## 2014-06-02 NOTE — ED Notes (Signed)
MD at bedside, transporter has arrived to take patient to radiology

## 2014-06-02 NOTE — ED Notes (Signed)
C/o R shoulder blade pain that radiates around R side towards chest since Saturday with nausea and headache.  Denies sob/vomiting.  Hx of PE- states it feels the same.  Denies any change with movement.

## 2014-06-02 NOTE — Discharge Instructions (Signed)

## 2014-06-02 NOTE — ED Notes (Signed)
Pt stable, ambulatory, pain mostly unchanged, states understanding of prescriptions

## 2014-09-04 IMAGING — CT CT ANGIO CHEST
2 of 9 series · 19 of 46 positions shown · IV contrast (APPLIED)
Comparison: Chest radiographs obtained earlier today.

CLINICAL DATA: Left chest and back pain and shortness of breath.
Elevated D-dimer. Status post vaginal delivery on 11/06/2012.

EXAM:
CT ANGIOGRAPHY CHEST WITH CONTRAST
TECHNIQUE: Multidetector CT imaging of the chest was performed using the
standard protocol during bolus administration of intravenous
contrast. Multiplanar CT image reconstructions including MIPs were
obtained to evaluate the vascular anatomy.
CONTRAST:  100mL OMNIPAQUE IOHEXOL 350 MG/ML SOLN

[Series 5: thins · axial · 0.63mm/px · z∈[-332,-122]mm · 16 of 238 slices shown]
[im 14/238  lung]
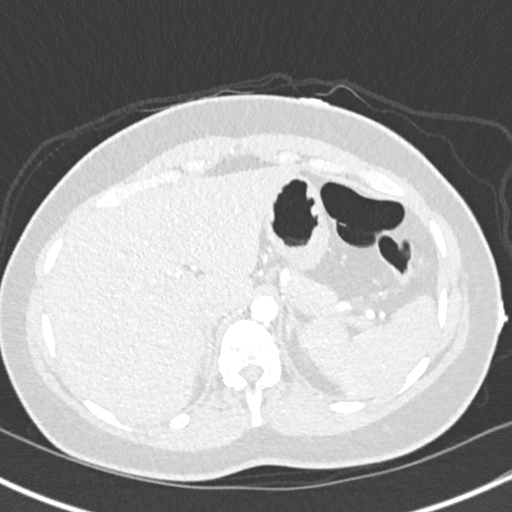
[im 27/238  soft-tissue]
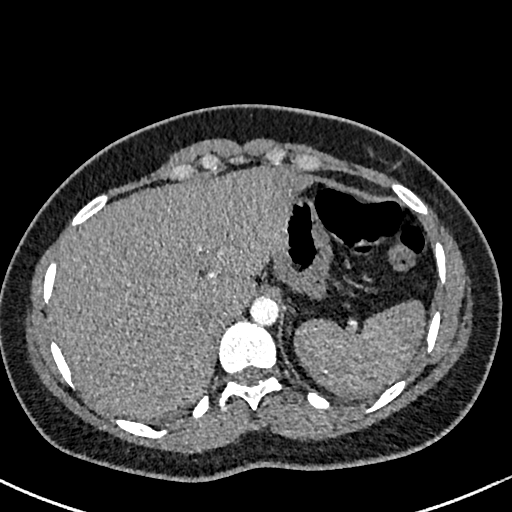
[im 40/238  lung]
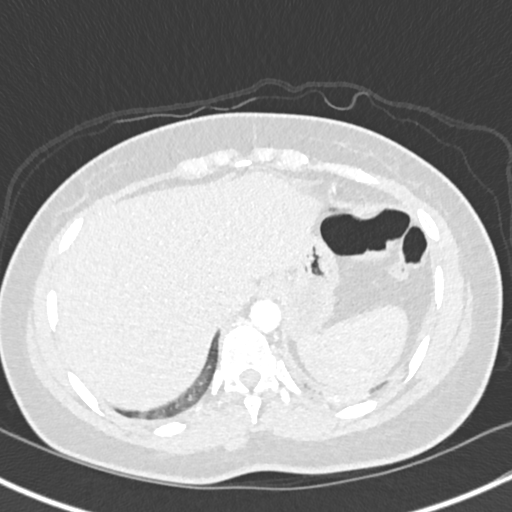
[im 53/238  soft-tissue]
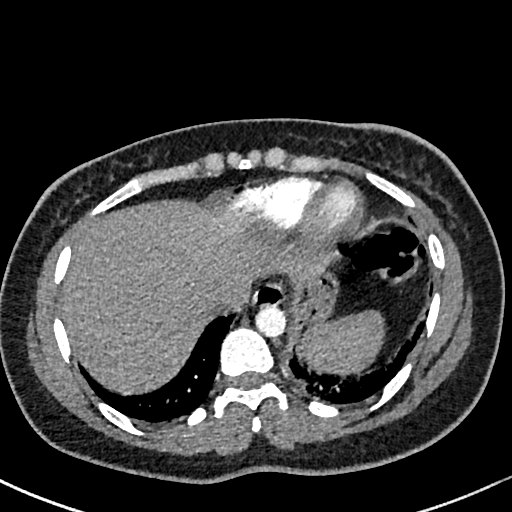
[im 66/238  lung]
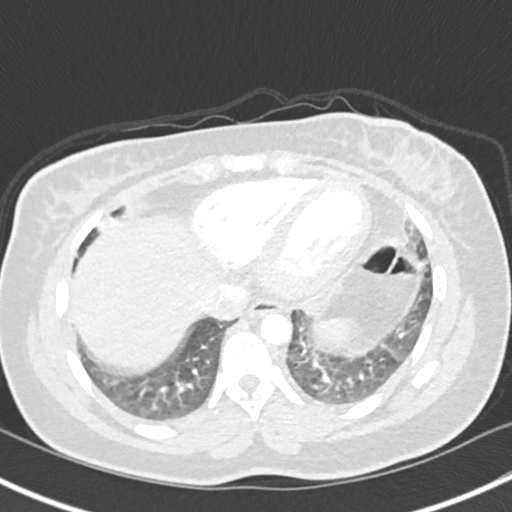
[im 80/238  soft-tissue]
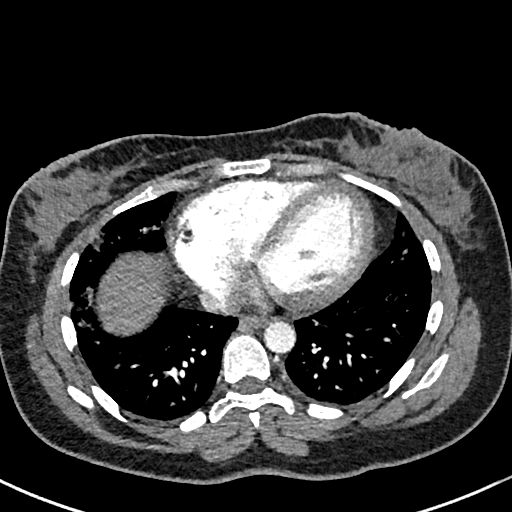
[im 93/238  lung]
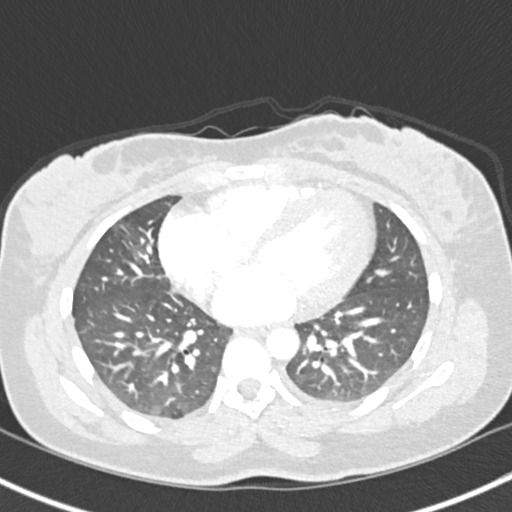
[im 106/238  soft-tissue]
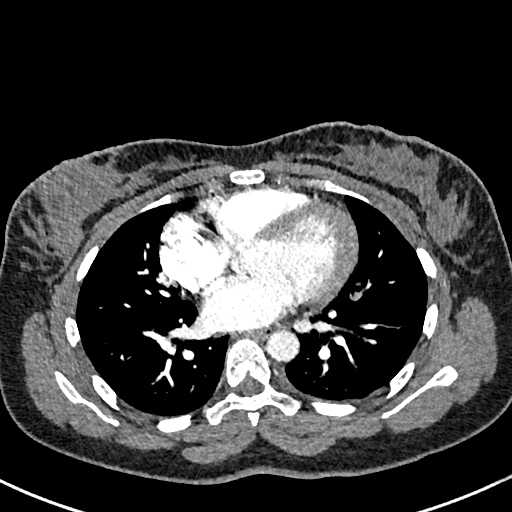
[im 132/238  lung]
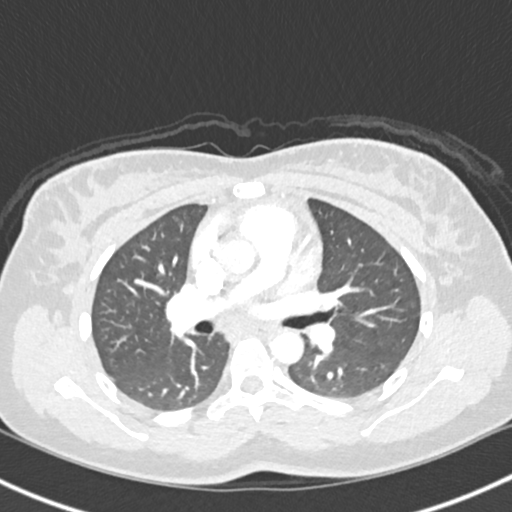
[im 145/238  soft-tissue]
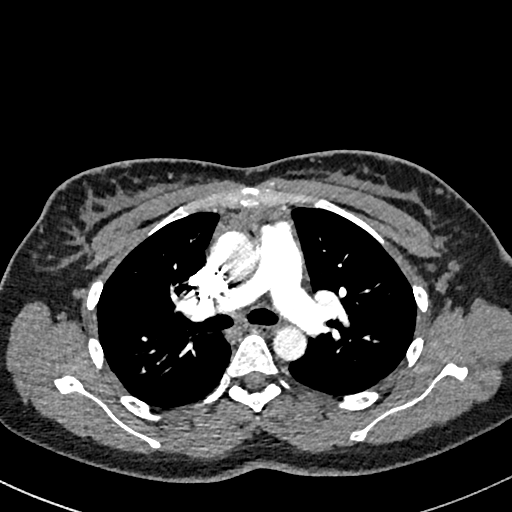
[im 159/238  lung]
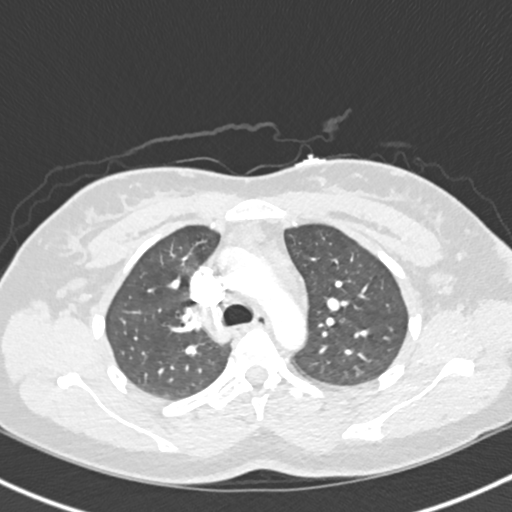
[im 172/238  soft-tissue]
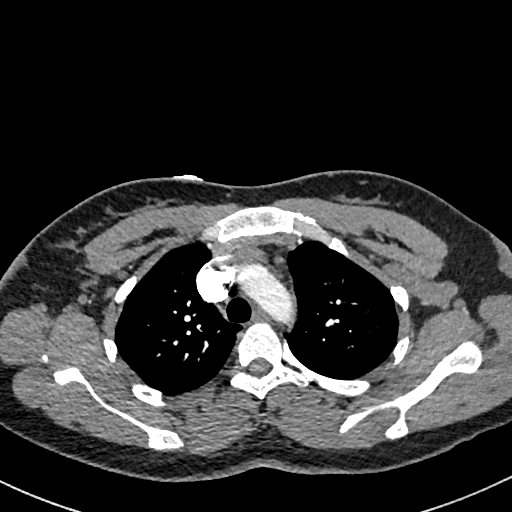
[im 185/238  lung]
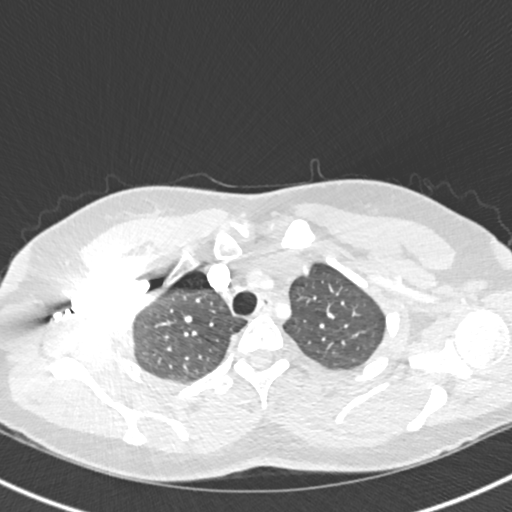
[im 198/238  soft-tissue]
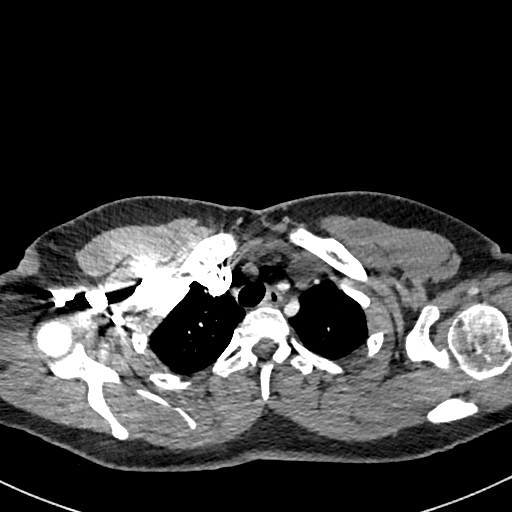
[im 211/238  lung]
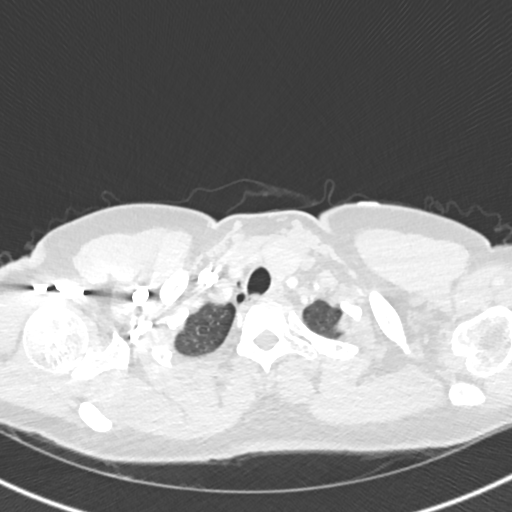
[im 224/238  soft-tissue]
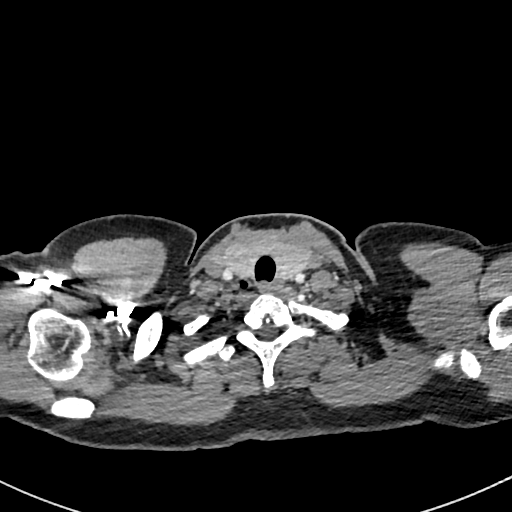

[Series 7: coronal mpr · coronal · 0.59mm/px · 3 of 105 slices shown]
[im 27/105  soft-tissue]
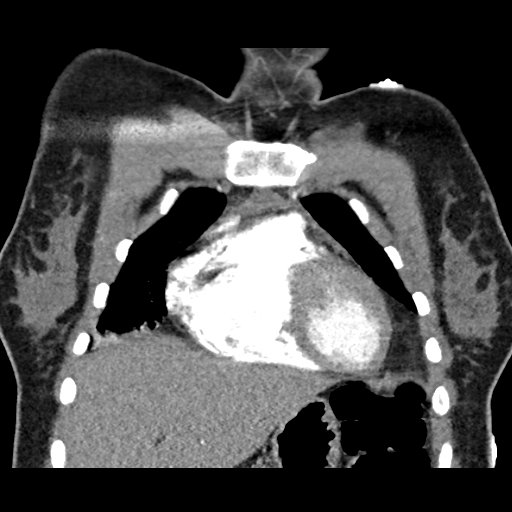
[im 53/105  soft-tissue]
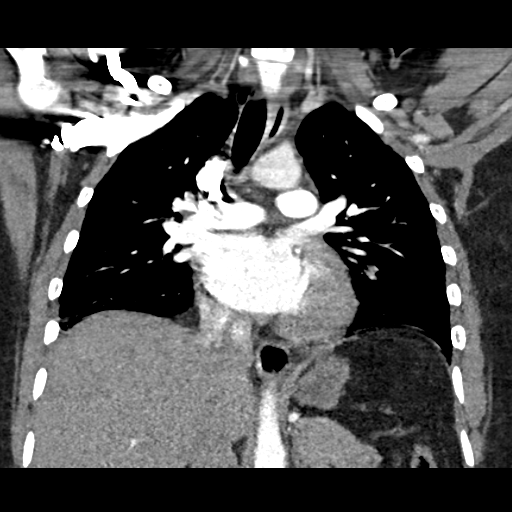
[im 79/105  soft-tissue]
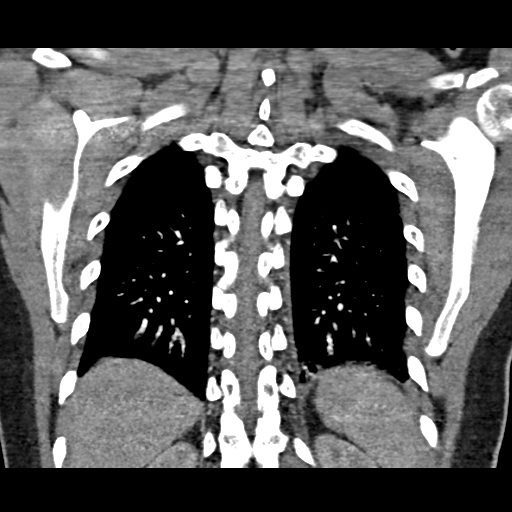

[19 of 46 positions shown; findings below may reference images not displayed]

FINDINGS: Small number of small to moderate-sized filling defects in the lower
lobe pulmonary arteries bilaterally. Mild atelectasis at both lung
bases. No lung masses. Borderline enlarged left upper mediastinal
pre-vascular lymph node with a short axis diameter of 8 mm on image
number 73. Otherwise, no enlarged lymph nodes are seen. Unremarkable
bones and upper abdomen.

Review of the MIP images confirms the above findings.
IMPRESSION: Small number of small to moderate-sized bilateral pulmonary emboli,
as described above.

Critical Value/emergent results were called by telephone at the time
of interpretation on 12/06/2012 at [DATE] Leif-Andreas Garseg , who
verbally acknowledged these results.

## 2015-07-06 ENCOUNTER — Encounter (HOSPITAL_COMMUNITY): Payer: Self-pay | Admitting: Emergency Medicine

## 2015-07-06 ENCOUNTER — Emergency Department (HOSPITAL_COMMUNITY)
Admission: EM | Admit: 2015-07-06 | Discharge: 2015-07-06 | Disposition: A | Discharge status: Home / Self Care | Payer: Medicaid Other | Attending: Emergency Medicine | Admitting: Emergency Medicine

## 2015-07-06 DIAGNOSIS — Z86711 Personal history of pulmonary embolism: Secondary | ICD-10-CM | POA: Diagnosis not present

## 2015-07-06 DIAGNOSIS — F17210 Nicotine dependence, cigarettes, uncomplicated: Secondary | ICD-10-CM | POA: Diagnosis not present

## 2015-07-06 DIAGNOSIS — F329 Major depressive disorder, single episode, unspecified: Secondary | ICD-10-CM | POA: Diagnosis not present

## 2015-07-06 DIAGNOSIS — Z862 Personal history of diseases of the blood and blood-forming organs and certain disorders involving the immune mechanism: Secondary | ICD-10-CM | POA: Insufficient documentation

## 2015-07-06 DIAGNOSIS — Z791 Long term (current) use of non-steroidal anti-inflammatories (NSAID): Secondary | ICD-10-CM | POA: Insufficient documentation

## 2015-07-06 DIAGNOSIS — F419 Anxiety disorder, unspecified: Secondary | ICD-10-CM | POA: Insufficient documentation

## 2015-07-06 DIAGNOSIS — M79645 Pain in left finger(s): Secondary | ICD-10-CM | POA: Diagnosis present

## 2015-07-06 DIAGNOSIS — Z79899 Other long term (current) drug therapy: Secondary | ICD-10-CM | POA: Diagnosis not present

## 2015-07-06 DIAGNOSIS — Z880 Allergy status to penicillin: Secondary | ICD-10-CM | POA: Insufficient documentation

## 2015-07-06 MED ORDER — IBUPROFEN 800 MG PO TABS: 800.0000 mg | ORAL_TABLET | Freq: Three times a day (TID) | ORAL | Status: DC

## 2015-07-06 MED ORDER — CLINDAMYCIN HCL 150 MG PO CAPS: 450.0000 mg | ORAL_CAPSULE | Freq: Three times a day (TID) | ORAL | Status: DC

## 2015-07-06 NOTE — ED Notes (Signed)
Pt has an acrylic nail to L ring finger that she states she cannot get off. Pt states she is having pain and has noticed green pus under the nail.

## 2015-07-06 NOTE — Discharge Instructions (Signed)
Fingertip Infection When an infection is around the nail, it is called a paronychia. When it appears over the tip of the finger, it is called a felon. These infections are due to minor injuries or cracks in the skin. If they are not treated properly, they can lead to bone infection and permanent damage to the fingernail. Incision and drainage is necessary if a pus pocket (an abscess) has formed. Antibiotics and pain medicine may also be needed. Keep your hand elevated for the next 2-3 days to reduce swelling and pain. If a pack was placed in the abscess, it should be removed in 1-2 days by your caregiver. Soak the finger in warm water for 20 minutes 4 times daily to help promote drainage. Keep the hands as dry as possible. Wear protective gloves with cotton liners. See your caregiver for follow-up care as recommended.  HOME CARE INSTRUCTIONS   Keep wound clean, dry and dressed as suggested by your caregiver.  Soak in warm salt water for fifteen minutes, four times per day for bacterial infections.  Your caregiver will prescribe an antibiotic if a bacterial infection is suspected. Take antibiotics as directed and finish the prescription, even if the problem appears to be improving before the medicine is gone.  Only take over-the-counter or prescription medicines for pain, discomfort, or fever as directed by your caregiver. SEEK IMMEDIATE MEDICAL CARE IF:  There is redness, swelling, or increasing pain in the wound.  Pus or any other unusual drainage is coming from the wound.  An unexplained oral temperature above 102 F (38.9 C) develops.  You notice a foul smell coming from the wound or dressing. MAKE SURE YOU:   Understand these instructions.  Monitor your condition.  Contact your caregiver if you are getting worse or not improving.   This information is not intended to replace advice given to you by your health care provider. Make sure you discuss any questions you have with your  health care provider.   Document Released: 04/07/2004 Document Revised: 05/23/2011 Document Reviewed: 08/18/2014 Elsevier Interactive Patient Education 2016 [employer]  

## 2015-07-06 NOTE — ED Provider Notes (Signed)
CSN: [id number]     Arrival date & time 07/06/15  1821 History  By signing my name below, I, [facility], attest that this documentation has been prepared under the direction and in the presence of Cheri Fowler, PA-C. Electronically Signed: Randell Patient, ED Scribe. 07/06/2015. 7:40 PM.     Chief Complaint  Patient presents with  . finger nail pain    The history is provided by the patient. No language interpreter was used.  HPI Comments: Nancy Francis is a 35 y.o. female with an hx of PE, anemia, and sickle cell trait who presents to the Emergency Department complaining of constant, mild, gradually worsening left ring finger pain onset yesterday. Patient reports that she had acrylic nails applied last week and removes them when they're loose. She states that she pulled the acrylic nail off but that she noticed pus when she squeezed her finger earlier today while at work. Per patient, she types at work. Denies hx of DM. Denies fever, chills, nausea, and vomiting. Allergic to penicillin.  Past Medical History  Diagnosis Date  . Sickle cell trait (HCC)   . Anemia   . Anxiety   . Depression   . Abnormal Pap smear 2010    repeat was negative  . PE (pulmonary thromboembolism) [facility])    Past Surgical History  Procedure Laterality Date  . Colposcopy    . Fracture surgery Right     3 surgeries   No family history on file. Social History  Substance Use Topics  . Smoking status: Current Some Day Smoker    Types: Cigarettes  . Smokeless tobacco: None  . Alcohol Use: Yes   OB History    Gravida Para Term Preterm AB TAB SAB Ectopic Multiple Living   0 0 0 0 0 0 4     Review of Systems  Constitutional: Negative for fever and chills.  Gastrointestinal: Negative for nausea and vomiting.  Musculoskeletal: Positive for myalgias (left ring finger).  Skin: Positive for wound.  All other systems reviewed and are negative.   Allergies  Penicillins  Home Medications    Prior to Admission medications   Medication Sig Start Date End Date Taking? Authorizing Provider  acetaminophen (TYLENOL) 500 MG tablet Take 1,000 mg by mouth every 6 (six) hours as needed for mild pain.     Historical Provider, MD  levonorgestrel (MIRENA) 20 MCG/24HR IUD 1 each by Intrauterine route once.    Historical Provider, MD  methocarbamol (ROBAXIN) 500 MG tablet Take 1 tablet (500 mg total) by mouth 2 (two) times daily. 06/02/14   April Palumbo, MD  naproxen (NAPROSYN) 500 MG tablet Take 1 tablet (500 mg total) by mouth 2 (two) times daily. 06/02/14   April Palumbo, MD  permethrin (ELIMITE) 5 % cream Apply to affected area once and leave on for 14 hours Patient not taking: Reported on 06/02/2014 09/23/13   Ivonne Andrew, PA-C  Prenatal Vit-Fe Fumarate-FA (PRENATAL MULTIVITAMIN) TABS tablet Take 1 tablet by mouth 2 (two) times a week.    Historical Provider, MD  traMADol (ULTRAM) 50 MG tablet Take 1 tablet (50 mg total) by mouth every 6 (six) hours as needed. Patient not taking: Reported on 06/02/2014 01/19/14   Joni Reining Pisciotta, PA-C   BP 110/89 mmHg  Pulse 87  Temp(Src) 98.4 F (36.9 C) (Oral)  Resp 16  SpO2 98%  LMP 07/03/2015 Physical Exam  Constitutional: She is oriented to person, place, and time. She appears well-developed and well-nourished.  Non-toxic appearance. She does not have a sickly appearance. She does not appear ill.  HENT:  Head: Normocephalic and atraumatic.  Mouth/Throat: Oropharynx is clear and moist.  Eyes: Conjunctivae are normal. Pupils are equal, round, and reactive to light.  Neck: Normal range of motion. Neck supple.  Cardiovascular: Normal rate, regular rhythm and normal heart sounds.   No murmur heard. Pulses:      Radial pulses are 2+ on the left side.  Capillary refill less than 3 seconds.  Pulmonary/Chest: Effort normal and breath sounds normal. No accessory muscle usage or stridor. No respiratory distress. She has no wheezes. She has no rhonchi.  She has no rales.  Abdominal: Soft. Bowel sounds are normal. She exhibits no distension. There is no tenderness.  Musculoskeletal: Normal range of motion.  Lymphadenopathy:    She has no cervical adenopathy.  Neurological: She is alert and oriented to person, place, and time.  Strength and sensation intact.   Skin: Skin is warm and dry.  Left ring finger distal pad TTP without induration or drainage.  Mild erythema.  No paronychia.  No felon.  Psychiatric: She has a normal mood and affect. Her behavior is normal.  Nursing note and vitals reviewed.   ED Course  Procedures   DIAGNOSTIC STUDIES: Oxygen Saturation is 98% on RA, normal by my interpretation.    COORDINATION OF CARE: 7:02 PM Will prescribe clindamycin and ibuprofen. Advised pt to return to the ED if symptoms worsen or red streaking or abscess appears. Advised pt to use warm water soaks. Discussed treatment plan with pt at bedside and pt agreed to plan.  MDM   Final diagnoses:  Finger pain, left   Patient presents with finger nail pain.  Recently taken off acrylic nails.  No known injury.  VSS, NAD.  No systemic symptoms.  No paronychia.  No felon.  Distal thumb pad mildly TTP.  No drainage.  Mild erythema.  Plan to discharge home with keflex for possible early infection.  Patient advised to perform warm soaks.  Discussed return precautions.  Follow up PCP as needed.  Patient agrees and acknowledges the above plan for discharge.   I personally performed the services described in this documentation, which was scribed in my presence. The recorded information has been reviewed and is accurate.    Cheri FowlerKayla Teela Narducci, PA-C 07/06/15 1940  Lavera Guiseana Duo Liu, MD 07/07/15 [phone]

## 2015-08-23 ENCOUNTER — Emergency Department (HOSPITAL_COMMUNITY): Payer: Medicaid Other

## 2015-08-23 ENCOUNTER — Emergency Department (HOSPITAL_COMMUNITY)
Admission: EM | Admit: 2015-08-23 | Discharge: 2015-08-23 | Disposition: A | Discharge status: Home / Self Care | Payer: Medicaid Other | Attending: Emergency Medicine | Admitting: Emergency Medicine

## 2015-08-23 ENCOUNTER — Encounter (HOSPITAL_COMMUNITY): Payer: Self-pay

## 2015-08-23 DIAGNOSIS — Z79899 Other long term (current) drug therapy: Secondary | ICD-10-CM | POA: Insufficient documentation

## 2015-08-23 DIAGNOSIS — F17210 Nicotine dependence, cigarettes, uncomplicated: Secondary | ICD-10-CM | POA: Diagnosis not present

## 2015-08-23 DIAGNOSIS — Z791 Long term (current) use of non-steroidal anti-inflammatories (NSAID): Secondary | ICD-10-CM | POA: Insufficient documentation

## 2015-08-23 DIAGNOSIS — M79672 Pain in left foot: Secondary | ICD-10-CM | POA: Diagnosis present

## 2015-08-23 MED ORDER — NAPROXEN 500 MG PO TABS: 500.0000 mg | ORAL_TABLET | Freq: Two times a day (BID) | ORAL | Status: DC

## 2015-08-23 NOTE — ED Provider Notes (Signed)
CSN: [id number]     Arrival date & time 08/23/15  1754 History   By signing my name below, I, Evon Slackerrance Branch, attest that this documentation has been prepared under the direction and in the presence of [employer] Elizabeth Nadeau, PA-C. Electronically Signed: Evon Slackerrance Branch, ED Scribe. 08/23/2015. 6:29 PM.     Chief Complaint  Patient presents with  . Foot Pain    Patient is a 35 y.o. female presenting with lower extremity pain. The history is provided by the patient. No language interpreter was used.  Foot Pain   HPI Comments: Nancy Francis is a 35 y.o. female who presents to the Emergency Department complaining of worsening throbbing left foot pain onset 1 day prior. Pt reports that she has associated swelling. Pt states that the pain began yesterday when getting up to walk. Pt states that the pain is worse when ambulating, bearing weight or moving her toes. Pt states she has tried ibuprofen with no relief. Pt denies injury or trauma to the foot. Pt denies new shoes or change in activity. Pt denies fever,  numbness or tingling. Pt denies ankle pain.   Past Medical History  Diagnosis Date  . Sickle cell trait (HCC)   . Anemia   . Anxiety   . Depression   . Abnormal Pap smear 2010    repeat was negative  . PE (pulmonary thromboembolism) [facility](HCC)    Past Surgical History  Procedure Laterality Date  . Colposcopy    . Fracture surgery Right     3 surgeries   No family history on file. Social History  Substance Use Topics  . Smoking status: Current Some Day Smoker    Types: Cigarettes  . Smokeless tobacco: None  . Alcohol Use: Yes   OB History    Gravida Para Term Preterm AB TAB SAB Ectopic Multiple Living   4 4 4  0 0 0 0 0 0 4     Review of Systems  Musculoskeletal: Positive for joint swelling and arthralgias.  Neurological: Negative for numbness.      Allergies  Penicillins  Home Medications   Prior to Admission medications   Medication Sig Start Date End Date  Taking? Authorizing Provider  acetaminophen (TYLENOL) 500 MG tablet Take 1,000 mg by mouth every 6 (six) hours as needed for mild pain.     Historical Provider, MD  clindamycin (CLEOCIN) 150 MG capsule Take 3 capsules (450 mg total) by mouth 3 (three) times daily. 07/06/15   Cheri FowlerKayla Rose, PA-C  ibuprofen (ADVIL,MOTRIN) 800 MG tablet Take 1 tablet (800 mg total) by mouth 3 (three) times daily. 07/06/15   Cheri FowlerKayla Rose, PA-C  levonorgestrel (MIRENA) 20 MCG/24HR IUD 1 each by Intrauterine route once.    Historical Provider, MD  methocarbamol (ROBAXIN) 500 MG tablet Take 1 tablet (500 mg total) by mouth 2 (two) times daily. 06/02/14   April Palumbo, MD  naproxen (NAPROSYN) 500 MG tablet Take 1 tablet (500 mg total) by mouth 2 (two) times daily. 08/23/15   Barrett HenleNicole Elizabeth Nadeau, PA-C  permethrin (ELIMITE) 5 % cream Apply to affected area once and leave on for 14 hours Patient not taking: Reported on 06/02/2014 09/23/13   Ivonne AndrewPeter Dammen, PA-C  Prenatal Vit-Fe Fumarate-FA (PRENATAL MULTIVITAMIN) TABS tablet Take 1 tablet by mouth 2 (two) times a week.    Historical Provider, MD  traMADol (ULTRAM) 50 MG tablet Take 1 tablet (50 mg total) by mouth every 6 (six) hours as needed. Patient not taking: Reported on 06/02/2014 01/19/14  Nicole Pisciotta, PA-C   BP 128/71 mmHg  Pulse 90  Temp(Src) 98 F (36.7 C)  Resp 18  SpO2 98%   Physical Exam  Constitutional: She is oriented to person, place, and time. She appears well-developed and well-nourished.  HENT:  Head: Normocephalic and atraumatic.  Eyes: Conjunctivae and EOM are normal. Right eye exhibits no discharge. Left eye exhibits no discharge. No scleral icterus.  Pulmonary/Chest: Effort normal.  Musculoskeletal:       Left foot: There is decreased range of motion (active, due to pain) and tenderness. There is no swelling, normal capillary refill, no crepitus, no deformity and no laceration.       Feet:  Full passive ROM of left toes foot and ankle ,TTP over  left first MTP joint and medial aspect of left foot arch. 2+ DP pulses, sensation grossly intact, cap refill less than 2 seconds. No swelling, erythema, warmth, abrasion, contusion or laceration   Neurological: She is alert and oriented to person, place, and time.  Nursing note and vitals reviewed.   ED Course  Procedures (including critical care time) DIAGNOSTIC STUDIES: Oxygen Saturation is 98% on RA, normal by my interpretation.    COORDINATION OF CARE: 6:25 PM-Discussed treatment plan with pt at bedside and pt agreed to plan.     Labs Review Labs Reviewed - No data to display  Imaging Review Dg Foot Complete Left  08/23/2015  CLINICAL DATA:  Foot pain along the first metatarsal upon waking up yesterday. Erythema. EXAM: LEFT FOOT - COMPLETE 3+ VIEW COMPARISON:  None. FINDINGS: No malalignment at the Lisfranc joint. Very minimal spurring along the first metatarsal head anteromedially, with a punctate calcification in the vicinity of the medial collateral ligament of the first MTP joint. There is potentially some mild soft tissue swelling in this area. I do not see a large erosions suggestive of gout. Small Achilles calcaneal spur. IMPRESSION: 1. Slight soft tissue swelling adjacent to the first metatarsal head, significance uncertain; no obvious erosive lesion characteristic of gout at this time. Electronically Signed   By: Gaylyn Rong M.D.   On: 08/23/2015 19:10      EKG Interpretation None      MDM   Final diagnoses:  Left foot pain   Patient presents with left foot pain that started yesterday, pain worse with ambulating or bearing weight. Denies fever or any known injury or trauma. VSS. Sam revealed tenderness over left first MTP joint and left medial distal foot arch. No signs of injury or trauma. Left lower extremity neurovascular intact. Pt is able ambulate but endorse pain, no bony abnormality or deformity, no erythema or excessive heat, no evidence of cellulitis or  septic joint. Left foot x-ray revealed slight soft tissue swelling adjacent to first metatarsal head, no obvious erosive lesion characteristic of gout. Minimal spurring along first metatarsal head noted. I suspect patient's symptoms are likely due to bone spurring seen on x-ray. Plan to discharge patient home with NSAIDs and symptomatic treatment. Discussed results and plan for discharge with patient. Advised patient to follow up with her PCP as needed. Discussed return precautions with patient.  I personally performed the services described in this documentation, which was scribed in my presence. The recorded information has been reviewed and is accurate.      Satira Sark [place], [place] 08/23/15 1928  Alvira Monday, MD 08/24/15 1225

## 2015-08-23 NOTE — ED Notes (Signed)
Patient complains of left foot pain x 1 day with ambulation, denies trauma

## 2015-08-23 NOTE — Discharge Instructions (Signed)
Take your medications as prescribed. I recommend resting, elevating and applying ice to affected area for 15-20 minutes 3-4 times daily to help with pain and swelling. Please follow up with a primary care provider from the Resource Guide provided below in 1 week if symptoms have not improved. Please return to the Emergency Department if symptoms worsen or new onset of fever, redness, swelling, warmth, numbness, tingling, weakness.

## 2015-08-23 NOTE — ED Notes (Signed)
PT refused  Ice pack .

## 2015-09-23 ENCOUNTER — Emergency Department (HOSPITAL_COMMUNITY)
Admission: EM | Admit: 2015-09-23 | Discharge: 2015-09-23 | Disposition: A | Discharge status: Home / Self Care | Payer: Medicaid Other | Attending: Emergency Medicine | Admitting: Emergency Medicine

## 2015-09-23 ENCOUNTER — Emergency Department (HOSPITAL_COMMUNITY): Payer: Medicaid Other

## 2015-09-23 ENCOUNTER — Encounter (HOSPITAL_COMMUNITY): Payer: Self-pay

## 2015-09-23 DIAGNOSIS — F17210 Nicotine dependence, cigarettes, uncomplicated: Secondary | ICD-10-CM | POA: Diagnosis not present

## 2015-09-23 DIAGNOSIS — Z3A01 Less than 8 weeks gestation of pregnancy: Secondary | ICD-10-CM | POA: Insufficient documentation

## 2015-09-23 DIAGNOSIS — O26891 Other specified pregnancy related conditions, first trimester: Secondary | ICD-10-CM | POA: Diagnosis not present

## 2015-09-23 DIAGNOSIS — R1030 Lower abdominal pain, unspecified: Secondary | ICD-10-CM | POA: Diagnosis not present

## 2015-09-23 DIAGNOSIS — O99331 Smoking (tobacco) complicating pregnancy, first trimester: Secondary | ICD-10-CM | POA: Insufficient documentation

## 2015-09-23 DIAGNOSIS — Z3490 Encounter for supervision of normal pregnancy, unspecified, unspecified trimester: Secondary | ICD-10-CM

## 2015-09-23 LAB — URINALYSIS, ROUTINE W REFLEX MICROSCOPIC
Bilirubin Urine: NEGATIVE
GLUCOSE, UA: NEGATIVE mg/dL
HGB URINE DIPSTICK: NEGATIVE
KETONES UR: NEGATIVE mg/dL
Nitrite: NEGATIVE
PH: 7.5 (ref 5.0–8.0)
Protein, ur: NEGATIVE mg/dL
SPECIFIC GRAVITY, URINE: 1.012 (ref 1.005–1.030)

## 2015-09-23 LAB — URINE MICROSCOPIC-ADD ON

## 2015-09-23 LAB — POC URINE PREG, ED: PREG TEST UR: POSITIVE — AB

## 2015-09-23 NOTE — Discharge Instructions (Signed)
Follow-up with your GYN doctor.  Return here as needed. °

## 2015-09-23 NOTE — ED Notes (Signed)
Patient here with lower abdominal pain and dysuria with nausea x 2 days. Reports mild vaginal discharge with same.

## 2015-09-23 NOTE — ED Notes (Addendum)
Nausea and lower abd pain slight vag d/c since last Sunday, LMP she is u nsure  Does hurt when she pees

## 2015-09-23 NOTE — ED Provider Notes (Signed)
CSN: [id number]     Arrival date & time 09/23/15  1110 History   First MD Initiated Contact with Patient 09/23/15 1125     Chief Complaint  Patient presents with  . Abdominal Pain     (Consider location/radiation/quality/duration/timing/severity/associated sxs/prior Treatment) HPI Patient presents to the emergency department with lower abdominal discomfort that started the last few days.  The patient states that she has had some frequency of urination.  Patient is unclear of her last menstrual period patient denies taking any medications prior to arrival. The patient denies chest pain, shortness of breath, headache,blurred vision, neck pain, fever, cough, weakness, numbness, dizziness, anorexia, edema, vomiting, diarrhea, rash, back pain, dysuria, hematemesis, bloody stool, near syncope, or syncope. Past Medical History  Diagnosis Date  . Sickle cell trait (HCC)   . Anemia   . Anxiety   . Depression   . Abnormal Pap smear 2010    repeat was negative  . PE (pulmonary thromboembolism) [facility](HCC)    Past Surgical History  Procedure Laterality Date  . Colposcopy    . Fracture surgery Right     3 surgeries   No family history on file. Social History  Substance Use Topics  . Smoking status: Current Some Day Smoker    Types: Cigarettes  . Smokeless tobacco: None  . Alcohol Use: Yes   OB History    Gravida Para Term Preterm AB TAB SAB Ectopic Multiple Living   4 4 4  0 0 0 0 0 0 4     Review of Systems  All other systems negative except as documented in the HPI. All pertinent positives and negatives as reviewed in the HPI.  Allergies  Penicillins  Home Medications   Prior to Admission medications   Medication Sig Start Date End Date Taking? Authorizing Provider  acetaminophen (TYLENOL) 500 MG tablet Take 1,000 mg by mouth every 6 (six) hours as needed for mild pain.     Historical Provider, MD  clindamycin (CLEOCIN) 150 MG capsule Take 3 capsules (450 mg total) by mouth 3  (three) times daily. 07/06/15   Cheri FowlerKayla Rose, PA-C  ibuprofen (ADVIL,MOTRIN) 800 MG tablet Take 1 tablet (800 mg total) by mouth 3 (three) times daily. 07/06/15   Cheri FowlerKayla Rose, PA-C  levonorgestrel (MIRENA) 20 MCG/24HR IUD 1 each by Intrauterine route once.    Historical Provider, MD  methocarbamol (ROBAXIN) 500 MG tablet Take 1 tablet (500 mg total) by mouth 2 (two) times daily. 06/02/14   April Palumbo, MD  naproxen (NAPROSYN) 500 MG tablet Take 1 tablet (500 mg total) by mouth 2 (two) times daily. 08/23/15   Barrett HenleNicole Elizabeth Nadeau, PA-C  permethrin (ELIMITE) 5 % cream Apply to affected area once and leave on for 14 hours Patient not taking: Reported on 06/02/2014 09/23/13   Ivonne AndrewPeter Dammen, PA-C  Prenatal Vit-Fe Fumarate-FA (PRENATAL MULTIVITAMIN) TABS tablet Take 1 tablet by mouth 2 (two) times a week.    Historical Provider, MD  traMADol (ULTRAM) 50 MG tablet Take 1 tablet (50 mg total) by mouth every 6 (six) hours as needed. Patient not taking: Reported on 06/02/2014 01/19/14   Joni ReiningNicole Pisciotta, PA-C   BP 120/96 mmHg  Pulse 76  Temp(Src) 99.2 F (37.3 C) (Oral)  Resp 18  SpO2 100%  LMP 08/27/2015 Physical Exam  Constitutional: She is oriented to person, place, and time. She appears well-developed and well-nourished. No distress.  HENT:  Head: Normocephalic and atraumatic.  Mouth/Throat: Oropharynx is clear and moist.  Eyes: Pupils are equal,  round, and reactive to light.  Neck: Normal range of motion. Neck supple.  Cardiovascular: Normal rate, regular rhythm and normal heart sounds.  Exam reveals no gallop and no friction rub.   No murmur heard. Pulmonary/Chest: Effort normal and breath sounds normal. No respiratory distress. She has no wheezes.  Abdominal: Soft. Bowel sounds are normal. She exhibits no distension. There is no tenderness. There is no rebound and no guarding.  Neurological: She is alert and oriented to person, place, and time. She exhibits normal muscle tone. Coordination normal.   Skin: Skin is warm and dry. No rash noted. No erythema.  Psychiatric: She has a normal mood and affect. Her behavior is normal.  Nursing note and vitals reviewed.   ED Course  Procedures (including critical care time) Labs Review Labs Reviewed  URINALYSIS, ROUTINE W REFLEX MICROSCOPIC (NOT AT [facility]) - Abnormal; Notable for the following:    APPearance CLOUDY (*)    Leukocytes, UA MODERATE (*)    All other components within normal limits  URINE MICROSCOPIC-ADD ON - Abnormal; Notable for the following:    Squamous Epithelial / LPF 6-30 (*)    Bacteria, UA RARE (*)    All other components within normal limits  POC URINE PREG, ED - Abnormal; Notable for the following:    Preg Test, Ur POSITIVE (*)    All other components within normal limits    Imaging Review US Ob Comp Less 14 Wks  09/23/2015  CLINICAL DATA:  Lower abdominal pain, nausea for 2 days EXAM: OBSTETRIC <14 WK [place] AND TRANSVAGINAL OB US TECHNIQUE: Both transabdominal and transvaginal ultrasound examinations were performed for complete evaluation of the gestation as well as the maternal uterus, adnexal regions, and pelvic cul-de-sac. Transvaginal technique was performed to assess early pregnancy. COMPARISON:  None. FINDINGS: Intrauterine gestational sac: Visualized Yolk sac:  Visualized Embryo:  Visualized Cardiac Activity: Visualized Heart Rate: 139  bpm MSD:   mm    w     d CRL:  9.7  mm   Seven w   0 d                  [place] EDC: 05/11/2016 Subchorionic hemorrhage:  None visualized. Maternal uterus/adnexae: No adnexal mass.  Trace free fluid. IMPRESSION: Seven week 0 day intrauterine pregnancy. Fetal heart rate 139 beats per minute. No acute maternal findings. Electronically Signed   By: Charlett Nose M.D.   On: 09/23/2015 13:31   US Ob Transvaginal  09/23/2015  CLINICAL DATA:  Lower abdominal pain, nausea for 2 days EXAM: OBSTETRIC <14 WK [place] AND TRANSVAGINAL OB US TECHNIQUE: Both transabdominal and transvaginal ultrasound  examinations were performed for complete evaluation of the gestation as well as the maternal uterus, adnexal regions, and pelvic cul-de-sac. Transvaginal technique was performed to assess early pregnancy. COMPARISON:  None. FINDINGS: Intrauterine gestational sac: Visualized Yolk sac:  Visualized Embryo:  Visualized Cardiac Activity: Visualized Heart Rate: 139  bpm MSD:   mm    w     d CRL:  9.7  mm   Seven w   0 d                  [place] EDC: 05/11/2016 Subchorionic hemorrhage:  None visualized. Maternal uterus/adnexae: No adnexal mass.  Trace free fluid. IMPRESSION: Seven week 0 day intrauterine pregnancy. Fetal heart rate 139 beats per minute. No acute maternal findings. Electronically Signed   By: Charlett Nose M.D.   On: 09/23/2015 13:31   I  have personally reviewed and evaluated these images and lab results as part of my medical decision-making.    Patient is advised to return here as needed.  Patient agrees the plan and all questions were answered.  I advised follow-up with her GYN doctor.  Patient agrees to the plan.   Charlestine Night, PA-C 09/23/15 1435  Gerhard Munch, MD 09/24/15 1558

## 2015-10-18 ENCOUNTER — Inpatient Hospital Stay (HOSPITAL_COMMUNITY)
Admission: AD | Admit: 2015-10-18 | Discharge: 2015-10-18 | Disposition: A | Discharge status: Home / Self Care | Payer: Medicaid Other | Source: Ambulatory Visit | Attending: Obstetrics & Gynecology | Admitting: Obstetrics & Gynecology

## 2015-10-18 ENCOUNTER — Inpatient Hospital Stay (HOSPITAL_BASED_OUTPATIENT_CLINIC_OR_DEPARTMENT_OTHER)
Admit: 2015-10-18 | Discharge: 2015-10-18 | Disposition: A | Discharge status: Home / Self Care | Payer: Medicaid Other | Attending: Certified Nurse Midwife | Admitting: Certified Nurse Midwife

## 2015-10-18 ENCOUNTER — Encounter (HOSPITAL_COMMUNITY): Payer: Self-pay | Admitting: Certified Nurse Midwife

## 2015-10-18 DIAGNOSIS — O218 Other vomiting complicating pregnancy: Secondary | ICD-10-CM | POA: Insufficient documentation

## 2015-10-18 DIAGNOSIS — O219 Vomiting of pregnancy, unspecified: Secondary | ICD-10-CM

## 2015-10-18 DIAGNOSIS — F419 Anxiety disorder, unspecified: Secondary | ICD-10-CM | POA: Diagnosis not present

## 2015-10-18 DIAGNOSIS — I82622 Acute embolism and thrombosis of deep veins of left upper extremity: Secondary | ICD-10-CM | POA: Insufficient documentation

## 2015-10-18 DIAGNOSIS — O98511 Other viral diseases complicating pregnancy, first trimester: Secondary | ICD-10-CM | POA: Insufficient documentation

## 2015-10-18 DIAGNOSIS — O99341 Other mental disorders complicating pregnancy, first trimester: Secondary | ICD-10-CM | POA: Diagnosis not present

## 2015-10-18 DIAGNOSIS — Z3A10 10 weeks gestation of pregnancy: Secondary | ICD-10-CM | POA: Insufficient documentation

## 2015-10-18 DIAGNOSIS — B9789 Other viral agents as the cause of diseases classified elsewhere: Secondary | ICD-10-CM

## 2015-10-18 DIAGNOSIS — R112 Nausea with vomiting, unspecified: Secondary | ICD-10-CM | POA: Diagnosis present

## 2015-10-18 DIAGNOSIS — Z86711 Personal history of pulmonary embolism: Secondary | ICD-10-CM | POA: Insufficient documentation

## 2015-10-18 DIAGNOSIS — J069 Acute upper respiratory infection, unspecified: Secondary | ICD-10-CM | POA: Diagnosis not present

## 2015-10-18 DIAGNOSIS — D573 Sickle-cell trait: Secondary | ICD-10-CM | POA: Diagnosis not present

## 2015-10-18 DIAGNOSIS — O99511 Diseases of the respiratory system complicating pregnancy, first trimester: Secondary | ICD-10-CM | POA: Diagnosis not present

## 2015-10-18 DIAGNOSIS — O99331 Smoking (tobacco) complicating pregnancy, first trimester: Secondary | ICD-10-CM | POA: Diagnosis not present

## 2015-10-18 DIAGNOSIS — O2231 Deep phlebothrombosis in pregnancy, first trimester: Secondary | ICD-10-CM | POA: Diagnosis not present

## 2015-10-18 DIAGNOSIS — F329 Major depressive disorder, single episode, unspecified: Secondary | ICD-10-CM | POA: Diagnosis not present

## 2015-10-18 LAB — URINE MICROSCOPIC-ADD ON: WBC UA: NONE SEEN WBC/hpf (ref 0–5)

## 2015-10-18 LAB — ANTITHROMBIN III: AntiThromb III Func: 109 % (ref 75–120)

## 2015-10-18 LAB — URINALYSIS, ROUTINE W REFLEX MICROSCOPIC
BILIRUBIN URINE: NEGATIVE
Glucose, UA: NEGATIVE mg/dL
KETONES UR: NEGATIVE mg/dL
LEUKOCYTES UA: NEGATIVE
NITRITE: NEGATIVE
PROTEIN: NEGATIVE mg/dL
Specific Gravity, Urine: 1.02 (ref 1.005–1.030)
pH: 6.5 (ref 5.0–8.0)

## 2015-10-18 MED ORDER — PROMETHAZINE HCL 25 MG PO TABS
25.0000 mg | ORAL_TABLET | Freq: Four times a day (QID) | ORAL | 1 refills | Status: DC | PRN
Start: 2015-10-18 — End: 2016-03-02

## 2015-10-18 MED ORDER — ENOXAPARIN SODIUM 150 MG/ML ~~LOC~~ SOLN
1.5000 mg/kg | Freq: Once | SUBCUTANEOUS | Status: AC
  Administered 2015-10-18: 130 mg via SUBCUTANEOUS
  Filled 2015-10-18: qty 0.87

## 2015-10-18 MED ORDER — ENOXAPARIN SODIUM 150 MG/ML ~~LOC~~ SOLN: 1.0000 mg/kg | Freq: Two times a day (BID) | SUBCUTANEOUS | Status: DC

## 2015-10-18 MED ORDER — ENOXAPARIN SODIUM 100 MG/ML ~~LOC~~ SOLN: 1.0000 mg/kg | Freq: Two times a day (BID) | SUBCUTANEOUS | 6 refills | Status: DC

## 2015-10-18 MED ORDER — PROMETHAZINE HCL 25 MG PO TABS
25.0000 mg | ORAL_TABLET | Freq: Four times a day (QID) | ORAL | Status: DC | PRN
  Administered 2015-10-18: 25 mg via ORAL
  Filled 2015-10-18: qty 1

## 2015-10-18 NOTE — Progress Notes (Addendum)
VASCULAR LAB PRELIMINARY  PRELIMINARY  PRELIMINARY  PRELIMINARY  Left upper extremity venous duplex completed.    Preliminary report:  There is acute, partially occlusive DVT noted in one of the two branches of the left brachial vein, extending approximately 2 inches from the [facility].  All other veins appear thrombus free.   Deckard Stuber, RVT 10/18/2015, 7:23 PM

## 2015-10-18 NOTE — Discharge Instructions (Signed)
Deep Vein Thrombosis °A deep vein thrombosis (DVT) is a blood clot (thrombus) that usually occurs in a deep, larger vein of the lower leg or the pelvis, or in an upper extremity such as the arm. These are dangerous and can lead to serious and even life-threatening complications if the clot travels to the lungs. °A DVT can damage the valves in your leg veins so that instead of flowing upward, the blood pools in the lower leg. This is called post-thrombotic syndrome, and it can result in pain, swelling, discoloration, and sores on the leg. °CAUSES °A DVT is caused by the formation of a blood clot in your leg, pelvis, or arm. Usually, several things contribute to the formation of blood clots. A clot may develop when: °· Your blood flow slows down. °· Your vein becomes damaged in some way. °· You have a condition that makes your blood clot more easily. °RISK FACTORS °A DVT is more likely to develop in: °· People who are older, especially over 60 years of age. °· People who are overweight (obese). °· People who sit or lie still for a long time, such as during long-distance travel (over 4 hours), bed rest, hospitalization, or during recovery from certain medical conditions like a stroke. °· People who do not engage in much physical activity (sedentary lifestyle). °· People who have chronic breathing disorders. °· People who have a personal or family history of blood clots or blood clotting disease. °· People who have peripheral vascular disease (PVD), diabetes, or some types of cancer. °· People who have heart disease, especially if the person had a recent heart attack or has congestive heart failure. °· People who have neurological diseases that affect the legs (leg paresis). °· People who have had a traumatic injury, such as breaking a hip or leg. °· People who have recently had major or lengthy surgery, especially on the hip, knee, or abdomen. °· People who have had a central line placed inside a large vein. °· People  who take medicines that contain the hormone estrogen. These include birth control pills and hormone replacement therapy. °· Pregnancy or during childbirth or the postpartum period. °· Long plane flights (over 8 hours). °SIGNS AND SYMPTOMS °Symptoms of a DVT can include:  °· Swelling of your leg or arm, especially if one side is much worse. °· Warmth and redness of your leg or arm, especially if one side is much worse. °· Pain in your arm or leg. If the clot is in your leg, symptoms may be more noticeable or worse when you stand or walk. °· A feeling of pins and needles, if the clot is in the arm. °The symptoms of a DVT that has traveled to the lungs (pulmonary embolism, PE) usually start suddenly and include: °· Shortness of breath while active or at rest. °· Coughing or coughing up blood or blood-tinged mucus. °· Chest pain that is often worse with deep breaths. °· Rapid or irregular heartbeat. °· Feeling light-headed or dizzy. °· Fainting. °· Feeling anxious. °· Sweating. °There may also be pain and swelling in a leg if that is where the blood clot started. °These symptoms may represent a serious problem that is an emergency. Do not wait to see if the symptoms will go away. Get medical help right away. Call your local emergency services (911 in the [place] Do not drive yourself to the hospital. °DIAGNOSIS °Your health care provider will take a medical history and perform a physical exam. You may also   have other tests, including: °· Blood tests to assess the clotting properties of your blood. °· Imaging tests, such as CT, ultrasound, MRI, X-ray, and other tests to see if you have clots anywhere in your body. °TREATMENT °After a DVT is identified, it can be treated. The type of treatment that you receive depends on many factors, such as the cause of your DVT, your risk for bleeding or developing more clots, and other medical conditions that you have. Sometimes, a combination of treatments is necessary. Treatment  options may be combined and include: °· Monitoring the blood clot with ultrasound. °· Taking medicines by mouth, such as newer blood thinners (anticoagulants), thrombolytics, or warfarin. °· Taking anticoagulant medicine by injection or through an IV tube. °· Wearing compression stockings or using different types of devices. °· Surgery (rare) to remove the blood clot or to place a filter in your abdomen to stop the blood clot from traveling to your lungs. °Treatments for a DVT are often divided into immediate treatment and long-term treatment (up to 3 months after DVT). You can work with your health care provider to choose the treatment program that is best for you. °HOME CARE INSTRUCTIONS °If you are taking a newer oral anticoagulant: °· Take the medicine every single day at the same time each day. °· Understand what foods and drugs interact with this medicine. °· Understand that there are no regular blood tests required when using this medicine. °· Understand the side effects of this medicine, including excessive bruising or bleeding. Ask your health care provider or pharmacist about other possible side effects. °If you are taking warfarin: °· Understand how to take warfarin and know which foods can affect how warfarin works in your body. °· Understand that it is dangerous to take too much or too little warfarin. Too much warfarin increases the risk of bleeding. Too little warfarin continues to allow the risk for blood clots. °· Follow your PT and INR blood testing schedule. The PT and INR results allow your health care provider to adjust your dose of warfarin. It is very important that you have your PT and INR tested as often as told by your health care provider. °· Avoid major changes in your diet, or tell your health care provider before you change your diet. Arrange a visit with a registered dietitian to answer your questions. Many foods, especially foods that are high in vitamin K, can interfere with warfarin  and affect the PT and INR results. Eat a consistent amount of foods that are high in vitamin K, such as: °¨ Spinach, kale, broccoli, cabbage, collard greens, turnip greens, Brussels sprouts, peas, cauliflower, seaweed, and parsley. °¨ Beef liver and pork liver. °¨ Green tea. °¨ Soybean oil. °· Tell your health care provider about any and all medicines, vitamins, and supplements that you take, including aspirin and other over-the-counter anti-inflammatory medicines. Be especially cautious with aspirin and anti-inflammatory medicines. Do not take those before you ask your health care provider if it is safe to do so. This is important because many medicines can interfere with warfarin and affect the PT and INR results. °· Do not start or stop taking any over-the-counter or prescription medicine unless your health care provider or pharmacist tells you to do so. °If you take warfarin, you will also need to do these things: °· Hold pressure over cuts for longer than usual. °· Tell your dentist and other health care providers that you are taking warfarin before you have any procedures in which   bleeding may occur. °· Avoid alcohol or drink very small amounts. Tell your health care provider if you change your alcohol intake. °· Do not use tobacco products, including cigarettes, chewing tobacco, and e-cigarettes. If you need help quitting, ask your health care provider. °· Avoid contact sports. °General Instructions °· Take over-the-counter and prescription medicines only as told by your health care provider. Anticoagulant medicines can have side effects, including easy bruising and difficulty stopping bleeding. If you are prescribed an anticoagulant, you will also need to do these things: °¨ Hold pressure over cuts for longer than usual. °¨ Tell your dentist and other health care providers that you are taking anticoagulants before you have any procedures in which bleeding may occur. °¨ Avoid contact sports. °· Wear a medical  alert bracelet or carry a medical alert card that says you have had a PE. °· Ask your health care provider how soon you can go back to your normal activities. Stay active to prevent new blood clots from forming. °· Make sure to exercise while traveling or when you have been sitting or standing for a long period of time. It is very important to exercise. Exercise your legs by walking or by tightening and relaxing your leg muscles often. Take frequent walks. °· Wear compression stockings as told by your health care provider to help prevent more blood clots from forming. °· Do not use tobacco products, including cigarettes, chewing tobacco, and e-cigarettes. If you need help quitting, ask your health care provider. °· Keep all follow-up appointments with your health care provider. This is important. °PREVENTION °Take these actions to decrease your risk of developing another DVT: °· Exercise regularly. For at least 30 minutes every day, engage in: °¨ Activity that involves moving your arms and legs. °¨ Activity that encourages good blood flow through your body by increasing your heart rate. °· Exercise your arms and legs every hour during long-distance travel (over 4 hours). Drink plenty of water and avoid drinking alcohol while traveling. °· Avoid sitting or lying in bed for long periods of time without moving your legs. °· Maintain a weight that is appropriate for your height. Ask your health care provider what weight is healthy for you. °· If you are a woman who is over 35 years of age, avoid unnecessary use of medicines that contain estrogen. These include birth control pills. °· Do not smoke, especially if you take estrogen medicines. If you need help quitting, ask your health care provider. °If you are hospitalized, prevention measures may include: °· Early walking after surgery, as soon as your health care provider says that it is safe. °· Receiving anticoagulants to prevent blood clots. If you cannot take  anticoagulants, other options may be available, such as wearing compression stockings or using different types of devices. °SEEK IMMEDIATE MEDICAL CARE IF: °· You have new or increased pain, swelling, or redness in an arm or leg. °· You have numbness or tingling in an arm or leg. °· You have shortness of breath while active or at rest. °· You have chest pain. °· You have a rapid or irregular heartbeat. °· You feel light-headed or dizzy. °· You cough up blood. °· You notice blood in your vomit, bowel movement, or urine. °These symptoms may represent a serious problem that is an emergency. Do not wait to see if the symptoms will go away. Get medical help right away. Call your local emergency services (911 in the [place] Do not drive yourself to the hospital. °  °  This information is not intended to replace advice given to you by your health care provider. Make sure you discuss any questions you have with your health care provider.   Document Released: 02/28/2005 Document Revised: 11/19/2014 Document Reviewed: 06/25/2014 Elsevier Interactive Patient Education 2016 [employer].   How and Where to Give Subcutaneous Enoxaparin Injections Enoxaparin is an injectable medicine. It is used to help prevent blood clots from developing in your veins. Health care providers often use anticoagulants like enoxaparin to prevent clots following surgery. Enoxaparin is also used in combination with other medicines to treat blood clots and heart attacks. If blood clots are left untreated, they can be life threatening.  Enoxaparin comes in single-use syringes. You inject enoxaparin through a syringe into your belly (abdomen). You should change the injection site each time you give yourself a shot. Continue the enoxaparin injections as directed by your health care provider. Your health care provider will use blood clotting test results to decide when you can safely stop using enoxaparin injections. If your health care provider  prescribes any additional medicines, use the medicines exactly as directed. HOW DO I INJECT ENOXAPARIN?  1. Wash your hands with soap and water. 2. Clean the selected injection site as directed by your health care provider. 3. Remove the needle cap by pulling it straight off the syringe. 4. Hold the syringe like a pencil using your writing hand. 5. Use your other hand to pinch and hold an inch of the cleansed skin. 6. Insert the entire needle straight down into the fold of skin. 7. Push the plunger with your thumb until the syringe is empty. 8. Pull the needle straight out of your skin. 9. Enoxaparin injection prefilled syringes and graduated prefilled syringes are available with a system that shields the needle after injection. After you have completed your injection and removed the needle from your skin, firmly push down on the plunger. The protective sleeve will automatically cover the needle and you will hear a click. The click means the needle is safely covered. 10. Place the syringe in the nearest needle box, also called a sharps container. If you do not have a sharps container, you can use a hard-sided plastic container with a secure lid, such as an empty laundry detergent bottle. WHAT ELSE DO I NEED TO KNOW?  Do not use enoxaparin if:  You have allergies to heparin or pork products.  You have been diagnosed with a condition called thrombocytopenia.  Do not use the syringe or needle more than one time.  Use medicines only as directed by your health care provider.  Changes in medicines, supplements, diet, and illness can affect your anticoagulation therapy. Be sure to inform your health care provider of any of these changes.  It is important that you tell all of your health care providers and your dentist that you are taking an anticoagulant, especially if you are injured or plan to have any type of procedure.  While on anticoagulants, you will need to have blood tests done  routinely as directed by your health care provider.  While using this medicine, avoid physical activities or sports that could result in a fall or cause injury.  Follow up with your laboratory test and health care provider appointments as directed. It is very important to keep your appointments. Not keeping appointments could result in a chronic or permanent injury, pain, or disability.  Before giving your medicine, you should make sure the injection is a clear and colorless or pale  yellow solution. If your medicine becomes discolored or if there are particles in the syringe, do not use it and notify your health care provider.  Keep your medicine safely stored at room temperature. SEEK MEDICAL CARE IF:  You develop any rashes on your skin.  You have large areas of bruising on your skin.  You have any worsening of the condition for which you take Enoxaparin.  You develop a fever. SEEK IMMEDIATE MEDICAL CARE IF:  You develop bleeding problems such as:  Bleeding from the gums or nose that does not stop quickly.  Vomiting blood or coughing up blood.  Blood in your urine.  Blood in your stool, or stool that has a dark, tarry, or coffee grounds appearance.  A cut that does not stop bleeding within 10 minutes. These symptoms may represent a serious problem that is an emergency. Do not wait to see if the symptoms will go away. Get medical help right away. Call your local emergency services (911 in the [place] Do not drive yourself to the hospital.    This information is not intended to replace advice given to you by your health care provider. Make sure you discuss any questions you have with your health care provider.   Document Released: 12/31/2003 Document Revised: 03/21/2014 Document Reviewed: 08/15/2013 Elsevier Interactive Patient Education [employer] Inc.

## 2015-10-18 NOTE — MAU Note (Signed)
Pt states she is nauseous and feels very weak. She states she was visiting someone in the hospital yesterday and she asked a doctor about a lump in her left arm and she states she was advised that she needed to be seen. Pt also states she had chest discomfort when she coughs. Pt denies cramping and bleeding. Pt states she just found out she was pregnant.

## 2015-10-18 NOTE — MAU Provider Note (Signed)
History     CSN: [id number]  Arrival date and time: 10/18/15 1526   First Provider Initiated Contact with Patient 10/18/15 1613      Chief Complaint  Patient presents with  . Cough  . Nausea  . Fatigue  . Mass   [id number] @10  wks c/o intermittent nausea and vomiting for several weeks. She has not used anything for it. She is tolerating fluids. She also c/o productive cough since yesterday. She describes mucous as thick and yellow. The coughing as well as laughing causes discomfort in her chest. She also reports swelling and tenderness of her left inner arm just above the elbow. She reports a sharp pain in the arm yesterday followed by swelling. She denies a palpable lump. She denies erythema at site. She has remote hx of PE and is not on a blood thinner.     OB History    Gravida Para Term Preterm AB Living   5 4 4  0 0 4   SAB TAB Ectopic Multiple Live Births   0 0 0 0 1      Past Medical History:  Diagnosis Date  . Abnormal Pap smear 2010   repeat was negative  . Anemia   . Anxiety   . Depression   . PE (pulmonary thromboembolism) (HCC)   . Sickle cell trait [facility](HCC)     Past Surgical History:  Procedure Laterality Date  . COLPOSCOPY    . FRACTURE SURGERY Right    3 surgeries    History reviewed. No pertinent family history.  Social History  Substance Use Topics  . Smoking status: Current Some Day Smoker    Types: Cigarettes  . Smokeless tobacco: Never Used  . Alcohol use Yes     Comment: once a week    Allergies:  Allergies  Allergen Reactions  . Penicillins Hives    Prescriptions Prior to Admission  Medication Sig Dispense Refill Last Dose  . Prenatal Vit-Fe Fumarate-FA (PRENATAL MULTIVITAMIN) TABS tablet Take 1 tablet by mouth daily.    10/17/2015 at Unknown time  . clindamycin (CLEOCIN) 150 MG capsule Take 3 capsules (450 mg total) by mouth 3 (three) times daily. (Patient not taking: Reported on 10/18/2015) 63 capsule 0 Not Taking at Unknown time  .  ibuprofen (ADVIL,MOTRIN) 800 MG tablet Take 1 tablet (800 mg total) by mouth 3 (three) times daily. (Patient not taking: Reported on 10/18/2015) 21 tablet 0 Not Taking at Unknown time  . naproxen (NAPROSYN) 500 MG tablet Take 1 tablet (500 mg total) by mouth 2 (two) times daily. (Patient not taking: Reported on 10/18/2015) 30 tablet 0 Not Taking at Unknown time    Review of Systems  Respiratory: Positive for cough and sputum production. Negative for hemoptysis and shortness of breath.   Cardiovascular: Negative for chest pain and leg swelling.  Gastrointestinal: Positive for nausea and vomiting.  Genitourinary: Negative.   Neurological: Negative.  Negative for tingling and sensory change.   Physical Exam   Blood pressure 124/75, pulse 86, temperature 97.7 F (36.5 C), temperature source Oral, resp. rate 20, last menstrual period 08/27/2015.  Physical Exam  Constitutional: She is oriented to person, place, and time. She appears well-developed and well-nourished.  HENT:  Head: Normocephalic and atraumatic.  Neck: Normal range of motion. Neck supple.  Cardiovascular: Normal rate.   Respiratory: Effort normal.  Musculoskeletal: Normal range of motion.       Arms: Neurological: She is alert and oriented to person, place, and time.  Skin:  Skin is warm and dry.  Psychiatric: She has a normal mood and affect.   Results for orders placed or performed during the hospital encounter of 10/18/15 (from the past 24 hour(s))  Urinalysis, Routine w reflex microscopic (not at [facility])     Status: Abnormal   Collection Time: 10/18/15  3:33 PM  Result Value Ref Range   Color, Urine YELLOW YELLOW   APPearance CLEAR CLEAR   Specific Gravity, Urine 1.020 1.005 - 1.030   pH 6.5 5.0 - 8.0   Glucose, UA NEGATIVE NEGATIVE mg/dL   Hgb urine dipstick TRACE (A) NEGATIVE   Bilirubin Urine NEGATIVE NEGATIVE   Ketones, ur NEGATIVE NEGATIVE mg/dL   Protein, ur NEGATIVE NEGATIVE mg/dL   Nitrite NEGATIVE NEGATIVE    Leukocytes, UA NEGATIVE NEGATIVE  Urine microscopic-add on     Status: Abnormal   Collection Time: 10/18/15  3:33 PM  Result Value Ref Range   Squamous Epithelial / LPF 6-30 (A) NONE SEEN   WBC, UA NONE SEEN 0 - 5 WBC/hpf   RBC / HPF 0-5 0 - 5 RBC/hpf   Bacteria, UA FEW (A) NONE SEEN   Urine-Other MUCOUS PRESENT     MAU Course  Procedures Bedside US: FHR 160s.  MDM Labs and venous doppler ordered and reviewed. Discussed findings with Dr. Despina Hidden. Plan for additional labs and Lovenox.  Dorathy Kinsman, CNM assumed care of pt at 2000.  - Discussed Dx DVT w/ pt. Pt taught Lovenox administration. Gave dose in MAU. No Sx of PE.  - N/V resolved w/ phenergan.  - Cough likely viral. Increase fluids, rest.     Donette Larry 10/18/2015, 4:28 PM   Assessment 1. DVT complicating pregnancy, first trimester   2. Viral upper respiratory tract infection with cough   3. Nausea and vomiting during pregnancy     Plan: D/C home DVT/PE/Bleeding precautions.  Follow-up Information    Center for [facility] .   Specialty:  Obstetrics and Gynecology Why:  Will call you to schedule appointment for prenatal care Contact information: [address] [place] [address] [phone]       [facility] [place] [facility]ERNITY ADMISSIONS .   Why:  As needed in emergencies Contact information: [address] [id number] [place] [address] [phone]           Medication List    STOP taking these medications   clindamycin 150 MG capsule Commonly known as:  CLEOCIN   ibuprofen 800 MG tablet Commonly known as:  ADVIL,MOTRIN   naproxen 500 MG tablet Commonly known as:  NAPROSYN     TAKE these medications   enoxaparin 100 MG/ML injection Commonly known as:  LOVENOX Inject 0.85 mLs (85 mg total) into the skin every 12 (twelve) hours.   prenatal multivitamin Tabs tablet Take 1 tablet by mouth daily.   promethazine 25  MG tablet Commonly known as:  PHENERGAN Take 1 tablet (25 mg total) by mouth every 6 (six) hours as needed for nausea or vomiting.      [place], CNM 10/19/2015 12:21 AM

## 2015-10-20 LAB — PROTEIN S, TOTAL: PROTEIN S AG TOTAL: 106 % (ref 60–150)

## 2015-10-20 LAB — PROTEIN C, TOTAL: PROTEIN C, TOTAL: 95 % (ref 60–150)

## 2015-10-22 LAB — FACTOR 5 LEIDEN

## 2015-10-23 LAB — PROTHROMBIN GENE MUTATION

## 2015-10-27 LAB — ANTIPHOSPHOLIPID SYNDROME EVAL, BLD
Anticardiolipin IgA: <9 APL U/mL (ref 0–11)
DRVVT: 38.8 s (ref 0.0–47.0)
PHOSPHATYDALSERINE, IGG: 2 {GPS'U} (ref 0–11)
PHOSPHATYDALSERINE, IGM: 7 {MPS'U} (ref 0–25)
PTT LA: 37.2 s (ref 0.0–51.9)
Phosphatydalserine, IgA: 1 APS IgA (ref 0–20)

## 2015-10-29 ENCOUNTER — Other Ambulatory Visit (HOSPITAL_COMMUNITY)
Admission: RE | Admit: 2015-10-29 | Discharge: 2015-10-29 | Disposition: A | Discharge status: Home / Self Care | Payer: Medicaid Other | Source: Ambulatory Visit | Attending: Family | Admitting: Family

## 2015-10-29 ENCOUNTER — Telehealth: Payer: Self-pay

## 2015-10-29 ENCOUNTER — Ambulatory Visit (INDEPENDENT_AMBULATORY_CARE_PROVIDER_SITE_OTHER): Payer: Medicaid Other | Admitting: Family

## 2015-10-29 ENCOUNTER — Ambulatory Visit (HOSPITAL_COMMUNITY): Payer: Medicaid Other

## 2015-10-29 ENCOUNTER — Encounter: Payer: Self-pay | Admitting: Family

## 2015-10-29 VITALS — BP 117/68 | HR 72 | Wt 195.4 lb

## 2015-10-29 DIAGNOSIS — O88219 Thromboembolism in pregnancy, unspecified trimester: Secondary | ICD-10-CM | POA: Insufficient documentation

## 2015-10-29 DIAGNOSIS — O99011 Anemia complicating pregnancy, first trimester: Secondary | ICD-10-CM

## 2015-10-29 DIAGNOSIS — Z01419 Encounter for gynecological examination (general) (routine) without abnormal findings: Secondary | ICD-10-CM | POA: Diagnosis present

## 2015-10-29 DIAGNOSIS — Z113 Encounter for screening for infections with a predominantly sexual mode of transmission: Secondary | ICD-10-CM | POA: Diagnosis present

## 2015-10-29 DIAGNOSIS — Z7901 Long term (current) use of anticoagulants: Secondary | ICD-10-CM | POA: Diagnosis not present

## 2015-10-29 DIAGNOSIS — O0990 Supervision of high risk pregnancy, unspecified, unspecified trimester: Secondary | ICD-10-CM | POA: Insufficient documentation

## 2015-10-29 DIAGNOSIS — I82A29 Chronic embolism and thrombosis of unspecified axillary vein: Secondary | ICD-10-CM

## 2015-10-29 DIAGNOSIS — I82629 Acute embolism and thrombosis of deep veins of unspecified upper extremity: Secondary | ICD-10-CM | POA: Insufficient documentation

## 2015-10-29 DIAGNOSIS — Z124 Encounter for screening for malignant neoplasm of cervix: Secondary | ICD-10-CM

## 2015-10-29 DIAGNOSIS — Z86718 Personal history of other venous thrombosis and embolism: Secondary | ICD-10-CM

## 2015-10-29 DIAGNOSIS — D573 Sickle-cell trait: Secondary | ICD-10-CM | POA: Diagnosis not present

## 2015-10-29 DIAGNOSIS — Z86711 Personal history of pulmonary embolism: Secondary | ICD-10-CM

## 2015-10-29 DIAGNOSIS — Z1151 Encounter for screening for human papillomavirus (HPV): Secondary | ICD-10-CM | POA: Diagnosis not present

## 2015-10-29 DIAGNOSIS — O0991 Supervision of high risk pregnancy, unspecified, first trimester: Secondary | ICD-10-CM

## 2015-10-29 LAB — POCT URINALYSIS DIP (DEVICE)
Bilirubin Urine: NEGATIVE
GLUCOSE, UA: NEGATIVE mg/dL
Hgb urine dipstick: NEGATIVE
Ketones, ur: NEGATIVE mg/dL
Leukocytes, UA: NEGATIVE
NITRITE: NEGATIVE
PH: 7 (ref 5.0–8.0)
PROTEIN: 30 mg/dL — AB
Specific Gravity, Urine: 1.02 (ref 1.005–1.030)
UROBILINOGEN UA: 0.2 mg/dL (ref 0.0–1.0)

## 2015-10-29 NOTE — Progress Notes (Signed)
1 hour gtt

## 2015-10-29 NOTE — Progress Notes (Signed)
First Trimester Screen scheduled for August 29th @ 1430.   CT scheduled @ [facility] for August 24th @ 0900. Pt informed to be NPO 4 hours prior to appt.   Pt notified.

## 2015-10-29 NOTE — Telephone Encounter (Signed)
Called pt x 2 and LM that were calling in regards to the provider wanting to have her [facility] appt scheduled for today.  It has been scheduled for 1730 today awaiting pt to call us stating that she has gotten her appt.

## 2015-10-29 NOTE — Progress Notes (Signed)
Subjective:  Nancy StanfordDominique Francis is a 35 y.o. G5P4004 at 1862w1d being seen today for initial OB visit.  She is currently monitored for the following issues for this high-risk pregnancy and has Sickle cell trait (HCC); Bilateral pulmonary embolism (HCC); Acute dyspnea; Chest pain; SVD (spontaneous vaginal delivery) 08/14; Supervision of high risk pregnancy, antepartum; Hx Pulmonary embolism, currently pregnant, antepartum; and DVT of upper extremity (deep vein thrombosis) (HCC) on her problem list.   Medical history includes 4 term NSVD without complications.  Bilateral pulmonary embolism developed 2 months after last baby.  Recently diagnosed with left upper extremity in MAU and placed on Lovenox 85 mg BID.   Patient reports intermittent chest pain accompanied with SOB, last incidence last night.  Reports no pain at this time..  Contractions: Not present. Vag. Bleeding: None.  Movement: Absent. Denies leaking of fluid.   The following portions of the patient's history were reviewed and updated as appropriate: allergies, current medications, past family history, past medical history, past social history, past surgical history and problem list. Problem list updated.  Objective:   Vitals:   10/29/15 1310  BP: 117/68  Pulse: 72  Weight: 195 lb 6.4 oz (88.6 kg)    Fetal Status:     Movement: Absent     General:  Alert, oriented and cooperative. Patient is in no acute distress.  Skin: Skin is warm and dry. No rash noted.   Cardiovascular: Normal heart rate noted  Respiratory: Normal respiratory effort, no problems with respiration noted; lungs clear throughout  Abdomen: Soft, gravid, appropriate for gestational age. Pain/Pressure: Absent     Pelvic:  Cervical exam deferred        Extremities: Normal range of motion.  Edema: None  Mental Status: Normal mood and affect. Normal behavior. Normal judgment and thought content.   Urinalysis:    Protein 1+ Glucose negative  Assessment and Plan:   Pregnancy: G5P4004 at 5362w1d  1. Sickle cell trait (HCC) - FOB negative  2. Supervision of high risk pregnancy, antepartum, first trimester - Prenatal Profile - Culture, OB Urine - Pain Mgmt, Profile 6 Conf w/o mM, U - Hemoglobinopathy Evaluation - Glucose Tolerance, 1 HR (50g) - GC[facility]Chlamydia probe amp ([place] - First screen ordered  3. Hx Pulmonary embolism, currently pregnant, antepartum - CT scan with angio today  4. 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 Please refer to After Visit Summary for other counseling recommendations.  Return in about 2 weeks (around 11/12/2015).   Eino FarberWalidah Kennith GainN Karim, CNM

## 2015-10-30 ENCOUNTER — Ambulatory Visit (HOSPITAL_COMMUNITY)
Admission: RE | Admit: 2015-10-30 | Discharge: 2015-10-30 | Disposition: A | Discharge status: Home / Self Care | Payer: Medicaid Other | Source: Ambulatory Visit | Attending: Family | Admitting: Family

## 2015-10-30 ENCOUNTER — Encounter (HOSPITAL_COMMUNITY): Payer: Self-pay

## 2015-10-30 DIAGNOSIS — O88819 Other embolism in pregnancy, unspecified trimester: Secondary | ICD-10-CM | POA: Insufficient documentation

## 2015-10-30 DIAGNOSIS — O88219 Thromboembolism in pregnancy, unspecified trimester: Secondary | ICD-10-CM

## 2015-10-30 LAB — PAIN MGMT, PROFILE 6 CONF W/O MM, U
6 ACETYLMORPHINE: NEGATIVE ng/mL (ref <10)
AMPHETAMINES: NEGATIVE ng/mL (ref <500)
Alcohol Metabolites: NEGATIVE ng/mL (ref <500)
Barbiturates: NEGATIVE ng/mL (ref <300)
Benzodiazepines: NEGATIVE ng/mL (ref <100)
Cocaine Metabolite: NEGATIVE ng/mL (ref <150)
Creatinine: 267.8 mg/dL (ref >=20.0)
Marijuana Metabolite: NEGATIVE ng/mL (ref <20)
Methadone Metabolite: NEGATIVE ng/mL (ref <100)
OPIATES: NEGATIVE ng/mL (ref <100)
OXIDANT: NEGATIVE ug/mL (ref <200)
Oxycodone: NEGATIVE ng/mL (ref <100)
PH: 7.3 (ref 4.5–9.0)
PLEASE NOTE: 0
Phencyclidine: NEGATIVE ng/mL (ref <25)

## 2015-10-30 LAB — WET PREP, GENITAL
TRICH WET PREP: NONE SEEN
Yeast Wet Prep HPF POC: NONE SEEN

## 2015-10-30 LAB — CYTOLOGY - PAP

## 2015-10-30 LAB — GC/CHLAMYDIA PROBE AMP (~~LOC~~) NOT AT ARMC
CHLAMYDIA, DNA PROBE: NEGATIVE
NEISSERIA GONORRHEA: NEGATIVE

## 2015-10-30 LAB — GLUCOSE TOLERANCE, 1 HOUR (50G) W/O FASTING: Glucose, 1 Hr, gestational: 68 mg/dL (ref <140)

## 2015-10-30 MED ORDER — IOPAMIDOL (ISOVUE-370) INJECTION 76%
INTRAVENOUS | Status: AC
  Administered 2015-10-30: 100 mL
  Filled 2015-10-30: qty 100

## 2015-10-30 NOTE — Telephone Encounter (Signed)
Pt returned call and pt informed me that she has been without food or drink this am.  CT scheduled for today @ 1500 @ [place].  Pt agreed.

## 2015-10-31 LAB — PRENATAL PROFILE (SOLSTAS)
ANTIBODY SCREEN: NEGATIVE
BASOS PCT: 0 %
Basophils Absolute: 0 cells/uL (ref 0–200)
Eosinophils Absolute: 120 cells/uL (ref 15–500)
Eosinophils Relative: 2 %
HEMATOCRIT: 32.8 % — AB (ref 35.0–45.0)
HIV: NONREACTIVE
Hemoglobin: 10.9 g/dL — ABNORMAL LOW (ref 11.7–15.5)
Hepatitis B Surface Ag: NEGATIVE
LYMPHS PCT: 25 %
Lymphs Abs: 1500 cells/uL (ref 850–3900)
MCH: 29.8 pg (ref 27.0–33.0)
MCHC: 33.2 g/dL (ref 32.0–36.0)
MCV: 89.6 fL (ref 80.0–100.0)
MONOS PCT: 11 %
MPV: 9.4 fL (ref 7.5–12.5)
Monocytes Absolute: 660 cells/uL (ref 200–950)
NEUTROS PCT: 62 %
Neutro Abs: 3720 cells/uL (ref 1500–7800)
PLATELETS: 293 10*3/uL (ref 140–400)
RBC: 3.66 MIL/uL — AB (ref 3.80–5.10)
RDW: 15.1 % — AB (ref 11.0–15.0)
RH TYPE: POSITIVE
Rubella: 2.43 Index — ABNORMAL HIGH (ref <0.90)
WBC: 6 10*3/uL (ref 3.8–10.8)

## 2015-11-01 LAB — CULTURE, OB URINE: Colony Count: >=100000

## 2015-11-02 LAB — HEMOGLOBINOPATHY EVALUATION
HCT: 32.8 % — ABNORMAL LOW (ref 35.0–45.0)
HGB A2 QUANT: 2.5 % (ref 1.8–3.5)
HGB A: 96.5 % (ref >96.0)
Hemoglobin: 10.9 g/dL — ABNORMAL LOW (ref 11.7–15.5)
MCH: 29.8 pg (ref 27.0–33.0)
MCV: 89.6 fL (ref 80.0–100.0)
RBC: 3.66 MIL/uL — ABNORMAL LOW (ref 3.80–5.10)
RDW: 15.1 % — ABNORMAL HIGH (ref 11.0–15.0)

## 2015-11-03 ENCOUNTER — Encounter (HOSPITAL_COMMUNITY): Payer: Self-pay | Admitting: Family

## 2015-11-05 ENCOUNTER — Ambulatory Visit (HOSPITAL_COMMUNITY): Payer: Medicaid Other

## 2015-11-05 ENCOUNTER — Telehealth: Payer: Self-pay | Admitting: Family

## 2015-11-05 DIAGNOSIS — B379 Candidiasis, unspecified: Secondary | ICD-10-CM

## 2015-11-05 MED ORDER — TERCONAZOLE 0.4 % VA CREA: 1.0000 | TOPICAL_CREAM | Freq: Every day | VAGINAL | 0 refills | Status: DC

## 2015-11-05 NOTE — Telephone Encounter (Signed)
Left message regarding yeast infection and RX sent to pharmacy for Terazol.

## 2015-11-10 ENCOUNTER — Ambulatory Visit (HOSPITAL_COMMUNITY)
Admission: RE | Admit: 2015-11-10 | Discharge: 2015-11-10 | Disposition: A | Discharge status: Home / Self Care | Payer: Medicaid Other | Source: Ambulatory Visit | Attending: Family | Admitting: Family

## 2015-11-10 ENCOUNTER — Other Ambulatory Visit: Payer: Self-pay | Admitting: Family

## 2015-11-10 ENCOUNTER — Encounter (HOSPITAL_COMMUNITY): Payer: Self-pay

## 2015-11-10 DIAGNOSIS — Z3A13 13 weeks gestation of pregnancy: Secondary | ICD-10-CM

## 2015-11-10 DIAGNOSIS — O0991 Supervision of high risk pregnancy, unspecified, first trimester: Secondary | ICD-10-CM

## 2015-11-10 DIAGNOSIS — Z369 Encounter for antenatal screening, unspecified: Secondary | ICD-10-CM

## 2015-11-10 DIAGNOSIS — Z36 Encounter for antenatal screening of mother: Secondary | ICD-10-CM | POA: Diagnosis not present

## 2015-11-10 DIAGNOSIS — O88219 Thromboembolism in pregnancy, unspecified trimester: Secondary | ICD-10-CM

## 2015-11-19 ENCOUNTER — Other Ambulatory Visit (HOSPITAL_COMMUNITY): Payer: Self-pay

## 2015-11-20 ENCOUNTER — Ambulatory Visit (INDEPENDENT_AMBULATORY_CARE_PROVIDER_SITE_OTHER): Payer: Medicaid Other | Admitting: Family Medicine

## 2015-11-20 VITALS — BP 125/77 | HR 92 | Wt 192.9 lb

## 2015-11-20 DIAGNOSIS — O289 Unspecified abnormal findings on antenatal screening of mother: Secondary | ICD-10-CM

## 2015-11-20 DIAGNOSIS — O0992 Supervision of high risk pregnancy, unspecified, second trimester: Secondary | ICD-10-CM | POA: Diagnosis present

## 2015-11-20 DIAGNOSIS — O2231 Deep phlebothrombosis in pregnancy, first trimester: Secondary | ICD-10-CM | POA: Diagnosis not present

## 2015-11-20 LAB — POCT URINALYSIS DIP (DEVICE)
GLUCOSE, UA: NEGATIVE mg/dL
KETONES UR: NEGATIVE mg/dL
Nitrite: NEGATIVE
PROTEIN: 30 mg/dL — AB
Specific Gravity, Urine: >=1.03 (ref 1.005–1.030)
Urobilinogen, UA: 0.2 mg/dL (ref 0.0–1.0)
pH: 6 (ref 5.0–8.0)

## 2015-11-20 MED ORDER — SERTRALINE HCL 50 MG PO TABS: 50.0000 mg | ORAL_TABLET | Freq: Every day | ORAL | 3 refills | Status: DC

## 2015-11-20 MED ORDER — ENOXAPARIN SODIUM 100 MG/ML ~~LOC~~ SOLN: 1.0000 mg/kg | Freq: Two times a day (BID) | SUBCUTANEOUS | 6 refills | Status: DC

## 2015-11-20 MED ORDER — ENOXAPARIN SODIUM 100 MG/ML ~~LOC~~ SOLN: 1.0000 mg/kg | Freq: Two times a day (BID) | SUBCUTANEOUS | 4 refills | Status: DC

## 2015-11-20 NOTE — Progress Notes (Signed)
   PRENATAL VISIT NOTE  Subjective:  Eileen StanfordDominique Sesma is a 35 y.o. G5P4004 at 6179w2d being seen today for ongoing prenatal care.  She is currently monitored for the following issues for this high-risk pregnancy and has Sickle cell trait (HCC); Bilateral pulmonary embolism (HCC); Acute dyspnea; Chest pain; SVD (spontaneous vaginal delivery) 08/14; Supervision of high risk pregnancy, antepartum; Hx Pulmonary embolism, currently pregnant, antepartum; and DVT of upper extremity (deep vein thrombosis) (HCC) on her problem list.  Patient reports depression - positive screen.  Has been on Zoloft in the past.  Has anxiety features..  Contractions: Not present. Vag. Bleeding: None.  Movement: Absent. Denies leaking of fluid.   The following portions of the patient's history were reviewed and updated as appropriate: allergies, current medications, past family history, past medical history, past social history, past surgical history and problem list. Problem list updated.  Objective:   Vitals:   11/20/15 1041  BP: 125/77  Pulse: 92  Weight: 192 lb 14.4 oz (87.5 kg)    Fetal Status: Fetal Heart Rate (bpm): 160   Movement: Absent     General:  Alert, oriented and cooperative. Patient is in no acute distress.  Skin: Skin is warm and dry. No rash noted.   Cardiovascular: Normal heart rate noted  Respiratory: Normal respiratory effort, no problems with respiration noted  Abdomen: Soft, gravid, appropriate for gestational age. Pain/Pressure: Present     Pelvic:  Cervical exam deferred        Extremities: Normal range of motion.  Edema: Trace  Mental Status: Normal mood and affect. Normal behavior. Normal judgment and thought content.   Urinalysis: Urine Protein: 1+ Urine Glucose: Negative  Assessment and Plan:  Pregnancy: G5P4004 at 5879w2d  1. Supervision of high risk pregnancy, antepartum, second trimester FHT normal - US MFM OB COMP + 14 WK; Future  2. DVT complicating pregnancy, first  trimester Continue lovenox - enoxaparin (LOVENOX) 100 MG/ML injection; Inject 0.85 mLs (85 mg total) into the skin every 12 (twelve) hours.  Dispense: 60 Syringe; Refill: 4 - enoxaparin (LOVENOX) 100 MG/ML injection; Inject 0.85 mLs (85 mg total) into the skin every 12 (twelve) hours.  Dispense: 50 Syringe; Refill: 6  3. Abnormal first trimester screen Elevated risk for T13/18.  Discussed results.  Refer to Genetic counseling. - AMB MFM GENETICS REFERRAL  Preterm labor symptoms and general obstetric precautions including but not limited to vaginal bleeding, contractions, leaking of fluid and fetal movement were reviewed in detail with the patient. Please refer to After Visit Summary for other counseling recommendations.  Return in about 4 weeks (around 12/18/2015).  Levie HeritageJacob J Wainwright, DO

## 2015-11-20 NOTE — Progress Notes (Signed)
Declines flu vaccine today.

## 2015-11-23 ENCOUNTER — Ambulatory Visit (HOSPITAL_COMMUNITY)
Admission: RE | Admit: 2015-11-23 | Discharge: 2015-11-23 | Disposition: A | Discharge status: Home / Self Care | Payer: Medicaid Other | Source: Ambulatory Visit | Attending: Family Medicine | Admitting: Family Medicine

## 2015-11-23 DIAGNOSIS — O283 Abnormal ultrasonic finding on antenatal screening of mother: Secondary | ICD-10-CM | POA: Insufficient documentation

## 2015-11-23 DIAGNOSIS — Z3A15 15 weeks gestation of pregnancy: Secondary | ICD-10-CM | POA: Insufficient documentation

## 2015-11-23 DIAGNOSIS — O280 Abnormal hematological finding on antenatal screening of mother: Secondary | ICD-10-CM | POA: Insufficient documentation

## 2015-11-23 DIAGNOSIS — Z315 Encounter for genetic counseling: Secondary | ICD-10-CM | POA: Diagnosis present

## 2015-11-23 NOTE — Progress Notes (Signed)
Genetic Counseling  High-Risk Gestation Note  Appointment Date:  11/23/2015 Referred By: [name], DO Date of Birth:  1980/07/25   Pregnancy History: [id number] Estimated Date of Delivery: 05/11/16 Estimated Gestational Age: [redacted]w[redacted]d Attending: [name], MD   Ms. Nancy Francis was seen for genetic counseling because of an increased risk for fetal trisomy 18/13 based on first trimester screening.  In summary:  Reviewed results of First screen   1:20 risk from Trisomy 18/13  Low PAPP-A- reviewed associated increased risk for adverse pregnancy outcomes  Discussed additional screening options  NIPS-elected to pursue today (Panorama)  Ultrasound-scheduled 12/14/15  Discussed diagnostic testing options  Amniocentesis-declined  Reviewed family history concerns  Father of pregnancy with postaxial polydactyly, apparently isolated- Reviewed likely autosomal dominant inheritance of isolated polydactyly and 1 in 2 (50%) recurrence risk for pregnancy  Patient's paternal uncle with sickle cell anemia; Patient had hemoglobin electrophoresis within normal range (Hemoglobin AA)  She was counseled regarding the screening result and the associated 1 in 20 risk for fetal trisomy 18/13.  We reviewed chromosomes, nondisjunction, and the common features and poor prognosis of trisomy 1 and trisomy 17.  In addition, we reviewed the screen adjusted reduction in risks for Down syndrome (1 in 823 to 1 in >10,000).  We also discussed other explanations for a screen positive result including: differences in maternal metabolism and normal variation. We specifically discussed that the level of one of the proteins analyzed on the screen, PAPP-A, was very low (0.22 MoM).  This has been associated with an increased risk for growth restriction or poor pregnancy outcome later in pregnancy; therefore, we would recommend a follow up ultrasound for fetal growth in the third trimester.  We reviewed other  available screening options including noninvasive prenatal screening (NIPS)/cell free DNA (cfDNA) screening and detailed ultrasound.  She was counseled that screening tests are used to modify a patient's a priori risk for aneuploidy, typically based on age. This estimate provides a pregnancy specific risk assessment. We reviewed the benefits and limitations of each option. Specifically, we discussed the conditions for which each test screens, the detection rates, and false positive rates of each. She was also counseled regarding diagnostic testing via amniocentesis. We reviewed the approximate 1 in 300-500 risk for complications from amniocentesis, including spontaneous pregnancy loss. We discussed the possible results that the tests might provide including: positive, negative, unanticipated, and no result. Finally, she was counseled regarding the cost of each option and potential out of pocket expenses. After consideration of all the options, she elected to proceed with NIPS (Panorama through [facility] laboratory).  Those results will be available in 8-10 days.  She declined amniocentesis.   [facility]. She understands that screening tests cannot rule out all birth defects or genetic syndromes. The patient was advised of this limitation.   Both family histories were reviewed and found to be contributory for apparently isolated postaxial polydactyly for the father of the pregnancy. Polydactyly is typically an isolated trait that can occur sporadically in a person or inherited as a familial trait.  When inherited as an isolated trait, autosomal dominant inheritance is observed, meaning each pregnancy of a parent with polydactyly has a 50% (1 in 2) chance to inherit this genetic predisposition for polydactyly.  Not all individuals that inherit this predisposition would be born with polydactyly, but they would still be at increased risk to have  affected children.  Less commonly, polydactyly may  be one feature of an underlying genetic condition that may or may not be inherited. Given the reported family history of apparently isolated polydactyly for the father of the pregnancy, recurrence risk for each of his offspring is up to approximately 1 in 2 (50%).  However, if it were due to a different underlying cause, recurrence risk estimate may change. Targeted ultrasound is available to assess for polydactyly in the pregnancy. The patient understands that ultrasound cannot rule out all birth defects prenatally.  The father of the pregnancy's maternal half-sister had a daughter with brain damage and required a feeding tube. She died at age 412 months, and limited information was known regarding the underlying etiology. We discussed that the medical issues could be related to a genetic, sporadic, environmental, or multifactorial etiology. Given the degree of relation, recurrence risk is likely low. However, additional information is needed regarding the etiology to accurately assess recurrence risk and to assess potential available screening or testing options.   Nancy Francis reported that her paternal uncle has sickle cell anemia. She reported that she was told in childhood that she has sickle cell trait. However, review of the patient's OB medical records indicate a normal hemoglobin electrophoresis, showing normal adult hemoglobin (Hemoglobin AA). Thus, her risk to have sickle cell trait or other hemoglobin variant has been significantly reduced. We reviewed the autosomal recessive inheritance of sickle cell anemia and other hemoglobinopathies. Without further information regarding the provided family history, an accurate genetic risk cannot be calculated. Further genetic counseling is warranted if more information is obtained.  Nancy Francis denied exposure to environmental toxins or chemical agents. She denied the use of street drugs. She  reported smoking 1-2 cigarettes per day.The associations of smoking in pregnancy were reviewed and cessation encouraged. She reported drinking alcohol prior to being aware of the pregnancy and reported no alcohol use since approximately [redacted] weeks gestation. The all-or-none period was discussed, meaning exposures that occur in the first 4 weeks of gestation are typically thought to either not affect the pregnancy at all or result in a miscarriage.  She denied significant viral illnesses during the course of her pregnancy. Her medical and surgical histories were contributory for history of pulmonary embolism, and she is currently treated with Lovenox.   I counseled Ms. Eileen Stanfordominique Rosetti for approximately 45 minutes regarding the above risks and available options.   Quinn PlowmanKaren Shonteria Abeln, MS,  Certified Genetic Counselor 11/23/2015

## 2015-11-27 ENCOUNTER — Telehealth (HOSPITAL_COMMUNITY): Payer: Self-pay | Admitting: Genetics

## 2015-11-27 NOTE — Telephone Encounter (Signed)
Called Ms. Behunin regarding her cell free DNA screening results (NIPS).  There was no answer and her voicemail was not personalized so I did not leave a message.

## 2015-11-30 ENCOUNTER — Other Ambulatory Visit (HOSPITAL_COMMUNITY): Payer: Self-pay

## 2015-11-30 ENCOUNTER — Telehealth (HOSPITAL_COMMUNITY): Payer: Self-pay | Admitting: MS"

## 2015-11-30 NOTE — Telephone Encounter (Signed)
Attempted to call patient regarding results of Panorama (noninvasive prenatal screening), which are within normal limits. Left message for patient to return call.   Clydie BraunKaren Eldred Sooy 11/30/2015 10:01 AM

## 2015-11-30 NOTE — Telephone Encounter (Signed)
Called Nancy StanfordDominique Francis to discuss her prenatal cell free DNA test results.  Mrs. Nancy Francis had Panorama testing through [facility].  Testing was offered because of abnormal maternal serum screen.   The patient was identified by name and DOB.  We reviewed that these are within normal limits, showing a less than 1 in 10,000 risk for trisomies 21, 18 and 13, and monosomy X (Turner syndrome).  In addition, the risk for triploidy/vanishing twin and sex chromosome trisomies (47,XXX and 47,XXY) was also low risk.  We reviewed that this testing identifies > 99% of pregnancies with trisomy 5921, trisomy 2613, sex chromosome trisomies (47,XXX and 47,XXY), and triploidy. The detection rate for trisomy 18 is 96%.  The detection rate for monosomy X is ~92%.  The false positive rate is <0.1% for all conditions. Testing was also consistent with female fetal sex.  The patient did wish to know fetal sex.  She understands that this testing does not identify all genetic conditions.  All questions were answered to her satisfaction, she was encouraged to call with additional questions or concerns.  Quinn PlowmanKaren Vernon Ariel, MS Patent attorneyCertified Genetic Counselor

## 2015-12-14 ENCOUNTER — Encounter (HOSPITAL_COMMUNITY): Payer: Self-pay

## 2015-12-14 ENCOUNTER — Ambulatory Visit (HOSPITAL_COMMUNITY)
Admission: RE | Admit: 2015-12-14 | Discharge: 2015-12-14 | Disposition: A | Discharge status: Home / Self Care | Payer: Medicaid Other | Source: Ambulatory Visit | Attending: Family Medicine | Admitting: Family Medicine

## 2015-12-14 ENCOUNTER — Other Ambulatory Visit: Payer: Self-pay | Admitting: Family Medicine

## 2015-12-14 DIAGNOSIS — O88212 Thromboembolism in pregnancy, second trimester: Secondary | ICD-10-CM | POA: Insufficient documentation

## 2015-12-14 DIAGNOSIS — O0992 Supervision of high risk pregnancy, unspecified, second trimester: Secondary | ICD-10-CM

## 2015-12-14 DIAGNOSIS — Z3A18 18 weeks gestation of pregnancy: Secondary | ICD-10-CM | POA: Diagnosis not present

## 2015-12-14 DIAGNOSIS — Z363 Encounter for antenatal screening for malformations: Secondary | ICD-10-CM | POA: Insufficient documentation

## 2015-12-14 DIAGNOSIS — O2232 Deep phlebothrombosis in pregnancy, second trimester: Secondary | ICD-10-CM | POA: Insufficient documentation

## 2015-12-15 ENCOUNTER — Other Ambulatory Visit (HOSPITAL_COMMUNITY): Payer: Self-pay | Admitting: *Deleted

## 2015-12-15 DIAGNOSIS — IMO0002 Reserved for concepts with insufficient information to code with codable children: Secondary | ICD-10-CM

## 2015-12-15 DIAGNOSIS — Z0489 Encounter for examination and observation for other specified reasons: Secondary | ICD-10-CM

## 2015-12-17 ENCOUNTER — Encounter: Payer: Medicaid Other | Admitting: Obstetrics and Gynecology

## 2015-12-22 ENCOUNTER — Ambulatory Visit (INDEPENDENT_AMBULATORY_CARE_PROVIDER_SITE_OTHER): Payer: Self-pay | Admitting: Clinical

## 2015-12-22 ENCOUNTER — Ambulatory Visit (INDEPENDENT_AMBULATORY_CARE_PROVIDER_SITE_OTHER): Payer: Medicaid Other | Admitting: Advanced Practice Midwife

## 2015-12-22 VITALS — BP 107/68 | HR 81 | Wt 189.0 lb

## 2015-12-22 DIAGNOSIS — F4323 Adjustment disorder with mixed anxiety and depressed mood: Secondary | ICD-10-CM

## 2015-12-22 DIAGNOSIS — O0992 Supervision of high risk pregnancy, unspecified, second trimester: Secondary | ICD-10-CM

## 2015-12-22 DIAGNOSIS — O88212 Thromboembolism in pregnancy, second trimester: Secondary | ICD-10-CM

## 2015-12-22 DIAGNOSIS — O280 Abnormal hematological finding on antenatal screening of mother: Secondary | ICD-10-CM

## 2015-12-22 DIAGNOSIS — O0990 Supervision of high risk pregnancy, unspecified, unspecified trimester: Secondary | ICD-10-CM

## 2015-12-22 DIAGNOSIS — I82A29 Chronic embolism and thrombosis of unspecified axillary vein: Secondary | ICD-10-CM

## 2015-12-22 DIAGNOSIS — O88219 Thromboembolism in pregnancy, unspecified trimester: Secondary | ICD-10-CM

## 2015-12-22 NOTE — Patient Instructions (Signed)

## 2015-12-22 NOTE — BH Specialist Note (Signed)
Session Start time: 2:45   End Time: 3:03pm Total Time:  18 minutes Type of Service: Behavioral Health - Individual/Family Interpreter: No.   Interpreter Name & Language: n/a # [facility]   SUBJECTIVE: Nancy Francis is a 35 y.o. female  Pt. was referred by Nancy Francis, CNM for:  anxiety and depression. Pt. reports the following symptoms/concerns: Pt states that she feels the Zoloft is working to make her symptoms of depression and anxiety manageable, that she has been treated "for years"; occasional panic attack. [facility] and is open to come back for postpartum planning in 3rd trimester.  Duration of problem:  Increase current pregnancy Severity: mild Previous treatment: currently being treated by OB   OBJECTIVE: Mood: Appropriate & Affect: Appropriate Risk of harm to self or others: No known risk of harm to self or others Assessments administered: PHQ9: 10/ GAD7: 5  LIFE CONTEXT:  Family & Social: 4 children from 2-14  Life changes: Current pregnancy   GOALS ADDRESSED:  -Alleviate symptoms of depression and anxiety  INTERVENTIONS: Motivational Interviewing   ASSESSMENT:  Pt currently experiencing Adjustment disorder with mixed anxious and depressed mood.  Pt may benefit from psychoeducation and brief therapeutic intervention regarding coping with symptoms of anxiety and depression.      PLAN: 1. F/U with behavioral health clinician: 4 months, or as needed, for postpartum planning, or earlier, as needed, if symptoms increase  2. Behavioral Health meds: Zoloft 3. Behavioral recommendations:  -Read educational material regarding coping with symptoms of depression and anxiety(with panic attacks) 4. Referral: Brief Counseling/Psychotherapy and Psychoeducation  Depression screen [facility] 2[facility] 12/22/2015 11/20/2015  Decreased Interest 2 1  Down, Depressed, Hopeless 1 3  PHQ - 2 Score 3 4  Altered  sleeping 2 1  Tired, decreased energy 2 1  Change in appetite 1 1  Feeling bad or failure about yourself  1 1  Trouble concentrating 1 1  Moving slowly or fidgety/restless 0 0  Suicidal thoughts 0 0  PHQ-9 Score 10 9   GAD 7 : Generalized Anxiety Score 12/22/2015 11/20/2015 10/29/2015  Nervous, Anxious, on Edge 0 3 0  Control/stop worrying 1 3 1   Worry too much - different things 1 3 1   Trouble relaxing 1 3 1   Restless 0 1 0  Easily annoyed or irritable 2 3 1   Afraid - awful might happen 0 1 0  Total GAD 7 Score 5 17 4        Rae LipsJamie C Aleksandr Pellow, LCSWA Behavioral Health Clinician  Marlon PelWarmhandoff:   Warm Hand Off Completed.

## 2015-12-22 NOTE — Progress Notes (Signed)
   PRENATAL VISIT NOTE  Subjective:  Nancy Francis is a 35 y.o. G5P4004 at 5154w6d being seen today for ongoing prenatal care.  She is currently monitored for the following issues for this high-risk pregnancy and has Sickle cell trait (HCC); Bilateral pulmonary embolism (HCC); Acute dyspnea; Chest pain; SVD (spontaneous vaginal delivery) 08/14; Supervision of high risk pregnancy, antepartum; Hx Pulmonary embolism, currently pregnant, antepartum; DVT of upper extremity (deep vein thrombosis) (HCC); and Abnormal maternal serum screening test on her problem list.  Patient reports no complaints.  Contractions: Not present. Vag. Bleeding: None.  Movement: Present. Denies leaking of fluid.   The following portions of the patient's history were reviewed and updated as appropriate: allergies, current medications, past family history, past medical history, past social history, past surgical history and problem list. Problem list updated.  Objective:   Vitals:   12/22/15 1353  BP: 107/68  Pulse: 81  Weight: 189 lb (85.7 kg)    Fetal Status: Fetal Heart Rate (bpm): 158   Movement: Present     General:  Alert, oriented and cooperative. Patient is in no acute distress.  Skin: Skin is warm and dry. No rash noted.   Cardiovascular: Normal heart rate noted  Respiratory: Normal respiratory effort, no problems with respiration noted  Abdomen: Soft, gravid, appropriate for gestational age. Pain/Pressure: Absent     Pelvic:  Cervical exam deferred        Extremities: Normal range of motion.  Edema: Trace  Mental Status: Normal mood and affect. Normal behavior. Normal judgment and thought content.   Urinalysis:      Assessment and Plan:  Pregnancy: G5P4004 at 2254w6d  1. Abnormal maternal serum screening test --abnormal Quad/AFP, NIPS wnl  2. Supervision of high risk pregnancy, antepartum, second trimester   3. Hx Pulmonary embolism, currently pregnant, antepartum --On Lovenox 80 mg BID.   Continue current therapy.   4. 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 Supervision of high risk pregnancy, antepartum   Preterm labor symptoms and general obstetric precautions including but not limited to vaginal bleeding, contractions, leaking of fluid and fetal movement were reviewed in detail with the patient. Please refer to After Visit Summary for other counseling recommendations.  Return in about 4 weeks (around 01/19/2016).  Hurshel PartyLisa A Leftwich-Kirby, CNM

## 2016-01-11 ENCOUNTER — Ambulatory Visit (HOSPITAL_COMMUNITY)
Admission: RE | Admit: 2016-01-11 | Discharge: 2016-01-11 | Disposition: A | Discharge status: Home / Self Care | Payer: Medicaid Other | Source: Ambulatory Visit | Attending: Family Medicine | Admitting: Family Medicine

## 2016-01-11 DIAGNOSIS — Z0489 Encounter for examination and observation for other specified reasons: Secondary | ICD-10-CM

## 2016-01-11 DIAGNOSIS — Z3A22 22 weeks gestation of pregnancy: Secondary | ICD-10-CM | POA: Diagnosis not present

## 2016-01-11 DIAGNOSIS — Z86718 Personal history of other venous thrombosis and embolism: Secondary | ICD-10-CM | POA: Diagnosis not present

## 2016-01-11 DIAGNOSIS — O281 Abnormal biochemical finding on antenatal screening of mother: Secondary | ICD-10-CM | POA: Diagnosis not present

## 2016-01-11 DIAGNOSIS — O2232 Deep phlebothrombosis in pregnancy, second trimester: Secondary | ICD-10-CM | POA: Diagnosis not present

## 2016-01-11 DIAGNOSIS — Z362 Encounter for other antenatal screening follow-up: Secondary | ICD-10-CM | POA: Diagnosis not present

## 2016-01-11 DIAGNOSIS — O99212 Obesity complicating pregnancy, second trimester: Secondary | ICD-10-CM | POA: Insufficient documentation

## 2016-01-11 DIAGNOSIS — IMO0002 Reserved for concepts with insufficient information to code with codable children: Secondary | ICD-10-CM

## 2016-01-13 ENCOUNTER — Other Ambulatory Visit (HOSPITAL_COMMUNITY): Payer: Self-pay | Admitting: *Deleted

## 2016-01-13 DIAGNOSIS — Z86718 Personal history of other venous thrombosis and embolism: Secondary | ICD-10-CM

## 2016-01-13 DIAGNOSIS — Z8759 Personal history of other complications of pregnancy, childbirth and the puerperium: Principal | ICD-10-CM

## 2016-01-14 ENCOUNTER — Encounter (HOSPITAL_COMMUNITY): Payer: Self-pay | Admitting: Emergency Medicine

## 2016-01-14 ENCOUNTER — Emergency Department (HOSPITAL_COMMUNITY)
Admission: EM | Admit: 2016-01-14 | Discharge: 2016-01-14 | Disposition: A | Discharge status: Home / Self Care | Payer: Medicaid Other | Attending: Emergency Medicine | Admitting: Emergency Medicine

## 2016-01-14 DIAGNOSIS — O9989 Other specified diseases and conditions complicating pregnancy, childbirth and the puerperium: Secondary | ICD-10-CM | POA: Insufficient documentation

## 2016-01-14 DIAGNOSIS — R590 Localized enlarged lymph nodes: Secondary | ICD-10-CM | POA: Diagnosis not present

## 2016-01-14 DIAGNOSIS — Z87891 Personal history of nicotine dependence: Secondary | ICD-10-CM | POA: Insufficient documentation

## 2016-01-14 DIAGNOSIS — Z3A20 20 weeks gestation of pregnancy: Secondary | ICD-10-CM | POA: Insufficient documentation

## 2016-01-14 LAB — CBC WITH DIFFERENTIAL/PLATELET
BASOS ABS: 0 10*3/uL (ref 0.0–0.1)
Basophils Relative: 0 %
EOS ABS: 0.1 10*3/uL (ref 0.0–0.7)
EOS PCT: 1 %
HCT: 31.5 % — ABNORMAL LOW (ref 36.0–46.0)
Hemoglobin: 10.1 g/dL — ABNORMAL LOW (ref 12.0–15.0)
LYMPHS PCT: 24 %
Lymphs Abs: 2.3 10*3/uL (ref 0.7–4.0)
MCH: 29.8 pg (ref 26.0–34.0)
MCHC: 32.1 g/dL (ref 30.0–36.0)
MCV: 92.9 fL (ref 78.0–100.0)
MONO ABS: 0.6 10*3/uL (ref 0.1–1.0)
Monocytes Relative: 7 %
Neutro Abs: 6.5 10*3/uL (ref 1.7–7.7)
Neutrophils Relative %: 68 %
PLATELETS: 216 10*3/uL (ref 150–400)
RBC: 3.39 MIL/uL — AB (ref 3.87–5.11)
RDW: 14.9 % (ref 11.5–15.5)
WBC: 9.5 10*3/uL (ref 4.0–10.5)

## 2016-01-14 MED ORDER — CLINDAMYCIN HCL 300 MG PO CAPS: 300.0000 mg | ORAL_CAPSULE | Freq: Three times a day (TID) | ORAL | 0 refills | Status: AC

## 2016-01-14 MED ORDER — CLINDAMYCIN HCL 300 MG PO CAPS: 300.0000 mg | ORAL_CAPSULE | Freq: Three times a day (TID) | ORAL | 0 refills | Status: DC

## 2016-01-14 NOTE — ED Notes (Signed)
[facility] contacted and informed that pt is here and her due date.

## 2016-01-14 NOTE — ED Provider Notes (Signed)
[facility]-EMERGENCY DEPT Provider Note   CSN: [id number] Arrival date & time: 01/14/16  [id number]  By signing my name below, I, Rosario AdieWilliam Andrew Hiatt, attest that this documentation has been prepared under the direction and in the presence of Shaune Pollackameron Saud Bail, MD. Electronically Signed: Rosario AdieWilliam Andrew Hiatt, ED Scribe. 01/14/16. 2:19 AM.  History   Chief Complaint Chief Complaint  Patient presents with  . Mass   The history is provided by the patient. No language interpreter was used.   HPI Comments: Nancy Francis is a 35 y.o. female who is [redacted] weeks pregnant, with PMHx of prior DVT/PE who presents to the Emergency Department complaining of moderate, unchanged area of swelling to the left-side of her neck which she noticed tonight. She notes sore-like pain to the area upon palpation or movement of her neck. No pain otherwise. Pt has not recently had any illnesses/infections. No recent sick contacts. Pt is currently on Lovenox anticoagulant therapy for her h/o prior DVT/PE. Denies sore throat, eye pain, ear pain, fever, cough, congestion, rhinorrhea, or any other associated symptoms. No weight loss, night sweats. She has been following up with an OBGYN regularly.  Past Medical History:  Diagnosis Date  . Abnormal Pap smear 2010   repeat was negative  . Anemia   . Anxiety   . Depression   . PE (pulmonary thromboembolism) (HCC)   . Sickle cell trait [facility](HCC)    Patient Active Problem List   Diagnosis Date Noted  . Abnormal maternal serum screening test 11/23/2015  . Supervision of high risk pregnancy, antepartum 10/29/2015  . Hx Pulmonary embolism, currently pregnant, antepartum 10/29/2015  . DVT of upper extremity (deep vein thrombosis) (HCC) 10/29/2015  . Bilateral pulmonary embolism (HCC) 12/06/2012  . Acute dyspnea 12/06/2012  . Chest pain 12/06/2012  . SVD (spontaneous vaginal delivery) 08/14 12/06/2012  . Sickle cell trait [facility](HCC)    Past Surgical History:  Procedure Laterality Date  .  COLPOSCOPY    . FRACTURE SURGERY Right    3 surgeries   OB History    Gravida Para Term Preterm AB Living   5 4 4  0 0 4   SAB TAB Ectopic Multiple Live Births   0 0 0 0 2     Home Medications    Prior to Admission medications   Medication Sig Start Date End Date Taking? Authorizing Provider  enoxaparin (LOVENOX) 100 MG/ML injection Inject 0.85 mLs (85 mg total) into the skin every 12 (twelve) hours. 11/20/15  Yes Rhona RaiderJacob J Sedgwick, DO  clindamycin (CLEOCIN) 300 MG capsule Take 1 capsule (300 mg total) by mouth 3 (three) times daily. 01/14/16 01/21/16  Shaune Pollackameron Jadarian Mckay, MD  enoxaparin (LOVENOX) 100 MG/ML injection Inject 0.85 mLs (85 mg total) into the skin every 12 (twelve) hours. Patient not taking: Reported on 01/14/2016 11/20/15   Rhona RaiderJacob J Spivak, DO  promethazine (PHENERGAN) 25 MG tablet Take 1 tablet (25 mg total) by mouth every 6 (six) hours as needed for nausea or vomiting. Patient not taking: Reported on 01/14/2016 10/18/15   Dorathy KinsmanVirginia Smith, CNM  sertraline (ZOLOFT) 50 MG tablet Take 1 tablet (50 mg total) by mouth daily. Patient not taking: Reported on 01/14/2016 11/20/15   Rhona RaiderJacob J Leipold, DO  terconazole (TERAZOL 7) 0.4 % vaginal cream Place 1 applicator vaginally at bedtime. Patient not taking: Reported on 01/14/2016 11/05/15   Marlis EdelsonWalidah N Karim, CNM   Family History Family History  Problem Relation Age of Onset  . Asthma Sister   . Diabetes Maternal Aunt  Social History Social History  Substance Use Topics  . Smoking status: Former Smoker    Packs/day: 0.25    Types: Cigarettes    Quit date: 11/04/2015  . Smokeless tobacco: Never Used  . Alcohol use No     Comment: once a week   Allergies   Penicillins  Review of Systems Review of Systems  Constitutional: Negative for chills and fever.  HENT: Negative for congestion, ear pain, rhinorrhea and sore throat.        Positive for small area of swelling to the left side of the neck.   Eyes: Negative for pain and visual  disturbance.  Respiratory: Negative for cough, shortness of breath and wheezing.   Cardiovascular: Negative for chest pain and leg swelling.  Gastrointestinal: Negative for abdominal pain, diarrhea, nausea and vomiting.  Genitourinary: Negative for dysuria, flank pain, vaginal bleeding and vaginal discharge.  Musculoskeletal: Positive for neck pain (left-sided, upon palpation).  Skin: Negative for rash.  Allergic/Immunologic: Negative for immunocompromised state.  Neurological: Negative for syncope and headaches.  Hematological: Positive for adenopathy. Does not bruise/bleed easily.  All other systems reviewed and are negative.  Physical Exam Updated Vital Signs BP 128/77 (BP Location: Right Arm)   Pulse 98   Temp 98.2 F (36.8 C) (Oral)   Resp 16   LMP 08/05/2015 (LMP Unknown)   SpO2 100%   Physical Exam  Constitutional: She is oriented to person, place, and time. She appears well-developed and well-nourished. No distress.  HENT:  Head: Normocephalic and atraumatic.  Right Ear: External ear normal. Tympanic membrane is not erythematous, not retracted and not bulging.  Left Ear: External ear normal. Tympanic membrane is not erythematous, not retracted and not bulging.  Mouth/Throat: Uvula is midline, oropharynx is clear and moist and mucous membranes are normal. No tonsillar exudate.  Eyes: Conjunctivae are normal.  Neck: Neck supple.  Cardiovascular: Normal rate, regular rhythm and normal heart sounds.  Exam reveals no friction rub.   No murmur heard. Pulmonary/Chest: Effort normal and breath sounds normal. No respiratory distress. She has no wheezes. She has no rales.  Abdominal: She exhibits no distension.  Musculoskeletal: Normal range of motion. She exhibits no edema.  Lymphadenopathy:    She has cervical adenopathy (single, round, mobile, approx 2 x 2 cm posterior chain lymph node on left, no overlying or surrounding erythema or streaking).  Neurological: She is alert and  oriented to person, place, and time. She exhibits normal muscle tone.  Skin: Skin is warm and dry. Capillary refill takes less than 2 seconds.  Psychiatric: She has a normal mood and affect. Her behavior is normal.  Nursing note and vitals reviewed.  ED Treatments / Results  DIAGNOSTIC STUDIES: Oxygen Saturation is 100% on RA, normal by my interpretation.   COORDINATION OF CARE: 2:19 AM-Discussed next steps with pt. Pt verbalized understanding and is agreeable with the plan.   Labs (all labs ordered are listed, but only abnormal results are displayed) Labs Reviewed  CBC WITH DIFFERENTIAL/PLATELET - Abnormal; Notable for the following:       Result Value   RBC 3.39 (*)    Hemoglobin 10.1 (*)    HCT 31.5 (*)    All other components within normal limits    EKG  EKG Interpretation None      Radiology No results found.  Procedures Procedures   Medications Ordered in ED Medications - No data to display    EMERGENCY DEPARTMENT US SOFT TISSUE INTERPRETATION "Study: Limited Ultrasound of  the noted body part in comments below"  INDICATIONS: Left neck swelling, tenderenss Multiple views of the body part are obtained with a multi-frequency linear probe  PERFORMED BY:  Myself IMAGES ARCHIVED?: Yes SIDE:Left BODY PART:Neck FINDINGS: No abcess noted and Cellulitis absent. Enlarged lymph node correlates to area of TTP. Vascular flow noted in lymph node. LIMITATIONS:  None INTERPRETATION:  No abscess or cellulitis. Area of pain correlates to lymph node seen on ultrasound.   Initial Impression / Assessment and Plan / ED Course  I have reviewed the triage vital signs and the nursing notes.  Pertinent labs & imaging results that were available during my care of the patient were reviewed by me and considered in my medical decision making (see chart for details).  Clinical Course   35 yo F currently [redacted] wk pregnant, with h/o DVT/PE on lovenox, who p/w soft, mildly tender  posterior cervical LAD. No fever, chills, or infectious symptoms. On exam, pt overall well-appearing and in NAD. No signs of concomitant AOM, periorbital/facial cellulitis, OE, or other infectious process. At this time, etiology for mild LAD unclear - primary suspicion is mild viral URI, early infectious process. Screening lab shows normal CBC and diff - doubt leukemia/lymphoma. Soft tissue U/S performed by myself confirms lump is lymph node and I do not see signs of abscess, cellulitis, or vascular abnormality or clot. Possible primary adenitis versus reactive adenitis. Given tenderness, will treat conservatively with clindamycin and advise outpt f/u. Return precautions given Will d/c home.  Final Clinical Impressions(s) / ED Diagnoses   Final diagnoses:  Cervical lymphadenopathy   New Prescriptions Discharge Medication List as of 01/14/2016  3:38 AM     I personally performed the services described in this documentation, which was scribed in my presence. The recorded information has been reviewed and is accurate.     Shaune Pollackameron Vincen Bejar, MD 01/14/16 0930

## 2016-01-14 NOTE — Progress Notes (Signed)
[facility] called to assess [facility] with complaints of a lump on her neck; patient is a G5P4 who is [redacted] weeks along in her pregnancy at this time; patient denies problems with this pregnancy at this time; patient states she has had some occassional back pain that is throbbing in nature that has been an on going issue; EFM applied at this time

## 2016-01-14 NOTE — Progress Notes (Signed)
EFM removed for discharge; patient OB cleared at this time; instructed patient on pregnancy precautions, patient verbalized understanding

## 2016-01-14 NOTE — ED Triage Notes (Addendum)
C/o lump on L side of neck that she noticed today.  States it is only painful when she palpates it.  Pt is pregnant.  Due 05/11/16.

## 2016-01-15 ENCOUNTER — Encounter (HOSPITAL_COMMUNITY): Payer: Self-pay | Admitting: *Deleted

## 2016-01-15 ENCOUNTER — Emergency Department (HOSPITAL_COMMUNITY)
Admission: EM | Admit: 2016-01-15 | Discharge: 2016-01-16 | Disposition: A | Discharge status: Home / Self Care | Payer: Medicaid Other | Attending: Emergency Medicine | Admitting: Emergency Medicine

## 2016-01-15 DIAGNOSIS — Z87891 Personal history of nicotine dependence: Secondary | ICD-10-CM | POA: Diagnosis not present

## 2016-01-15 DIAGNOSIS — F4321 Adjustment disorder with depressed mood: Secondary | ICD-10-CM | POA: Insufficient documentation

## 2016-01-15 DIAGNOSIS — Z7901 Long term (current) use of anticoagulants: Secondary | ICD-10-CM | POA: Insufficient documentation

## 2016-01-15 DIAGNOSIS — O0990 Supervision of high risk pregnancy, unspecified, unspecified trimester: Secondary | ICD-10-CM

## 2016-01-15 DIAGNOSIS — O280 Abnormal hematological finding on antenatal screening of mother: Secondary | ICD-10-CM

## 2016-01-15 DIAGNOSIS — Z3A22 22 weeks gestation of pregnancy: Secondary | ICD-10-CM | POA: Diagnosis not present

## 2016-01-15 DIAGNOSIS — O9A212 Injury, poisoning and certain other consequences of external causes complicating pregnancy, second trimester: Secondary | ICD-10-CM | POA: Insufficient documentation

## 2016-01-15 DIAGNOSIS — Z833 Family history of diabetes mellitus: Secondary | ICD-10-CM | POA: Diagnosis not present

## 2016-01-15 DIAGNOSIS — O88219 Thromboembolism in pregnancy, unspecified trimester: Secondary | ICD-10-CM

## 2016-01-15 DIAGNOSIS — O99342 Other mental disorders complicating pregnancy, second trimester: Secondary | ICD-10-CM | POA: Diagnosis not present

## 2016-01-15 DIAGNOSIS — T39311A Poisoning by propionic acid derivatives, accidental (unintentional), initial encounter: Secondary | ICD-10-CM | POA: Diagnosis not present

## 2016-01-15 DIAGNOSIS — Z825 Family history of asthma and other chronic lower respiratory diseases: Secondary | ICD-10-CM | POA: Diagnosis not present

## 2016-01-15 LAB — CBC
HCT: 30.3 % — ABNORMAL LOW (ref 36.0–46.0)
HEMOGLOBIN: 10.1 g/dL — AB (ref 12.0–15.0)
MCH: 30.7 pg (ref 26.0–34.0)
MCHC: 33.3 g/dL (ref 30.0–36.0)
MCV: 92.1 fL (ref 78.0–100.0)
PLATELETS: 244 10*3/uL (ref 150–400)
RBC: 3.29 MIL/uL — ABNORMAL LOW (ref 3.87–5.11)
RDW: 15 % (ref 11.5–15.5)
WBC: 8.7 10*3/uL (ref 4.0–10.5)

## 2016-01-15 LAB — RAPID URINE DRUG SCREEN, HOSP PERFORMED
AMPHETAMINES: NOT DETECTED
Barbiturates: NOT DETECTED
Benzodiazepines: NOT DETECTED
COCAINE: NOT DETECTED
OPIATES: NOT DETECTED
Tetrahydrocannabinol: NOT DETECTED

## 2016-01-15 LAB — COMPREHENSIVE METABOLIC PANEL
ALBUMIN: 3.9 g/dL (ref 3.5–5.0)
ALK PHOS: 72 U/L (ref 38–126)
ALT: 16 U/L (ref 14–54)
ANION GAP: 8 (ref 5–15)
AST: 15 U/L (ref 15–41)
BUN: 6 mg/dL (ref 6–20)
CALCIUM: 8.7 mg/dL — AB (ref 8.9–10.3)
CO2: 20 mmol/L — AB (ref 22–32)
Chloride: 113 mmol/L — ABNORMAL HIGH (ref 101–111)
Creatinine, Ser: 0.56 mg/dL (ref 0.44–1.00)
GFR calc non Af Amer: >60 mL/min (ref >60)
GLUCOSE: 76 mg/dL (ref 65–99)
POTASSIUM: 3.7 mmol/L (ref 3.5–5.1)
SODIUM: 141 mmol/L (ref 135–145)
Total Bilirubin: 0.5 mg/dL (ref 0.3–1.2)
Total Protein: 7.5 g/dL (ref 6.5–8.1)

## 2016-01-15 LAB — SALICYLATE LEVEL

## 2016-01-15 LAB — ETHANOL: Alcohol, Ethyl (B): 112 mg/dL — ABNORMAL HIGH (ref <5)

## 2016-01-15 LAB — ACETAMINOPHEN LEVEL: Acetaminophen (Tylenol), Serum: <10 ug/mL — ABNORMAL LOW (ref 10–30)

## 2016-01-15 MED ORDER — SERTRALINE HCL 50 MG PO TABS
50.0000 mg | ORAL_TABLET | Freq: Every day | ORAL | Status: DC
  Administered 2016-01-15 – 2016-01-16 (×2): 50 mg via ORAL
  Filled 2016-01-15 (×2): qty 1

## 2016-01-15 MED ORDER — ENOXAPARIN SODIUM 100 MG/ML ~~LOC~~ SOLN
85.0000 mg | Freq: Two times a day (BID) | SUBCUTANEOUS | Status: DC
  Administered 2016-01-15 – 2016-01-16 (×2): 85 mg via SUBCUTANEOUS
  Filled 2016-01-15 (×2): qty 1

## 2016-01-15 NOTE — ED Notes (Signed)
Report given to RN in SAPPU. Pt has been medically cleared by [facility] and their physicians as well as by the EDP

## 2016-01-15 NOTE — Progress Notes (Signed)
Spoke with dr Emelda FearFerguson informed of pt status, fhr tracing, uc pattern, pt getting evaluated by psych. OB cleared, pt may come off monitor.

## 2016-01-15 NOTE — ED Provider Notes (Signed)
[facility]-EMERGENCY DEPT Provider Note   CSN: [id number] Arrival date & time: 01/15/16  1455     History   Chief Complaint Chief Complaint  Patient presents with  . Ingestion    HPI Nancy Francis is a 35 y.o. female.  Patient [redacted] weeks pregnant, indicates at 1300 today took approximately 3-4 naproxen.  Patient notes recent stress and anxiety. Hx depression. States had been on meds for same, had been taken off previously, and that has been restarted on low dose zoloft, but doesn't feel it helps.  States recent financial problems adding to stress. After taking medication tried to make self vomit, but no pills returned. Denies any other ingestion, specifically, no asa or acetaminophen.  [facility]  Denies abd pain. No vaginal bleeding or discharge. Denies any other attempt to harm self.    The history is provided by the patient.  Ingestion  Pertinent negatives include no chest pain, no abdominal pain, no headaches and no shortness of breath.    Past Medical History:  Diagnosis Date  . Abnormal Pap smear 2010   repeat was negative  . Anemia   . Anxiety   . Depression   . PE (pulmonary thromboembolism) (HCC)   . Sickle cell trait [facility](HCC)     Patient Active Problem List   Diagnosis Date Noted  . Abnormal maternal serum screening test 11/23/2015  . Supervision of high risk pregnancy, antepartum 10/29/2015  . Hx Pulmonary embolism, currently pregnant, antepartum 10/29/2015  . DVT of upper extremity (deep vein thrombosis) (HCC) 10/29/2015  . Bilateral pulmonary embolism (HCC) 12/06/2012  . Acute dyspnea 12/06/2012  . Chest pain 12/06/2012  . SVD (spontaneous vaginal delivery) 08/14 12/06/2012  . Sickle cell trait [facility](HCC)     Past Surgical History:  Procedure Laterality Date  . COLPOSCOPY    . FRACTURE SURGERY Right    3 surgeries    OB History    Gravida Para Term Preterm AB Living   5 4 4  0 0 4   SAB TAB Ectopic Multiple Live Births   0 0 0 0 2         Home Medications    Prior to Admission medications   Medication Sig Start Date End Date Taking? Authorizing Provider  clindamycin (CLEOCIN) 300 MG capsule Take 1 capsule (300 mg total) by mouth 3 (three) times daily. 01/14/16 01/21/16  Shaune Pollackameron Isaacs, MD  enoxaparin (LOVENOX) 100 MG/ML injection Inject 0.85 mLs (85 mg total) into the skin every 12 (twelve) hours. 11/20/15   Rhona RaiderJacob J Cilento, DO  enoxaparin (LOVENOX) 100 MG/ML injection Inject 0.85 mLs (85 mg total) into the skin every 12 (twelve) hours. Patient not taking: Reported on 01/14/2016 11/20/15   Rhona RaiderJacob J Gervasi, DO  promethazine (PHENERGAN) 25 MG tablet Take 1 tablet (25 mg total) by mouth every 6 (six) hours as needed for nausea or vomiting. Patient not taking: Reported on 01/14/2016 10/18/15   Dorathy KinsmanVirginia Smith, CNM  sertraline (ZOLOFT) 50 MG tablet Take 1 tablet (50 mg total) by mouth daily. Patient not taking: Reported on 01/14/2016 11/20/15   Rhona RaiderJacob J Stanco, DO  terconazole (TERAZOL 7) 0.4 % vaginal cream Place 1 applicator vaginally at bedtime. Patient not taking: Reported on 01/14/2016 11/05/15   Marlis EdelsonWalidah N Karim, CNM    Family History Family History  Problem Relation Age of Onset  . Asthma Sister   . Diabetes Maternal Aunt     Social History Social History  Substance Use Topics  . Smoking  status: Former Smoker    Packs/day: 0.25    Types: Cigarettes    Quit date: 11/04/2015  . Smokeless tobacco: Never Used  . Alcohol use No     Comment: once a week     Allergies   Penicillins   Review of Systems Review of Systems  Constitutional: Negative for fever.  HENT: Negative for sore throat.   Eyes: Negative for redness.  Respiratory: Negative for shortness of breath.   Cardiovascular: Negative for chest pain.  Gastrointestinal: Negative for abdominal pain and vomiting.  Genitourinary: Negative for flank pain.  Musculoskeletal: Negative for back pain and neck pain.  Skin: Negative for rash.  Neurological: Negative  for headaches.  Hematological: Does not bruise/bleed easily.  Psychiatric/Behavioral: Positive for dysphoric mood. The patient is nervous/anxious.      Physical Exam Updated Vital Signs BP 119/75 (BP Location: Right Arm)   Pulse 120   Temp 98.5 F (36.9 C) (Oral)   Resp 16   LMP 08/05/2015 (LMP Unknown)   SpO2 98%   Physical Exam  Constitutional: She appears well-developed and well-nourished. No distress.  HENT:  Mouth/Throat: Oropharynx is clear and moist.  Eyes: Conjunctivae are normal. Pupils are equal, round, and reactive to light. No scleral icterus.  Neck: Neck supple. No tracheal deviation present.  Cardiovascular: Normal rate, regular rhythm, normal heart sounds and intact distal pulses.   Pulmonary/Chest: Effort normal and breath sounds normal. No respiratory distress.  Abdominal: Soft. Normal appearance and bowel sounds are normal. There is no tenderness.  Musculoskeletal: She exhibits no edema.  Neurological: She is alert.  Skin: Skin is warm and dry. No rash noted. She is not diaphoretic.  Psychiatric:  Depressed mood. Currently denies SI.   Nursing note and vitals reviewed.    ED Treatments / Results  Labs (all labs ordered are listed, but only abnormal results are displayed) Results for orders placed or performed during the hospital encounter of 01/15/16  CBC  Result Value Ref Range   WBC 8.7 4.0 - 10.5 K/uL   RBC 3.29 (L) 3.87 - 5.11 MIL/uL   Hemoglobin 10.1 (L) 12.0 - 15.0 g/dL   HCT 16.1 (L) 09.6 - 04.5 %   MCV 92.1 78.0 - 100.0 fL   MCH 30.7 26.0 - 34.0 pg   MCHC 33.3 30.0 - 36.0 g/dL   RDW 40.9 81.1 - 91.4 %   Platelets 244 150 - 400 K/uL  Comprehensive metabolic panel  Result Value Ref Range   Sodium 141 135 - 145 mmol/L   Potassium 3.7 3.5 - 5.1 mmol/L   Chloride 113 (H) 101 - 111 mmol/L   CO2 20 (L) 22 - 32 mmol/L   Glucose, Bld 76 65 - 99 mg/dL   BUN 6 6 - 20 mg/dL   Creatinine, Ser 7.82 0.44 - 1.00 mg/dL   Calcium 8.7 (L) 8.9 - 10.3  mg/dL   Total Protein 7.5 6.5 - 8.1 g/dL   Albumin 3.9 3.5 - 5.0 g/dL   AST 15 15 - 41 U/L   ALT 16 14 - 54 U/L   Alkaline Phosphatase 72 38 - 126 U/L   Total Bilirubin 0.5 0.3 - 1.2 mg/dL   GFR calc non Af Amer >60 >60 mL/min   GFR calc Af Amer >60 >60 mL/min   Anion gap 8 5 - 15  Acetaminophen level  Result Value Ref Range   Acetaminophen (Tylenol), Serum <10 (L) 10 - 30 ug/mL  Salicylate level  Result Value Ref Range  Salicylate Lvl <7.0 2.8 - 30.0 mg/dL  Ethanol  Result Value Ref Range   Alcohol, Ethyl (B) 112 (H) <5 mg/dL    EKG  EKG Interpretation None       Radiology No results found.  Procedures Procedures (including critical care time)  Medications Ordered in ED Medications - No data to display   Initial Impression / Assessment and Plan / ED Course  I have reviewed the triage vital signs and the nursing notes.  Pertinent labs & imaging results that were available during my care of the patient were reviewed by me and considered in my medical decision making (see chart for details).  Clinical Course    Iv ns. Labs.  [facility] response RN at bedside.  BH team consulted.  Rapid response indicates fetal monitoring unremarkable.  Recheck pt - fully awake and alert. No distress. abd soft nt.  Denies pain or other c/o.  BH team consulted [facility].     Final Clinical Impressions(s) / ED Diagnoses   Final diagnoses:  None    New Prescriptions New Prescriptions   No medications on file     Cathren LaineKevin [place] Mer[place], MD 01/15/16 1649
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
## 2016-01-15 NOTE — ED Triage Notes (Signed)
Per EMS pt from home with c/o of intentional ingestion, per EMS pt is [redacted] weeks pregnant and under a lot of stress lately, facing eviction from her apartment, feels "down on her luck" and in order to hurt herself she took 3-4 500 mg naproxen. Per EMS she sts she immediately felt bad about doing this, so she make herself vomit. Pt in NAD at this time, [employer] officer at bedside

## 2016-01-15 NOTE — ED Notes (Signed)
[id number] Expected date:  Expected time:  Means of arrival:  Comments: Rm 22

## 2016-01-15 NOTE — ED Notes (Signed)
Pt ambulated to restroom to collect urine sample. Rapid response OB approved of pt ambulating

## 2016-01-15 NOTE — ED Notes (Signed)
Bed: [id number] Expected date:  Expected time:  Means of arrival:  Comments: Ems od 22 naproxen [redacted] weeks pregnant

## 2016-01-15 NOTE — Progress Notes (Signed)
[facility] 23.[redacted] weeks pregnant, who took 3-4 500mg  naproxen. [facility] Placed on fetal monitor.

## 2016-01-15 NOTE — ED Notes (Addendum)
Patient placed into paper scrubs and wanded by secuirty. Pt admits to drinking "one miller."

## 2016-01-15 NOTE — ED Notes (Signed)
Pt oriented to room and unit.  Pt appears very sad and withdrawn.  Pt contracts for safety.  15 minute checks and video monitoring in place.

## 2016-01-15 NOTE — BH Assessment (Signed)
[facility] Assessment Progress Note   Case was staffed with Lord DNP who recommended patient be re-evaluated in the a.m.    

## 2016-01-15 NOTE — ED Notes (Signed)
Poison control called and cleared pt from there side.

## 2016-01-15 NOTE — ED Notes (Signed)
Rapid OB nurse at bedside 

## 2016-01-15 NOTE — ED Notes (Signed)
OB rapid response paged.  

## 2016-01-15 NOTE — ED Notes (Signed)
Nursing Progress Note: 7p-7a D: Pt currently presents with a flat affect and depressed behavior. Pt states "I feel fine just a little lost." Pt reports "good" sleep with current medication regimen.   A: Pt provided with medications per providers orders. Pt's labs and vitals were monitored throughout the night. Pt supported emotionally and encouraged to express concerns and questions. Pt educated on medications.  R: Pt's safety ensured with 15 minute and environmental checks. Pt currently denies SI/HI/Self Harm and A/V hallucinations. Pt verbally agrees to seek staff if SI/HI or A/VH occurs and to consult with staff before acting on any harmful thoughts. Will continue POC.

## 2016-01-16 DIAGNOSIS — Z825 Family history of asthma and other chronic lower respiratory diseases: Secondary | ICD-10-CM

## 2016-01-16 DIAGNOSIS — Z833 Family history of diabetes mellitus: Secondary | ICD-10-CM | POA: Diagnosis not present

## 2016-01-16 DIAGNOSIS — Z87891 Personal history of nicotine dependence: Secondary | ICD-10-CM | POA: Diagnosis not present

## 2016-01-16 DIAGNOSIS — Z79899 Other long term (current) drug therapy: Secondary | ICD-10-CM

## 2016-01-16 DIAGNOSIS — Z880 Allergy status to penicillin: Secondary | ICD-10-CM

## 2016-01-16 DIAGNOSIS — F4321 Adjustment disorder with depressed mood: Secondary | ICD-10-CM | POA: Diagnosis not present

## 2016-01-16 NOTE — ED Notes (Signed)
On the phone 

## 2016-01-16 NOTE — Consult Note (Signed)
[facility] Consult   Reason for Consult:  Suicidal  Referring Physician:  EDP Patient Identification: Nancy Francis MRN:  [id number] Principal Diagnosis: Adjustment disorder with depressed mood Diagnosis:   Patient Active Problem List   Diagnosis Date Noted  . Adjustment disorder with depressed mood [F43.21] 01/16/2016    Priority: High  . Abnormal maternal serum screening test [O28.0] 11/23/2015  . Supervision of high risk pregnancy, antepartum [O09.90] 10/29/2015  . Hx Pulmonary embolism, currently pregnant, antepartum [O88.219] 10/29/2015  . DVT of upper extremity (deep vein thrombosis) ([place]) [I82.629] 10/29/2015  . Bilateral pulmonary embolism ([place]) [I26.99] 12/06/2012  . Acute dyspnea [R06.00] 12/06/2012  . Chest pain [R07.9] 12/06/2012  . SVD (spontaneous vaginal delivery) 08/14 [O80] 12/06/2012  . Sickle cell trait ([place]) [D57.3]     Total Time spent with patient: 45 minutes  Subjective:   Nancy Francis is a 35 y.o. female patient presented with suicidal ideations.  HPI:  35 yo female who presented to the ED with suicidal ideations and taking twice the dosage of Naproxen.  Today, she is regretful of her actions and happy to be alive.  She has four children (15, 11, 8, 3) and [redacted] weeks pregnant.  Stressed about finances and her blood clot issues.  Supportive father of her other children, this baby's father is not supportive.  No suicidal/homicidal ideations, hallucinations.  She reports she drank a beer yesterday and has used cocaine in the past.  No withdrawal symptoms.  Nancy Francis reports if she feels stressed and depressed again than she will call a friend or go see one.  She was seeing Maudie Mercury at [place] who was helpful in the past, referral to return.  Stable for discharge, care at [facility] who has restarted her Zoloft for depression.  Past Psychiatric History: depression  Risk to Self: Suicidal Ideation: Yes-Currently Present Suicidal Intent:  Yes-Currently Present Is patient at risk for suicide?: Yes Suicidal Plan?: Yes-Currently Present Specify Current Suicidal Plan: Overdose Access to Means: Yes Specify Access to Suicidal Means: Overdose on OTC medications What has been your use of drugs/alcohol within the last 12 months?: Denies How many times?: 0 Other Self Harm Risks: none Triggers for Past Attempts: Unknown Intentional Self Injurious Behavior: None Risk to Others: Homicidal Ideation: No Thoughts of Harm to Others: No Current Homicidal Intent: No Current Homicidal Plan: No Access to Homicidal Means: No Identified Victim: na History of harm to others?: No Assessment of Violence: None Noted Violent Behavior Description: na Does patient have access to weapons?: No Criminal Charges Pending?: No Does patient have a court date: No Prior Inpatient Therapy: Prior Inpatient Therapy: No Prior Therapy Dates: na Prior Therapy Facilty/Provider(s): na Reason for Treatment: na Prior Outpatient Therapy: Prior Outpatient Therapy: Yes Prior Therapy Dates: 2017 Prior Therapy Facilty/Provider(s): [facility] Reason for Treatment: MDD, PTSD Does patient have an ACCT team?: No Does patient have Intensive In-House Services?  : No Does patient have [facility] services? : No Does patient have P4[facility] services?: No  Past Medical History:  Past Medical History:  Diagnosis Date  . Abnormal Pap smear 2010   repeat was negative  . Anemia   . Anxiety   . Depression   . PE (pulmonary thromboembolism) ([place])   . Sickle cell trait [facility])     Past Surgical History:  Procedure Laterality Date  . COLPOSCOPY    . FRACTURE SURGERY Right    3 surgeries   Family History:  Family History  Problem Relation Age of Onset  .  Asthma Sister   . Diabetes Maternal Aunt    Family Psychiatric  History: none Social History:  History  Alcohol Use No    Comment: once a week     History  Drug Use No    Social History   Social History  .  Marital status: Single    Spouse name: N/A  . Number of children: N/A  . Years of education: N/A   Social History Main Topics  . Smoking status: Former Smoker    Packs/day: 0.25    Types: Cigarettes    Quit date: 11/04/2015  . Smokeless tobacco: Never Used  . Alcohol use No     Comment: once a week  . Drug use: No  . Sexual activity: Yes    Birth control/ protection: IUD   Other Topics Concern  . None   Social History Narrative  . None   Additional Social History:    Allergies:   Allergies  Allergen Reactions  . Penicillins Hives    Has patient had a PCN reaction causing immediate rash, facial/tongue/throat swelling, SOB or lightheadedness with hypotension: Yes Has patient had a PCN reaction causing severe rash involving mucus membranes or skin necrosis: Yes Has patient had a PCN reaction that required hospitalization Yes Has patient had a PCN reaction occurring within the last 10 years: No If all of the above answers are "NO", then may proceed with Cephalosporin use.     Labs:  Results for orders placed or performed during the hospital encounter of 01/15/16 (from the past 48 hour(s))  Rapid urine drug screen (hospital performed)     Status: None   Collection Time: 01/15/16  3:12 PM  Result Value Ref Range   Opiates NONE DETECTED NONE DETECTED   Cocaine NONE DETECTED NONE DETECTED   Benzodiazepines NONE DETECTED NONE DETECTED   Amphetamines NONE DETECTED NONE DETECTED   Tetrahydrocannabinol NONE DETECTED NONE DETECTED   Barbiturates NONE DETECTED NONE DETECTED    Comment:        DRUG SCREEN FOR MEDICAL PURPOSES ONLY.  IF CONFIRMATION IS NEEDED FOR ANY PURPOSE, NOTIFY LAB WITHIN 5 DAYS.        LOWEST DETECTABLE LIMITS FOR URINE DRUG SCREEN Drug Class       Cutoff (ng/mL) Amphetamine      1000 Barbiturate      200 Benzodiazepine   381 Tricyclics       829 Opiates          300 Cocaine          300 THC              50   CBC     Status: Abnormal    Collection Time: 01/15/16  3:40 PM  Result Value Ref Range   WBC 8.7 4.0 - 10.5 K/uL   RBC 3.29 (L) 3.87 - 5.11 MIL/uL   Hemoglobin 10.1 (L) 12.0 - 15.0 g/dL   HCT 30.3 (L) 36.0 - 46.0 %   MCV 92.1 78.0 - 100.0 fL   MCH 30.7 26.0 - 34.0 pg   MCHC 33.3 30.0 - 36.0 g/dL   RDW 15.0 11.5 - 15.5 %   Platelets 244 150 - 400 K/uL  Comprehensive metabolic panel     Status: Abnormal   Collection Time: 01/15/16  3:40 PM  Result Value Ref Range   Sodium 141 135 - 145 mmol/L   Potassium 3.7 3.5 - 5.1 mmol/L   Chloride 113 (H) 101 - 111 mmol/L  CO2 20 (L) 22 - 32 mmol/L   Glucose, Bld 76 65 - 99 mg/dL   BUN 6 6 - 20 mg/dL   Creatinine, Ser 0.56 0.44 - 1.00 mg/dL   Calcium 8.7 (L) 8.9 - 10.3 mg/dL   Total Protein 7.5 6.5 - 8.1 g/dL   Albumin 3.9 3.5 - 5.0 g/dL   AST 15 15 - 41 U/L   ALT 16 14 - 54 U/L   Alkaline Phosphatase 72 38 - 126 U/L   Total Bilirubin 0.5 0.3 - 1.2 mg/dL   GFR calc non Af Amer >60 >60 mL/min   GFR calc Af Amer >60 >60 mL/min    Comment: (NOTE) The eGFR has been calculated using the CKD EPI equation. This calculation has not been validated in all clinical situations. eGFR's persistently <60 mL/min signify possible Chronic Kidney Disease.    Anion gap 8 5 - 15  Acetaminophen level     Status: Abnormal   Collection Time: 01/15/16  3:41 PM  Result Value Ref Range   Acetaminophen (Tylenol), Serum <10 (L) 10 - 30 ug/mL    Comment:        THERAPEUTIC CONCENTRATIONS VARY SIGNIFICANTLY. A RANGE OF 10-30 ug/mL MAY BE AN EFFECTIVE CONCENTRATION FOR MANY PATIENTS. HOWEVER, SOME ARE BEST TREATED AT CONCENTRATIONS OUTSIDE THIS RANGE. ACETAMINOPHEN CONCENTRATIONS >150 ug/mL AT 4 HOURS AFTER INGESTION AND >50 ug/mL AT 12 HOURS AFTER INGESTION ARE OFTEN ASSOCIATED WITH TOXIC REACTIONS.   Salicylate level     Status: None   Collection Time: 01/15/16  3:41 PM  Result Value Ref Range   Salicylate Lvl <2.4 2.8 - 30.0 mg/dL  Ethanol     Status: Abnormal   Collection  Time: 01/15/16  3:41 PM  Result Value Ref Range   Alcohol, Ethyl (B) 112 (H) <5 mg/dL    Comment:        LOWEST DETECTABLE LIMIT FOR SERUM ALCOHOL IS 5 mg/dL FOR MEDICAL PURPOSES ONLY     Current Facility-Administered Medications  Medication Dose Route Frequency Provider Last Rate Last Dose  . enoxaparin (LOVENOX) injection 85 mg  85 mg Subcutaneous Q12H Lajean Saver, MD   85 mg at 01/16/16 0817  . sertraline (ZOLOFT) tablet 50 mg  50 mg Oral Daily Lajean Saver, MD   50 mg at 01/16/16 1021   Current Outpatient Prescriptions  Medication Sig Dispense Refill  . clindamycin (CLEOCIN) 300 MG capsule Take 1 capsule (300 mg total) by mouth 3 (three) times daily. (Patient taking differently: Take 300 mg by mouth 3 (three) times daily. Started 11/02 to 11/09) 21 capsule 0  . enoxaparin (LOVENOX) 100 MG/ML injection Inject 0.85 mLs (85 mg total) into the skin every 12 (twelve) hours. 60 Syringe 4  . Naproxen (NAPROSYN PO) Take 22 tablets by mouth once.    . enoxaparin (LOVENOX) 100 MG/ML injection Inject 0.85 mLs (85 mg total) into the skin every 12 (twelve) hours. (Patient not taking: Reported on 01/15/2016) 50 Syringe 6  . promethazine (PHENERGAN) 25 MG tablet Take 1 tablet (25 mg total) by mouth every 6 (six) hours as needed for nausea or vomiting. (Patient not taking: Reported on 01/15/2016) 30 tablet 1  . sertraline (ZOLOFT) 50 MG tablet Take 1 tablet (50 mg total) by mouth daily. (Patient not taking: Reported on 01/15/2016) 30 tablet 3  . terconazole (TERAZOL 7) 0.4 % vaginal cream Place 1 applicator vaginally at bedtime. (Patient not taking: Reported on 01/15/2016) 45 g 0    Musculoskeletal: Strength & Muscle  Tone: within normal limits Gait & Station: normal Patient leans: N/A  Psychiatric Specialty Exam: Physical Exam  Constitutional: She is oriented to person, place, and time. She appears well-developed and well-nourished.  HENT:  Head: Normocephalic.  Neck: Normal range of motion.   Respiratory: Effort normal.  Musculoskeletal: Normal range of motion.  Neurological: She is alert and oriented to person, place, and time.  Skin: Skin is warm and dry.  Psychiatric: Her speech is normal and behavior is normal. Judgment and thought content normal. Cognition and memory are normal. She exhibits a depressed mood.    Review of Systems  Constitutional: Negative.   HENT: Negative.   Eyes: Negative.   Respiratory: Negative.   Cardiovascular: Negative.   Gastrointestinal: Negative.   Genitourinary: Negative.   Musculoskeletal: Negative.   Skin: Negative.   Neurological: Negative.   Endo/Heme/Allergies: Negative.   Psychiatric/Behavioral: Positive for depression.    Blood pressure 109/59, pulse 87, temperature 98.2 F (36.8 C), temperature source Oral, resp. rate 16, last menstrual period 08/05/2015, SpO2 99 %.There is no height or weight on file to calculate BMI.  General Appearance: Casual  Eye Contact:  Good  Speech:  Normal Rate  Volume:  Normal  Mood:  Depressed, mild  Affect:  Congruent  Thought Process:  Coherent and Descriptions of Associations: Intact  Orientation:  Full (Time, Place, and Person)  Thought Content:  WDL  Suicidal Thoughts:  No  Homicidal Thoughts:  No  Memory:  Immediate;   Good Recent;   Good Remote;   Good  Judgement:  Fair  Insight:  Good  Psychomotor Activity:  Normal  Concentration:  Concentration: Good and Attention Span: Good  Recall:  Good  Fund of Knowledge:  Good  Language:  Good  Akathisia:  No  Handed:  Right  AIMS (if indicated):     Assets:  Housing Leisure Time Physical Health Resilience Social Support  ADL's:  Intact  Cognition:  WNL  Sleep:        Treatment Plan Summary: Daily contact with patient to assess and evaluate symptoms and progress in treatment, Medication management and Plan adjusment disorder with depressed mood:  -Crisis stabilization -Medication management:  Continue medical medication along  with Zoloft 50 mg daily for depression. -Individual counseling  Disposition: No evidence of imminent risk to self or others at present.    Waylan Boga, NP 01/16/2016 10:56 AM  Patient seen face-to-face for psychiatric evaluation, chart reviewed and case discussed with the physician extender and developed treatment plan. Reviewed the information documented and agree with the treatment plan. Corena Pilgrim, MD

## 2016-01-16 NOTE — [facility] Suicide Risk Assessment (Signed)
Suicide Risk Assessment  Discharge Assessment   [facility]   Principal Problem: Adjustment disorder with depressed mood Discharge Diagnoses:  Patient Active Problem List   Diagnosis Date Noted  . Adjustment disorder with depressed mood [F43.21] 01/16/2016    Priority: High  . Abnormal maternal serum screening test [O28.0] 11/23/2015  . Supervision of high risk pregnancy, antepartum [O09.90] 10/29/2015  . Hx Pulmonary embolism, currently pregnant, antepartum [O88.219] 10/29/2015  . DVT of upper extremity (deep vein thrombosis) ([facility]) [I82.629] 10/29/2015  . Bilateral pulmonary embolism ([facility]) [I26.99] 12/06/2012  . Acute dyspnea [R06.00] 12/06/2012  . Chest pain [R07.9] 12/06/2012  . SVD (spontaneous vaginal delivery) 08/14 [O80] 12/06/2012  . Sickle cell trait ([facility]) [D57.3]     Total Time spent with patient: 45 minutes   Musculoskeletal: Strength & Muscle Tone: within normal limits Gait & Station: normal Patient leans: N/A  Psychiatric Specialty Exam: Physical Exam  Constitutional: She is oriented to person, place, and time. She appears well-developed and well-nourished.  HENT:  Head: Normocephalic.  Neck: Normal range of motion.  Respiratory: Effort normal.  Musculoskeletal: Normal range of motion.  Neurological: She is alert and oriented to person, place, and time.  Skin: Skin is warm and dry.  Psychiatric: Her speech is normal and behavior is normal. Judgment and thought content normal. Cognition and memory are normal. She exhibits a depressed mood.    Review of Systems  Constitutional: Negative.   HENT: Negative.   Eyes: Negative.   Respiratory: Negative.   Cardiovascular: Negative.   Gastrointestinal: Negative.   Genitourinary: Negative.   Musculoskeletal: Negative.   Skin: Negative.   Neurological: Negative.   Endo/Heme/Allergies: Negative.   Psychiatric/Behavioral: Positive for depression.    Blood pressure 109/59, pulse 87,  temperature 98.2 F (36.8 C), temperature source Oral, resp. rate 16, last menstrual period 08/05/2015, SpO2 99 %.There is no height or weight on file to calculate BMI.  General Appearance: Casual  Eye Contact:  Good  Speech:  Normal Rate  Volume:  Normal  Mood:  Depressed, mild  Affect:  Congruent  Thought Process:  Coherent and Descriptions of Associations: Intact  Orientation:  Full (Time, Place, and Person)  Thought Content:  WDL  Suicidal Thoughts:  No  Homicidal Thoughts:  No  Memory:  Immediate;   Good Recent;   Good Remote;   Good  Judgement:  Fair  Insight:  Good  Psychomotor Activity:  Normal  Concentration:  Concentration: Good and Attention Span: Good  Recall:  Good  Fund of Knowledge:  Good  Language:  Good  Akathisia:  No  Handed:  Right  AIMS (if indicated):     Assets:  Housing Leisure Time Physical Health Resilience Social Support  ADL's:  Intact  Cognition:  WNL  Sleep:       Mental Status Per Nursing Assessment::   On Admission:   suicidal   Demographic Factors:  NA  Loss Factors: Financial problems/change in socioeconomic status  Historical Factors: NA  Risk Reduction Factors:   Pregnancy, Responsible for children under 33 years of age, Sense of responsibility to family, Living with another person, especially a relative and Positive social support  Continued Clinical Symptoms:  Depression, mild  Cognitive Features That Contribute To Risk:  None    Suicide Risk:  Minimal: No identifiable suicidal ideation.  Patients presenting with no risk factors but with morbid ruminations; may be classified as minimal risk based on the severity of the depressive symptoms  Plan Of Care/Follow-up recommendations:  Activity:  as tolerated Diet:  heart healthy diet  Luther Newhouse, NP 01/16/2016, 11:10 AM

## 2016-01-16 NOTE — ED Notes (Signed)
NAD,  resting quielty, encouraged to eat.  Pt reports that she has had morning sickness, but it has improved.  Pt reports she is on lovenox for PE and blood clot lt upper arm that occurred early in this pregnancy.

## 2016-01-16 NOTE — Progress Notes (Signed)
CSW spoke with patient at bedside. CSW and patient [facility]. CS[facility] provided patient with [facility]out [facility] and contact information to follow up with [facility]. CSW also provided patient with crisis mobile  therapeutic alternatives phone number ([phone]) and informed patient that she could call this number 24 hours a day. CSW asked patient if she had any questions, patient replied no.

## 2016-01-16 NOTE — ED Notes (Signed)
Pt denies si/hi/avh on dc.  Written dc instructions reviewed with pt.  Pt encouraged to take medications as directed, follow up as scheduled and contact her OP mental health provider to schedule follow up.  Pt encouraged to seek treatment for any change or worsening condition, si/hi/avh.  Pt verbalized understanding.  Pt ambulatory w/o difficulty to dc area, belongings returned after leaving the unit.

## 2016-01-19 ENCOUNTER — Encounter: Payer: Self-pay | Admitting: Advanced Practice Midwife

## 2016-01-19 ENCOUNTER — Ambulatory Visit (INDEPENDENT_AMBULATORY_CARE_PROVIDER_SITE_OTHER): Payer: Medicaid Other | Admitting: Advanced Practice Midwife

## 2016-01-19 VITALS — BP 114/65 | HR 98 | Wt 187.4 lb

## 2016-01-19 DIAGNOSIS — O0992 Supervision of high risk pregnancy, unspecified, second trimester: Secondary | ICD-10-CM

## 2016-01-19 NOTE — Progress Notes (Signed)
   PRENATAL VISIT NOTE  Subjective:  Nancy Francis is a 35 y.o. G5P4004 at 267w6d being seen today for ongoing prenatal care.  She is currently monitored for the following issues for this high-risk pregnancy and has Sickle cell trait ([facility]); Bilateral pulmonary embolism ([facility]); Acute dyspnea; Chest pain; SVD (spontaneous vaginal delivery) 08/14; Supervision of high risk pregnancy, antepartum; Hx Pulmonary embolism, currently pregnant, antepartum; DVT of upper extremity (deep vein thrombosis) ([facility]); Abnormal maternal serum screening test; and Adjustment disorder with depressed mood on her problem list.  Patient reports no complaints and but several days [facility]. States feels better now.  Will follow up with counselor.  Contractions: Not present. Vag. Bleeding: None.  Movement: Present. Denies leaking of fluid.   The following portions of the patient's history were reviewed and updated as appropriate: allergies, current medications, past family history, past medical history, past social history, past surgical history and problem list. Problem list updated.  Objective:   Vitals:   01/19/16 1258  BP: 114/65  Pulse: 98  Weight: 187 lb 6.4 oz (85 kg)    Fetal Status: Fetal Heart Rate (bpm): 152   Movement: Present     General:  Alert, oriented and cooperative. Patient is in no acute distress.  Skin: Skin is warm and dry. No rash noted.   Cardiovascular: Normal heart rate noted  Respiratory: Normal respiratory effort, no problems with respiration noted  Abdomen: Soft, gravid, appropriate for gestational age. Pain/Pressure: Present     Pelvic:  Cervical exam deferred        Extremities: Normal range of motion.  Edema: Trace  Mental Status: Normal mood and affect. Normal behavior. Normal judgment and thought content.   Assessment and Plan:  Pregnancy: G5P4004 at 7367w6d  1. Supervision of high risk pregnancy, antepartum, second trimester      Discussed warning  signs for this stage of pregnancy      Discussed follow up with our counselor, agreeable      Glucose, 2 hr next visit  Preterm labor symptoms and general obstetric precautions including but not limited to vaginal bleeding, contractions, leaking of fluid and fetal movement were reviewed in detail with the patient. Please refer to After Visit Summary for other counseling recommendations.  Return in about 4 weeks (around 02/16/2016), or 2 hour Glucose next visit, need to be fasting.   Aviva SignsMarie L Verline Kong, CNM

## 2016-01-19 NOTE — Patient Instructions (Signed)

## 2016-01-19 NOTE — Progress Notes (Signed)
Pt states she had admission to [facility] recently due to suicidal thoughts.  She is feeling better now.

## 2016-01-20 ENCOUNTER — Inpatient Hospital Stay (HOSPITAL_COMMUNITY)
Admission: AD | Admit: 2016-01-20 | Discharge: 2016-01-20 | Disposition: A | Discharge status: Home / Self Care | Payer: Medicaid Other | Source: Ambulatory Visit | Attending: Family Medicine | Admitting: Family Medicine

## 2016-01-20 ENCOUNTER — Encounter (HOSPITAL_COMMUNITY): Payer: Self-pay

## 2016-01-20 ENCOUNTER — Other Ambulatory Visit: Payer: Self-pay | Admitting: Advanced Practice Midwife

## 2016-01-20 DIAGNOSIS — Z3A24 24 weeks gestation of pregnancy: Secondary | ICD-10-CM | POA: Diagnosis not present

## 2016-01-20 DIAGNOSIS — O4692 Antepartum hemorrhage, unspecified, second trimester: Secondary | ICD-10-CM | POA: Diagnosis present

## 2016-01-20 DIAGNOSIS — O2312 Infections of bladder in pregnancy, second trimester: Secondary | ICD-10-CM | POA: Diagnosis not present

## 2016-01-20 DIAGNOSIS — Z87891 Personal history of nicotine dependence: Secondary | ICD-10-CM | POA: Diagnosis not present

## 2016-01-20 DIAGNOSIS — Z86711 Personal history of pulmonary embolism: Secondary | ICD-10-CM | POA: Diagnosis not present

## 2016-01-20 DIAGNOSIS — O280 Abnormal hematological finding on antenatal screening of mother: Secondary | ICD-10-CM

## 2016-01-20 DIAGNOSIS — O2342 Unspecified infection of urinary tract in pregnancy, second trimester: Secondary | ICD-10-CM

## 2016-01-20 DIAGNOSIS — O88219 Thromboembolism in pregnancy, unspecified trimester: Secondary | ICD-10-CM

## 2016-01-20 DIAGNOSIS — O0990 Supervision of high risk pregnancy, unspecified, unspecified trimester: Secondary | ICD-10-CM

## 2016-01-20 LAB — URINALYSIS, ROUTINE W REFLEX MICROSCOPIC
Bilirubin Urine: NEGATIVE
GLUCOSE, UA: NEGATIVE mg/dL
Ketones, ur: 15 mg/dL — AB
Leukocytes, UA: NEGATIVE
Nitrite: POSITIVE — AB
PH: 7.5 (ref 5.0–8.0)
Protein, ur: 100 mg/dL — AB
SPECIFIC GRAVITY, URINE: 1.02 (ref 1.005–1.030)

## 2016-01-20 LAB — URINE MICROSCOPIC-ADD ON
BACTERIA UA: NONE SEEN
WBC, UA: NONE SEEN WBC/hpf (ref 0–5)

## 2016-01-20 LAB — RAPID URINE DRUG SCREEN, HOSP PERFORMED
AMPHETAMINES: NOT DETECTED
BARBITURATES: NOT DETECTED
BENZODIAZEPINES: NOT DETECTED
Cocaine: NOT DETECTED
Opiates: NOT DETECTED
Tetrahydrocannabinol: NOT DETECTED

## 2016-01-20 LAB — WET PREP, GENITAL
CLUE CELLS WET PREP: NONE SEEN
Sperm: NONE SEEN
TRICH WET PREP: NONE SEEN
YEAST WET PREP: NONE SEEN

## 2016-01-20 LAB — CBC
HCT: 31.3 % — ABNORMAL LOW (ref 36.0–46.0)
Hemoglobin: 10.4 g/dL — ABNORMAL LOW (ref 12.0–15.0)
MCH: 30.5 pg (ref 26.0–34.0)
MCHC: 33.2 g/dL (ref 30.0–36.0)
MCV: 91.8 fL (ref 78.0–100.0)
PLATELETS: 241 10*3/uL (ref 150–400)
RBC: 3.41 MIL/uL — AB (ref 3.87–5.11)
RDW: 15.3 % (ref 11.5–15.5)
WBC: 7 10*3/uL (ref 4.0–10.5)

## 2016-01-20 LAB — FETAL FIBRONECTIN: Fetal Fibronectin: NEGATIVE

## 2016-01-20 MED ORDER — PHENAZOPYRIDINE HCL 100 MG PO TABS: 100.0000 mg | ORAL_TABLET | Freq: Three times a day (TID) | ORAL | 0 refills | Status: DC | PRN

## 2016-01-20 MED ORDER — LACTATED RINGERS IV BOLUS (SEPSIS)
1000.0000 mL | Freq: Once | INTRAVENOUS | Status: AC
  Administered 2016-01-20: 1000 mL via INTRAVENOUS

## 2016-01-20 MED ORDER — NITROFURANTOIN MONOHYD MACRO 100 MG PO CAPS: 100.0000 mg | ORAL_CAPSULE | Freq: Two times a day (BID) | ORAL | 0 refills | Status: DC

## 2016-01-20 NOTE — Discharge Instructions (Signed)

## 2016-01-20 NOTE — MAU Note (Signed)
Ctx started 3 days ago and got worse today. Just started bleeding

## 2016-01-20 NOTE — MAU Provider Note (Signed)
MAU HISTORY AND PHYSICAL  Chief Complaint:  Contractions and Vaginal Bleeding   Nancy Francis is a 35 y.o.  G5P4004  at 6882w0d presenting for Contractions and Vaginal Bleeding . Patient states she has been having  regular, every 2-3 minutes contractions, minimal vaginal bleeding, intact membranes, with none fetal movement.    Has been having contractions since Monday that are increasing in frequency. Noticed blood in her urine today when giving a urine sample. Endorses urinary frequency. Has not had intercourse in past month.   Recently hospitalized for suicidal ideation, denies current SI, no plan. Has never attempted suicide before or had suicidal thoughts prior to this recent episode.  Denies drug use. Has had 4 prior pregnancies without history of preterm birth.  Hx of prior PE and upper extremity clot, currently takes Lovenox daily.   Past Medical History:  Diagnosis Date  . Abnormal Pap smear 2010   repeat was negative  . Anemia   . Anxiety   . Depression   . PE (pulmonary thromboembolism) (HCC)   . Sickle cell trait [facility](HCC)     Past Surgical History:  Procedure Laterality Date  . COLPOSCOPY    . FRACTURE SURGERY Right    3 surgeries    Family History  Problem Relation Age of Onset  . Asthma Sister   . Diabetes Maternal Aunt     Social History  Substance Use Topics  . Smoking status: Former Smoker    Packs/day: 0.25    Types: Cigarettes    Quit date: 11/04/2015  . Smokeless tobacco: Never Used  . Alcohol use No     Comment: once a week    Allergies  Allergen Reactions  . Penicillins Hives    Has patient had a PCN reaction causing immediate rash, facial/tongue/throat swelling, SOB or lightheadedness with hypotension: Yes Has patient had a PCN reaction causing severe rash involving mucus membranes or skin necrosis: Yes Has patient had a PCN reaction that required hospitalization Yes Has patient had a PCN reaction occurring within the last 10 years:  No If all of the above answers are "NO", then may proceed with Cephalosporin use.     Prescriptions Prior to Admission  Medication Sig Dispense Refill Last Dose  . clindamycin (CLEOCIN) 300 MG capsule Take 1 capsule (300 mg total) by mouth 3 (three) times daily. (Patient taking differently: Take 300 mg by mouth 3 (three) times daily. Started 11/02 to 11/09) 21 capsule 0 01/19/2016 at Unknown time  . promethazine (PHENERGAN) 25 MG tablet Take 1 tablet (25 mg total) by mouth every 6 (six) hours as needed for nausea or vomiting. 30 tablet 1 Past Week at Unknown time  . sertraline (ZOLOFT) 50 MG tablet Take 1 tablet (50 mg total) by mouth daily. 30 tablet 3 01/19/2016 at Unknown time  . enoxaparin (LOVENOX) 100 MG/ML injection Inject 0.85 mLs (85 mg total) into the skin every 12 (twelve) hours. 60 Syringe 4 01/18/2016  . enoxaparin (LOVENOX) 100 MG/ML injection Inject 0.85 mLs (85 mg total) into the skin every 12 (twelve) hours. (Patient not taking: Reported on 01/19/2016) 50 Syringe 6 Not Taking    Review of Systems - Negative except for what is mentioned in HPI.  Physical Exam  Blood pressure 130/70, pulse 113, temperature 98.5 F (36.9 C), resp. rate 20, height 5\' 4"  (1.626 m), weight 86.2 kg (190 lb), last menstrual period 08/05/2015. GENERAL: Well-developed, well-nourished female in no acute distress.  LUNGS: No respiratory distress HEART: Regular rate ABDOMEN: Soft,  gravid abdomen, CVA tenderness on right side, tender to palpation diffusely EXTREMITIES: Nontender, no edema, 2+ distal pulses. GU: moderate amount of thick white discharge, cervix visually closed, no blood noted FHT:  Baseline 150, moderate variability, accelerations present, no decelerations seen Contractions: none by [employer]        Labs: Results for orders placed or performed during the hospital encounter of 01/20/16 (from the past 24 hour(s))  CBC   Collection Time: 01/20/16 10:00 AM  Result Value Ref Range   WBC 7.0 4.0  - 10.5 K/uL   RBC 3.41 (L) 3.87 - 5.11 MIL/uL   Hemoglobin 10.4 (L) 12.0 - 15.0 g/dL   HCT 16.131.3 (L) 09.636.0 - 04.546.0 %   MCV 91.8 78.0 - 100.0 fL   MCH 30.5 26.0 - 34.0 pg   MCHC 33.2 30.0 - 36.0 g/dL   RDW 40.915.3 81.111.5 - 91.415.5 %   Platelets 241 150 - 400 K/uL    Imaging Studies:  [place] Mfm Ob Follow Up  Result Date: 01/11/2016 OBSTETRICAL ULTRASOUND: This exam was performed within a [facility]. The OB US report was generated in the AS system, and faxed to the ordering physician.  This report is available in the [employer] PACS. See the AS Obstetric US report via the Image Link.   Assessment: Nancy Francis is  35 y.o. G5P4004 at 350w0d presents with Contractions and Vaginal Bleeding . MDM 1 Liter NS bolus given in MAU FFN CBC Wet prep, GC/CT UDS, UA  Patient more comfortable after fluids, UA indicative of bladder infection, will treat with course of antibiotics and pyridium. Patient stable for discharge home.  Plan:  Bladder infection Rx given for 7 day course of Macrobid Rx given for pyridium 100 mg TID as needed for pain Return precautions given, patient advised to avoid caffeinated beverages Follow up as scheduled outpatient  Tillman SersAngela C Riccio, DO PGY-1 11/8/201710:40 AM    OB FELLOW MAU DISCHARGE ATTESTATION  I have seen and examined this patient; I agree with above documentation in the resident's note. Patient states the vaginal bleeding occurred when she was urinating for a UA sample in the MAU she noticed some blood in the urine, was not sure if it was coming from the vagina but assumed so. On SSE there was no bleeding in the vagina or posterior vault, nor from the cervix. She was found to have a UTI, urine culture was sent (order was an add-on after discharge), sent home with treatment on macrobid. Contraction like pain improved after IVF boluses.   Jen MowElizabeth Navayah Sok, DO OB Fellow

## 2016-01-21 LAB — GC/CHLAMYDIA PROBE AMP (~~LOC~~) NOT AT ARMC
CHLAMYDIA, DNA PROBE: NEGATIVE
NEISSERIA GONORRHEA: NEGATIVE

## 2016-01-22 LAB — URINE CULTURE

## 2016-01-24 ENCOUNTER — Inpatient Hospital Stay (HOSPITAL_COMMUNITY)
Admission: AD | Admit: 2016-01-24 | Discharge: 2016-01-25 | Disposition: A | Discharge status: Home / Self Care | Payer: Medicaid Other | Source: Ambulatory Visit | Attending: Obstetrics & Gynecology | Admitting: Obstetrics & Gynecology

## 2016-01-24 ENCOUNTER — Encounter (HOSPITAL_COMMUNITY): Payer: Self-pay

## 2016-01-24 ENCOUNTER — Inpatient Hospital Stay (HOSPITAL_COMMUNITY): Payer: Medicaid Other

## 2016-01-24 DIAGNOSIS — Z87891 Personal history of nicotine dependence: Secondary | ICD-10-CM | POA: Insufficient documentation

## 2016-01-24 DIAGNOSIS — O26892 Other specified pregnancy related conditions, second trimester: Secondary | ICD-10-CM | POA: Insufficient documentation

## 2016-01-24 DIAGNOSIS — Z3A24 24 weeks gestation of pregnancy: Secondary | ICD-10-CM | POA: Insufficient documentation

## 2016-01-24 DIAGNOSIS — R109 Unspecified abdominal pain: Secondary | ICD-10-CM | POA: Diagnosis present

## 2016-01-24 DIAGNOSIS — Z880 Allergy status to penicillin: Secondary | ICD-10-CM | POA: Insufficient documentation

## 2016-01-24 DIAGNOSIS — O2342 Unspecified infection of urinary tract in pregnancy, second trimester: Secondary | ICD-10-CM

## 2016-01-24 LAB — URINALYSIS, ROUTINE W REFLEX MICROSCOPIC
BILIRUBIN URINE: NEGATIVE
GLUCOSE, UA: 250 mg/dL — AB
Glucose, UA: 100 mg/dL — AB
KETONES UR: 15 mg/dL — AB
Ketones, ur: 15 mg/dL — AB
NITRITE: POSITIVE — AB
Nitrite: POSITIVE — AB
PROTEIN: 100 mg/dL — AB
Protein, ur: 100 mg/dL — AB
SPECIFIC GRAVITY, URINE: 1.02 (ref 1.005–1.030)
Specific Gravity, Urine: 1.015 (ref 1.005–1.030)
pH: 5.5 (ref 5.0–8.0)
pH: 5.5 (ref 5.0–8.0)

## 2016-01-24 LAB — URINE MICROSCOPIC-ADD ON

## 2016-01-24 MED ORDER — ACETAMINOPHEN 325 MG PO TABS
650.0000 mg | ORAL_TABLET | Freq: Once | ORAL | Status: AC
  Administered 2016-01-25: 650 mg via ORAL
  Filled 2016-01-24: qty 2

## 2016-01-24 NOTE — MAU Provider Note (Signed)
MAU HISTORY AND PHYSICAL  Chief Complaint:  Abdominal Pain   Nancy Francis is a 35 y.o.  G5P4004  at 766w4d presenting for Abdominal Pain . Patient states she has been having  none contractions, none vaginal bleeding, intact membranes, with active fetal movement.    Has been taking antibiotics for UTI past 4 days, also taking pyridium as needed for pain. Still has extreme pain, mostly on right side, that comes and goes. Will start out and gradually worsen. Feels like a lot of pressure and pain can get up to 10/10. Tried heating pad with no relief. Has not tried tylenol. Denies dysuria. Reports continued increased urgency and frequency. Denies fevers, chills, nausea, vomiting.   Past Medical History:  Diagnosis Date  . Abnormal Pap smear 2010   repeat was negative  . Anemia   . Anxiety   . Depression   . PE (pulmonary thromboembolism) (HCC)   . Sickle cell trait [facility](HCC)     Past Surgical History:  Procedure Laterality Date  . COLPOSCOPY    . FRACTURE SURGERY Right    3 surgeries    Family History  Problem Relation Age of Onset  . Asthma Sister   . Diabetes Maternal Aunt     Social History  Substance Use Topics  . Smoking status: Former Smoker    Packs/day: 0.25    Types: Cigarettes    Quit date: 11/04/2015  . Smokeless tobacco: Never Used  . Alcohol use No     Comment: once a week    Allergies  Allergen Reactions  . Penicillins Hives    Has patient had a PCN reaction causing immediate rash, facial/tongue/throat swelling, SOB or lightheadedness with hypotension: Yes Has patient had a PCN reaction causing severe rash involving mucus membranes or skin necrosis: Yes Has patient had a PCN reaction that required hospitalization Yes Has patient had a PCN reaction occurring within the last 10 years: No If all of the above answers are "NO", then may proceed with Cephalosporin use.     Prescriptions Prior to Admission  Medication Sig Dispense Refill Last Dose  .  enoxaparin (LOVENOX) 100 MG/ML injection Inject 0.85 mLs (85 mg total) into the skin every 12 (twelve) hours. 60 Syringe 4 01/24/2016 at Unknown time  . nitrofurantoin, macrocrystal-monohydrate, (MACROBID) 100 MG capsule Take 1 capsule (100 mg total) by mouth 2 (two) times daily. 14 capsule 0 01/24/2016 at Unknown time  . phenazopyridine (PYRIDIUM) 100 MG tablet Take 1 tablet (100 mg total) by mouth 3 (three) times daily as needed for pain. 10 tablet 0 01/24/2016 at Unknown time  . promethazine (PHENERGAN) 25 MG tablet Take 1 tablet (25 mg total) by mouth every 6 (six) hours as needed for nausea or vomiting. 30 tablet 1 Past Month at Unknown time  . sertraline (ZOLOFT) 50 MG tablet Take 1 tablet (50 mg total) by mouth daily. 30 tablet 3 01/24/2016 at Unknown time    Review of Systems - Negative except for what is mentioned in HPI.  Physical Exam  Blood pressure 125/66, pulse 87, temperature 98.5 F (36.9 C), temperature source Oral, resp. rate 18, last menstrual period 08/05/2015, SpO2 100 %. GENERAL: Well-developed, well-nourished female in no acute distress.  LUNGS: No respiratory distress HEART: Regular rate ABDOMEN: Soft, nontender, nondistended, gravid abdomen. Suprapubic tenderness EXTREMITIES: Nontender, no edema, 2+ distal pulses. FHT:  Baseline 155, moderate variability, accelerations present, no decelerations Contractions: none Dilation: Closed Effacement (%): Thick Cervical Position: Posterior Exam by:: Dr Wonda Oldsiccio  Labs: Results for orders placed or performed during the hospital encounter of 01/24/16 (from the past 24 hour(s))  Urinalysis, Routine w reflex microscopic (not at [facility])   Collection Time: 01/24/16 10:44 PM  Result Value Ref Range   Color, Urine ORANGE (A) YELLOW   APPearance CLEAR CLEAR   Specific Gravity, Urine 1.015 1.005 - 1.030   pH 5.5 5.0 - 8.0   Glucose, UA 100 (A) NEGATIVE mg/dL   Hgb urine dipstick LARGE (A) NEGATIVE   Bilirubin Urine NEGATIVE  NEGATIVE   Ketones, ur 15 (A) NEGATIVE mg/dL   Protein, ur 161100 (A) NEGATIVE mg/dL   Nitrite POSITIVE (A) NEGATIVE   Leukocytes, UA TRACE (A) NEGATIVE  Urine microscopic-add on   Collection Time: 01/24/16 10:44 PM  Result Value Ref Range   Squamous Epithelial / LPF 0-5 (A) NONE SEEN   WBC, UA 0-5 0 - 5 WBC/hpf   RBC / HPF 6-30 0 - 5 RBC/hpf   Bacteria, UA MANY (A) NONE SEEN   Urine-Other MUCOUS PRESENT   Urinalysis, Routine w reflex microscopic (not at [facility])   Collection Time: 01/24/16 11:30 PM  Result Value Ref Range   Color, Urine ORANGE (A) YELLOW   APPearance CLEAR CLEAR   Specific Gravity, Urine 1.020 1.005 - 1.030   pH 5.5 5.0 - 8.0   Glucose, UA 250 (A) NEGATIVE mg/dL   Hgb urine dipstick LARGE (A) NEGATIVE   Bilirubin Urine SMALL (A) NEGATIVE   Ketones, ur 15 (A) NEGATIVE mg/dL   Protein, ur 096100 (A) NEGATIVE mg/dL   Nitrite POSITIVE (A) NEGATIVE   Leukocytes, UA TRACE (A) NEGATIVE  Urine microscopic-add on   Collection Time: 01/24/16 11:30 PM  Result Value Ref Range   Squamous Epithelial / LPF 0-5 (A) NONE SEEN   WBC, UA 0-5 0 - 5 WBC/hpf   RBC / HPF 6-30 0 - 5 RBC/hpf   Bacteria, UA FEW (A) NONE SEEN   Urine-Other MUCOUS PRESENT     Imaging Studies:  [place] Renal  Result Date: 01/25/2016 CLINICAL DATA:  Acute onset of right-sided abdominal pain. Initial encounter. EXAM: RENAL / URINARY TRACT ULTRASOUND COMPLETE COMPARISON:  None. FINDINGS: Right Kidney: Length: 12.6 cm. Echogenicity within normal limits. Minimal right-sided hydronephrosis may reflect physiologic hydronephrosis of pregnancy. No mass visualized. Left Kidney: Length: 12.6 cm. Echogenicity within normal limits. No mass or hydronephrosis visualized. Bladder: Appears normal for degree of bladder distention. IMPRESSION: Minimal right-sided hydronephrosis may reflect physiologic hydronephrosis of pregnancy. Electronically Signed   By: Roanna RaiderJeffery  Chang M.D.   On: 01/25/2016 00:20   [place] Mfm Ob Follow Up  Result  Date: 01/11/2016 OBSTETRICAL ULTRASOUND: This exam was performed within a [facility]. The OB US report was generated in the AS system, and faxed to the ordering physician.  This report is available in the [employer] PACS. See the AS Obstetric US report via the Image Link.   Assessment: Nancy Francis is  35 y.o. G5P4004 at 121w4d presents with Abdominal Pain . MDM Renal US UA, urine culture  Plan:  UTI Changed antibiotics to Keflex, 10 days Urine sent for culture Increase water intake Tylenol for pain Return precautions given Patient verbalized understanding and agreement with plan  Tillman SersAngela C Riccio, DO PGY-1 11/13/201712:39 AM

## 2016-01-24 NOTE — MAU Note (Signed)
Pt c/o lower abdominal pain and lots of pressure. Rates 10/10. Seen a few days ago-treated for UTI. States she last took antibiotic and pyridium today. Denies vag bleeding or LOF. +FM

## 2016-01-25 MED ORDER — CEPHALEXIN 500 MG PO CAPS: 500.0000 mg | ORAL_CAPSULE | Freq: Two times a day (BID) | ORAL | 0 refills | Status: AC

## 2016-01-25 NOTE — Discharge Instructions (Signed)

## 2016-01-26 LAB — URINE CULTURE

## 2016-02-03 ENCOUNTER — Encounter (HOSPITAL_COMMUNITY): Payer: Self-pay | Admitting: *Deleted

## 2016-02-03 ENCOUNTER — Inpatient Hospital Stay (HOSPITAL_COMMUNITY)
Admission: EM | Admit: 2016-02-03 | Discharge: 2016-02-04 | Disposition: A | Discharge status: Other Acute Inpatient Hospital | Payer: Medicaid Other | Attending: Obstetrics and Gynecology | Admitting: Obstetrics and Gynecology

## 2016-02-03 DIAGNOSIS — R1030 Lower abdominal pain, unspecified: Secondary | ICD-10-CM

## 2016-02-03 DIAGNOSIS — N390 Urinary tract infection, site not specified: Secondary | ICD-10-CM

## 2016-02-03 DIAGNOSIS — Z87891 Personal history of nicotine dependence: Secondary | ICD-10-CM | POA: Insufficient documentation

## 2016-02-03 DIAGNOSIS — Z3A25 25 weeks gestation of pregnancy: Secondary | ICD-10-CM | POA: Diagnosis not present

## 2016-02-03 DIAGNOSIS — O2342 Unspecified infection of urinary tract in pregnancy, second trimester: Secondary | ICD-10-CM | POA: Insufficient documentation

## 2016-02-03 DIAGNOSIS — O26892 Other specified pregnancy related conditions, second trimester: Secondary | ICD-10-CM | POA: Diagnosis present

## 2016-02-03 LAB — COMPREHENSIVE METABOLIC PANEL
ALT: 10 U/L — AB (ref 14–54)
AST: 12 U/L — AB (ref 15–41)
Albumin: 3.4 g/dL — ABNORMAL LOW (ref 3.5–5.0)
Alkaline Phosphatase: 87 U/L (ref 38–126)
Anion gap: 10 (ref 5–15)
BILIRUBIN TOTAL: 0.3 mg/dL (ref 0.3–1.2)
CALCIUM: 9.8 mg/dL (ref 8.9–10.3)
CO2: 20 mmol/L — ABNORMAL LOW (ref 22–32)
CREATININE: 0.58 mg/dL (ref 0.44–1.00)
Chloride: 106 mmol/L (ref 101–111)
GFR calc Af Amer: >60 mL/min (ref >60)
Glucose, Bld: 84 mg/dL (ref 65–99)
Potassium: 4.6 mmol/L (ref 3.5–5.1)
Sodium: 136 mmol/L (ref 135–145)
TOTAL PROTEIN: 6.9 g/dL (ref 6.5–8.1)

## 2016-02-03 LAB — URINALYSIS, ROUTINE W REFLEX MICROSCOPIC
Bilirubin Urine: NEGATIVE
GLUCOSE, UA: NEGATIVE mg/dL
KETONES UR: NEGATIVE mg/dL
NITRITE: NEGATIVE
PROTEIN: NEGATIVE mg/dL
Specific Gravity, Urine: 1.023 (ref 1.005–1.030)
pH: 6 (ref 5.0–8.0)

## 2016-02-03 LAB — CBC
HCT: 33.1 % — ABNORMAL LOW (ref 36.0–46.0)
Hemoglobin: 10.7 g/dL — ABNORMAL LOW (ref 12.0–15.0)
MCH: 29.6 pg (ref 26.0–34.0)
MCHC: 32.3 g/dL (ref 30.0–36.0)
MCV: 91.4 fL (ref 78.0–100.0)
PLATELETS: 251 10*3/uL (ref 150–400)
RBC: 3.62 MIL/uL — ABNORMAL LOW (ref 3.87–5.11)
RDW: 14.6 % (ref 11.5–15.5)
WBC: 8.6 10*3/uL (ref 4.0–10.5)

## 2016-02-03 LAB — URINE MICROSCOPIC-ADD ON

## 2016-02-03 LAB — LIPASE, BLOOD: Lipase: 42 U/L (ref 11–51)

## 2016-02-03 MED ORDER — DEXTROSE 5 % IV SOLN
1.0000 g | Freq: Once | INTRAVENOUS | Status: AC
  Administered 2016-02-03: 1 g via INTRAVENOUS
  Filled 2016-02-03: qty 10

## 2016-02-03 MED ORDER — SODIUM CHLORIDE 0.9 % IV BOLUS (SEPSIS)
1000.0000 mL | Freq: Once | INTRAVENOUS | Status: AC
  Administered 2016-02-03: 1000 mL via INTRAVENOUS

## 2016-02-03 MED ORDER — MORPHINE SULFATE (PF) 4 MG/ML IV SOLN
2.0000 mg | Freq: Once | INTRAVENOUS | Status: AC
  Administered 2016-02-03: 2 mg via INTRAVENOUS
  Filled 2016-02-03: qty 1

## 2016-02-03 NOTE — ED Notes (Addendum)
Pt 24 weeks. Rapid OB contacted. Lynden AngCathy will come down. Pain in vaginal area, mostly without urination at with exertion.

## 2016-02-03 NOTE — ED Notes (Signed)
OB Rapid Response at bedside.   

## 2016-02-03 NOTE — ED Triage Notes (Signed)
The -pt reports that she has had a uti for one month she has been on antibiotics and is currently on kefles  In the past few days she has had flank pain and she has pain each time she urinates with blood in her urine  She is 6 months pregnant this is her 5thchild

## 2016-02-03 NOTE — ED Provider Notes (Signed)
[facility]-EMERGENCY DEPT Provider Note   CSN: [id number] Arrival date & time: 02/03/16  2009     History   Chief Complaint Chief Complaint  Patient presents with  . Abdominal Pain  . Recurrent UTI    HPI Eileen StanfordDominique Zhong is a 35 y.o. female.  HPI 3 weeks ago developed symptoms Was having lower abdominal pain, urinary frequency, pain with urination, urinating constantly, feels need to push with urination Diagnosed 11/8 with urinary tract inrfection, started on macrobid, 11/12 started on keflex, almost done with medicine and it is not working Stabbing pain, comes and goes, better when laying back. No vaginal bleeding or discharge that is changed from baseline No fever Constipation, small BM today, no normal BM for 3 days.   Past Medical History:  Diagnosis Date  . Abnormal Pap smear 2010   repeat was negative  . Anemia   . Anxiety   . Depression   . PE (pulmonary thromboembolism) (HCC)   . Sickle cell trait [facility](HCC)     Patient Active Problem List   Diagnosis Date Noted  . Adjustment disorder with depressed mood 01/16/2016  . Abnormal maternal serum screening test 11/23/2015  . Supervision of high risk pregnancy, antepartum 10/29/2015  . Hx Pulmonary embolism, currently pregnant, antepartum 10/29/2015  . DVT of upper extremity (deep vein thrombosis) (HCC) 10/29/2015  . Bilateral pulmonary embolism (HCC) 12/06/2012  . Acute dyspnea 12/06/2012  . Chest pain 12/06/2012  . SVD (spontaneous vaginal delivery) 08/14 12/06/2012  . Sickle cell trait [facility](HCC)     Past Surgical History:  Procedure Laterality Date  . COLPOSCOPY    . FRACTURE SURGERY Right    3 surgeries    OB History    Gravida Para Term Preterm AB Living   5 4 4  0 0 4   SAB TAB Ectopic Multiple Live Births   0 0 0 0 2       Home Medications    Prior to Admission medications   Medication Sig Start Date End Date Taking? Authorizing Provider  cephALEXin (KEFLEX) 500 MG capsule Take 1 capsule (500 mg  total) by mouth 2 (two) times daily. Take for 7 days 01/25/16 02/04/16  Tillman SersAngela C Riccio, DO  enoxaparin (LOVENOX) 100 MG/ML injection Inject 0.85 mLs (85 mg total) into the skin every 12 (twelve) hours. 11/20/15   Levie HeritageJacob J Quevedo, DO  phenazopyridine (PYRIDIUM) 100 MG tablet Take 1 tablet (100 mg total) by mouth 3 (three) times daily as needed for pain. 01/20/16   Tillman SersAngela C Riccio, DO  promethazine (PHENERGAN) 25 MG tablet Take 1 tablet (25 mg total) by mouth every 6 (six) hours as needed for nausea or vomiting. 10/18/15   Dorathy KinsmanVirginia Smith, CNM  sertraline (ZOLOFT) 50 MG tablet Take 1 tablet (50 mg total) by mouth daily. 11/20/15   Levie HeritageJacob J Drinkard, DO    Family History Family History  Problem Relation Age of Onset  . Asthma Sister   . Diabetes Maternal Aunt     Social History Social History  Substance Use Topics  . Smoking status: Former Smoker    Packs/day: 0.25    Types: Cigarettes    Quit date: 11/04/2015  . Smokeless tobacco: Never Used  . Alcohol use No     Comment: once a week     Allergies   Penicillins   Review of Systems Review of Systems  Constitutional: Negative for fever.  HENT: Negative for sore throat.   Eyes: Negative for visual disturbance.  Respiratory: Negative for  cough and shortness of breath.   Cardiovascular: Negative for chest pain.  Gastrointestinal: Positive for abdominal pain, constipation and nausea. Negative for diarrhea and vomiting.  Genitourinary: Positive for pelvic pain and vaginal discharge (reports present through whole pregnancy). Negative for difficulty urinating and vaginal bleeding.  Musculoskeletal: Negative for back pain and neck pain.  Skin: Negative for rash.  Neurological: Negative for syncope and headaches.     Physical Exam Updated Vital Signs BP 119/63 (BP Location: Left Arm)   Pulse 98   Temp 97.8 F (36.6 C) (Oral)   Resp 20   Ht 5\' 4"  (1.626 m)   Wt 190 lb (86.2 kg)   LMP 08/05/2015 (LMP Unknown)   SpO2 100%   BMI 32.61  kg/m   Physical Exam  Constitutional: She is oriented to person, place, and time. She appears well-developed and well-nourished. She appears distressed (appears uncomfortable intermittently).  HENT:  Head: Normocephalic and atraumatic.  Eyes: Conjunctivae and EOM are normal.  Neck: Normal range of motion.  Cardiovascular: Normal rate, regular rhythm, normal heart sounds and intact distal pulses.  Exam reveals no gallop and no friction rub.   No murmur heard. Pulmonary/Chest: Effort normal and breath sounds normal. No respiratory distress. She has no wheezes. She has no rales.  Abdominal: Soft. She exhibits no distension. There is no tenderness. There is no guarding.  Gravid   Musculoskeletal: She exhibits no edema or tenderness.  Neurological: She is alert and oriented to person, place, and time.  Skin: Skin is warm and dry. No rash noted. She is not diaphoretic. No erythema.  Nursing note and vitals reviewed.    ED Treatments / Results  Labs (all labs ordered are listed, but only abnormal results are displayed) Labs Reviewed  COMPREHENSIVE METABOLIC PANEL - Abnormal; Notable for the following:       Result Value   CO2 20 (*)    BUN <5 (*)    Albumin 3.4 (*)    AST 12 (*)    ALT 10 (*)    All other components within normal limits  CBC - Abnormal; Notable for the following:    RBC 3.62 (*)    Hemoglobin 10.7 (*)    HCT 33.1 (*)    All other components within normal limits  URINALYSIS, ROUTINE W REFLEX MICROSCOPIC (NOT AT [facility]) - Abnormal; Notable for the following:    Hgb urine dipstick MODERATE (*)    Leukocytes, UA SMALL (*)    All other components within normal limits  URINE MICROSCOPIC-ADD ON - Abnormal; Notable for the following:    Squamous Epithelial / LPF 0-5 (*)    Bacteria, UA RARE (*)    All other components within normal limits  URINE CULTURE  LIPASE, BLOOD    EKG  EKG Interpretation None       Radiology No results found.  Procedures Procedures  (including critical care time)  Medications Ordered in ED Medications  cefTRIAXone (ROCEPHIN) 1 g in dextrose 5 % 50 mL IVPB (0 g Intravenous Stopped 02/04/16 0037)  sodium chloride 0.9 % bolus 1,000 mL (0 mLs Intravenous Stopped 02/04/16 0110)  morphine 4 MG/ML injection 2 mg (2 mg Intravenous Given 02/03/16 2358)     Initial Impression / Assessment and Plan / ED Course  I have reviewed the triage vital signs and the nursing notes.  Pertinent labs & imaging results that were available during my care of the patient were reviewed by me and considered in my medical decision making (  see chart for details).  Clinical Course    35 year old with a history of PE, adjustment disorder G5 P4 004 at approx [redacted] weeks gestation presents with concern for abdominal pain. Patient has had recent evaluations in the MAU for the same. Have low suspicion for appendicitis, diverticulitis, or cholecystitis given duration of symptoms as well as exam. Prior urine cultures have shown multiple species, however given patient's dysuria and urinalysis today, we'll treat for UTI with Rocephin. OB rapid response at bedside on patient's arrival, and report patient is not having contractions, and has normal fetal monitoring at this time.  OB rapid discussed with OB on call Dr. Vergie LivingPickens, who recommends [facility]. Given pain described by patient, feel further evaluation and observation is appropriate. We'll defer pelvic and cervical exam to obstetric providers. Pt transferred in stable condition.  Final Clinical Impressions(s) / ED Diagnoses   Final diagnoses:  Lower abdominal pain  [redacted] weeks gestation of pregnancy  Urinary tract infection without hematuria, site unspecified    New Prescriptions Current Discharge Medication List       Alvira MondayErin Elzena Muston, MD 02/04/16 0236

## 2016-02-04 ENCOUNTER — Encounter (HOSPITAL_COMMUNITY): Payer: Self-pay | Admitting: *Deleted

## 2016-02-04 DIAGNOSIS — R1030 Lower abdominal pain, unspecified: Secondary | ICD-10-CM | POA: Diagnosis not present

## 2016-02-04 MED ORDER — PHENAZOPYRIDINE HCL 100 MG PO TABS: 100.0000 mg | ORAL_TABLET | Freq: Three times a day (TID) | ORAL | 0 refills | Status: DC | PRN

## 2016-02-04 MED ORDER — PHENAZOPYRIDINE HCL 100 MG PO TABS
200.0000 mg | ORAL_TABLET | Freq: Three times a day (TID) | ORAL | Status: AC
  Administered 2016-02-04: 200 mg via ORAL
  Filled 2016-02-04 (×2): qty 2

## 2016-02-04 MED ORDER — CYCLOBENZAPRINE HCL 10 MG PO TABS: 10.0000 mg | ORAL_TABLET | Freq: Three times a day (TID) | ORAL | 1 refills | Status: DC | PRN

## 2016-02-04 MED ORDER — CYCLOBENZAPRINE HCL 10 MG PO TABS
10.0000 mg | ORAL_TABLET | Freq: Once | ORAL | Status: AC
  Administered 2016-02-04: 10 mg via ORAL
  Filled 2016-02-04: qty 1

## 2016-02-04 NOTE — ED Notes (Signed)
Paged [facility] for transport

## 2016-02-04 NOTE — MAU Provider Note (Signed)
History     CSN: [id number]  Arrival date and time: 02/03/16 2009   None     Chief Complaint  Patient presents with  . Abdominal Pain  . Recurrent UTI   Abdominal Pain  This is a recurrent problem. The current episode started yesterday. The onset quality is sudden. The problem occurs intermittently. The problem has been unchanged. The pain is located in the suprapubic region. The pain is at a severity of 10/10. The pain is severe. Quality: She felt a pop and then a stabbing pain at 1800 tonight.  The abdominal pain does not radiate. Associated symptoms include frequency. Pertinent negatives include no anorexia, arthralgias, belching, constipation, diarrhea, dysuria, fever, flatus, headaches, hematochezia, hematuria, melena, myalgias, nausea, vomiting or weight loss.   T OB History    Gravida Para Term Preterm AB Living   5 4 4  0 0 4   SAB TAB Ectopic Multiple Live Births   0 0 0 0 2      Past Medical History:  Diagnosis Date  . Abnormal Pap smear 2010   repeat was negative  . Anemia   . Anxiety   . Depression   . PE (pulmonary thromboembolism) (HCC)   . Sickle cell trait [facility](HCC)     Past Surgical History:  Procedure Laterality Date  . COLPOSCOPY    . FRACTURE SURGERY Right    3 surgeries    Family History  Problem Relation Age of Onset  . Asthma Sister   . Diabetes Maternal Aunt     Social History  Substance Use Topics  . Smoking status: Former Smoker    Packs/day: 0.25    Types: Cigarettes    Quit date: 11/04/2015  . Smokeless tobacco: Never Used  . Alcohol use No     Comment: once a week    Allergies:  Allergies  Allergen Reactions  . Penicillins Hives    Has patient had a PCN reaction causing immediate rash, facial/tongue/throat swelling, SOB or lightheadedness with hypotension: Yes Has patient had a PCN reaction causing severe rash involving mucus membranes or skin necrosis: Yes Has patient had a PCN reaction that required hospitalization Yes Has  patient had a PCN reaction occurring within the last 10 years: No If all of the above answers are "NO", then may proceed with Cephalosporin use.     Prescriptions Prior to Admission  Medication Sig Dispense Refill Last Dose  . cephALEXin (KEFLEX) 500 MG capsule Take 1 capsule (500 mg total) by mouth 2 (two) times daily. Take for 7 days 20 capsule 0   . enoxaparin (LOVENOX) 100 MG/ML injection Inject 0.85 mLs (85 mg total) into the skin every 12 (twelve) hours. 60 Syringe 4 01/24/2016 at Unknown time  . phenazopyridine (PYRIDIUM) 100 MG tablet Take 1 tablet (100 mg total) by mouth 3 (three) times daily as needed for pain. 10 tablet 0 01/24/2016 at Unknown time  . promethazine (PHENERGAN) 25 MG tablet Take 1 tablet (25 mg total) by mouth every 6 (six) hours as needed for nausea or vomiting. 30 tablet 1 Past Month at Unknown time  . sertraline (ZOLOFT) 50 MG tablet Take 1 tablet (50 mg total) by mouth daily. 30 tablet 3 01/24/2016 at Unknown time    Review of Systems  Constitutional: Negative for fever and weight loss.  HENT: Negative.   Eyes: Negative.   Respiratory: Negative.   Cardiovascular: Negative.   Gastrointestinal: Positive for abdominal pain. Negative for anorexia, constipation, diarrhea, flatus, hematochezia, melena, nausea and  vomiting.  Genitourinary: Positive for frequency. Negative for dysuria and hematuria.  Musculoskeletal: Negative for arthralgias and myalgias.  Skin: Negative.   Neurological: Negative for headaches.   Physical Exam   Blood pressure 119/63, pulse 98, temperature 97.8 F (36.6 C), temperature source Oral, resp. rate 20, height 5\' 4"  (1.626 m), weight 190 lb (86.2 kg), last menstrual period 08/05/2015, SpO2 100 %.  Physical Exam  Constitutional: She is oriented to person, place, and time. She appears well-developed and well-nourished.  HENT:  Head: Normocephalic.  Respiratory: Effort normal.  GI: Soft. She exhibits no distension and no mass. There is  tenderness. There is no rebound and no guarding.  Genitourinary: Vagina normal.  Musculoskeletal: Normal range of motion.  Neurological: She is alert and oriented to person, place, and time.  Psychiatric: She has a normal mood and affect.     MAU Course  Procedures  MDM -Flexeril and pyridium for pain -[facility], no elevated WBC -[facility]  Assessment and Plan  The [facility] after she felt a stabbing pain in her urethra tonight and then was transferred here. She has been seen in the MAU for recurrent UTIs. Last visit was on 11/12 when she was switched from macrobid to Keflex. She is continuing her course of Keflex and is still taking her lovenox.    Fetus is very active, denies bleeding or leaking of fluid. FHR is 145 with moderate variability; positive accelerations and no decelerations.   Patient stable for discharge after receiving pyridium and flexeril. Counseling patient on importance of finishing her antibiotic, continuing with her lovenox and taking her pyridium. Recommended that patient purchase a belly binder to see if that helps alleviate some of her pelvic pain. Patient verbalized understanding.     Charlesetta GaribaldiKathryn Lorraine Breshae Belcher CNM 02/04/2016, 2:50 AM

## 2016-02-04 NOTE — MAU Note (Signed)
Patient arrives via ems as transfer [facility] for additional evaluation.

## 2016-02-04 NOTE — ED Notes (Signed)
Wasted 2mg  of Morphine in sharps with Munsten Elliot Gurney( Woody (RN) post transfer

## 2016-02-04 NOTE — Discharge Instructions (Signed)
Pregnancy and Urinary Tract Infection °WHAT IS A URINARY TRACT INFECTION? °A urinary tract infection (UTI) is an infection of any part of the urinary tract. This includes the kidneys, the tubes that connect your kidneys to your bladder (ureters), the bladder, and the tube that carries urine out of your body (urethra). These organs make, store, and get rid of urine in the body. A UTI can be a bladder infection (cystitis) or a kidney infection (pyelonephritis). This infection may be caused by fungi, viruses, and bacteria. Bacteria are the most common cause of UTIs. °You are more likely to develop a UTI during pregnancy because: °· The physical and hormonal changes your body goes through can make it easier for bacteria to get into your urinary tract. °· Your growing baby puts pressure on your uterus and can affect urine flow. °DOES A UTI PLACE MY BABY AT RISK? °An untreated UTI during pregnancy could lead to a kidney infection, which can cause health problems that could affect your baby. Possible complications of an untreated UTI include: °· Having your baby before 37 weeks of pregnancy (premature). °· Having a baby with a low birth weight. °· Developing high blood pressure during pregnancy (preeclampsia). °WHAT ARE THE SYMPTOMS OF A UTI? °Symptoms of a UTI include: °· Fever. °· Frequent urination or passing small amounts of urine frequently. °· Needing to urinate urgently. °· Pain or a burning sensation with urination. °· Urine that smells bad or unusual. °· Cloudy urine. °· Pain in the lower abdomen or back. °· Trouble urinating. °· Blood in the urine. °· Vomiting or being less hungry than normal. °· Diarrhea or abdominal pain. °· Vaginal discharge. °WHAT ARE THE TREATMENT OPTIONS FOR A UTI DURING PREGNANCY? °Treatment for this condition may include: °· Antibiotic medicines that are safe to take during pregnancy. °· Other medicines to treat less common causes of UTI. °HOW CAN I PREVENT A UTI? °To prevent a UTI: °· Go  to the bathroom as soon as you feel the need. °· Always wipe from front to back. °· Wash your genital area with soap and warm water daily. °· Empty your bladder before and after sex. °· Wear cotton underwear. °· Limit your intake of high sugar foods or drinks, such as regular soda, juice, and sweets.. °· Drink 6-8 glasses of water daily. °· Do not wear tight-fitting pants. °· Do not douche or use deodorant sprays. °· Do not drink alcohol, caffeine, or carbonated drinks. These can irritate the bladder. °WHEN SHOULD I SEEK MEDICAL CARE? °Seek medical care if: °· Your symptoms do not improve or get worse. °· You have a fever after two days of treatment. °· You have a rash. °· You have abnormal vaginal discharge. °· You have back or side pain. °· You have chills. °· You have nausea and vomiting. °WHEN SHOULD I SEEK IMMEDIATE MEDICAL CARE? °Seek immediate medical care if you are pregnant and: °· You feel contractions in your uterus. °· You have lower belly pain. °· You have a gush of fluid from your vagina. °· You have blood in your urine. °· You are vomiting and cannot keep down any medicines or water. °This information is not intended to replace advice given to you by your health care provider. Make sure you discuss any questions you have with your health care provider. °Document Released: 06/25/2010 Document Revised: 08/03/2015 Document Reviewed: 01/19/2015 °Elsevier Interactive Patient Education © 2017 [employer] ° °

## 2016-02-05 LAB — URINE CULTURE: CULTURE: NO GROWTH

## 2016-02-09 ENCOUNTER — Ambulatory Visit (HOSPITAL_COMMUNITY)
Admission: RE | Admit: 2016-02-09 | Discharge: 2016-02-09 | Disposition: A | Discharge status: Home / Self Care | Payer: Medicaid Other | Source: Ambulatory Visit | Attending: Family Medicine | Admitting: Family Medicine

## 2016-02-11 ENCOUNTER — Ambulatory Visit (HOSPITAL_COMMUNITY)
Admission: RE | Admit: 2016-02-11 | Discharge: 2016-02-11 | Disposition: A | Discharge status: Home / Self Care | Payer: Medicaid Other | Source: Ambulatory Visit | Attending: Family Medicine | Admitting: Family Medicine

## 2016-02-11 ENCOUNTER — Other Ambulatory Visit (HOSPITAL_COMMUNITY): Payer: Self-pay | Admitting: Maternal and Fetal Medicine

## 2016-02-11 ENCOUNTER — Encounter (HOSPITAL_COMMUNITY): Payer: Self-pay

## 2016-02-11 DIAGNOSIS — O88212 Thromboembolism in pregnancy, second trimester: Secondary | ICD-10-CM

## 2016-02-11 DIAGNOSIS — Z862 Personal history of diseases of the blood and blood-forming organs and certain disorders involving the immune mechanism: Secondary | ICD-10-CM

## 2016-02-11 DIAGNOSIS — O2230 Deep phlebothrombosis in pregnancy, unspecified trimester: Secondary | ICD-10-CM

## 2016-02-11 DIAGNOSIS — O99212 Obesity complicating pregnancy, second trimester: Secondary | ICD-10-CM

## 2016-02-11 DIAGNOSIS — Z8759 Personal history of other complications of pregnancy, childbirth and the puerperium: Secondary | ICD-10-CM

## 2016-02-11 DIAGNOSIS — O88219 Thromboembolism in pregnancy, unspecified trimester: Secondary | ICD-10-CM

## 2016-02-11 DIAGNOSIS — Z3A27 27 weeks gestation of pregnancy: Secondary | ICD-10-CM

## 2016-02-11 DIAGNOSIS — O280 Abnormal hematological finding on antenatal screening of mother: Secondary | ICD-10-CM

## 2016-02-11 DIAGNOSIS — O281 Abnormal biochemical finding on antenatal screening of mother: Secondary | ICD-10-CM

## 2016-02-11 DIAGNOSIS — Z86718 Personal history of other venous thrombosis and embolism: Secondary | ICD-10-CM

## 2016-02-11 DIAGNOSIS — O2232 Deep phlebothrombosis in pregnancy, second trimester: Secondary | ICD-10-CM | POA: Insufficient documentation

## 2016-02-11 DIAGNOSIS — O0990 Supervision of high risk pregnancy, unspecified, unspecified trimester: Secondary | ICD-10-CM

## 2016-02-12 ENCOUNTER — Other Ambulatory Visit (HOSPITAL_COMMUNITY): Payer: Self-pay | Admitting: *Deleted

## 2016-02-15 ENCOUNTER — Other Ambulatory Visit (HOSPITAL_COMMUNITY): Payer: Self-pay | Admitting: *Deleted

## 2016-02-15 DIAGNOSIS — O09899 Supervision of other high risk pregnancies, unspecified trimester: Secondary | ICD-10-CM

## 2016-02-15 DIAGNOSIS — O280 Abnormal hematological finding on antenatal screening of mother: Principal | ICD-10-CM

## 2016-02-16 ENCOUNTER — Ambulatory Visit (INDEPENDENT_AMBULATORY_CARE_PROVIDER_SITE_OTHER): Payer: Medicaid Other | Admitting: Obstetrics and Gynecology

## 2016-02-16 VITALS — BP 113/52 | HR 96 | Wt 189.0 lb

## 2016-02-16 DIAGNOSIS — O88219 Thromboembolism in pregnancy, unspecified trimester: Secondary | ICD-10-CM

## 2016-02-16 DIAGNOSIS — O88212 Thromboembolism in pregnancy, second trimester: Secondary | ICD-10-CM

## 2016-02-16 DIAGNOSIS — O0990 Supervision of high risk pregnancy, unspecified, unspecified trimester: Secondary | ICD-10-CM

## 2016-02-16 DIAGNOSIS — O280 Abnormal hematological finding on antenatal screening of mother: Secondary | ICD-10-CM

## 2016-02-16 DIAGNOSIS — O0972 Supervision of high risk pregnancy due to social problems, second trimester: Secondary | ICD-10-CM

## 2016-02-16 DIAGNOSIS — I82A29 Chronic embolism and thrombosis of unspecified axillary vein: Secondary | ICD-10-CM

## 2016-02-16 LAB — CBC
HCT: 29.4 % — ABNORMAL LOW (ref 35.0–45.0)
Hemoglobin: 9.7 g/dL — ABNORMAL LOW (ref 11.7–15.5)
MCH: 29.9 pg (ref 27.0–33.0)
MCHC: 33 g/dL (ref 32.0–36.0)
MCV: 90.7 fL (ref 80.0–100.0)
MPV: 10.2 fL (ref 7.5–12.5)
PLATELETS: 226 10*3/uL (ref 140–400)
RBC: 3.24 MIL/uL — AB (ref 3.80–5.10)
RDW: 15.7 % — AB (ref 11.0–15.0)
WBC: 6.5 10*3/uL (ref 3.8–10.8)

## 2016-02-16 MED ORDER — TETANUS-DIPHTH-ACELL PERTUSSIS 5-2.5-18.5 LF-MCG/0.5 IM SUSP
0.5000 mL | Freq: Once | INTRAMUSCULAR | Status: AC
  Administered 2016-02-16: 0.5 mL via INTRAMUSCULAR

## 2016-02-16 NOTE — Progress Notes (Signed)
Pt is fasting for greater than 12 hours today.

## 2016-02-16 NOTE — Progress Notes (Signed)
   PRENATAL VISIT NOTE  Subjective:  Nancy Francis is a 35 y.o. G5P4004 at 4934w6d being seen today for ongoing prenatal care.  She is currently monitored for the following issues for this high-risk pregnancy and has Sickle cell trait (HCC); Bilateral pulmonary embolism (HCC); Acute dyspnea; Chest pain; SVD (spontaneous vaginal delivery) 08/14; Supervision of high risk pregnancy, antepartum; Hx Pulmonary embolism, currently pregnant, antepartum; DVT of upper extremity (deep vein thrombosis) (HCC); Abnormal maternal serum screening test; and Adjustment disorder with depressed mood on her problem list.  Patient reports no complaints.  Contractions: Not present. Vag. Bleeding: None.  Movement: Present. Denies leaking of fluid.   The following portions of the patient's history were reviewed and updated as appropriate: allergies, current medications, past family history, past medical history, past social history, past surgical history and problem list. Problem list updated.  Objective:   Vitals:   02/16/16 1231  BP: (!) 113/52  Pulse: 96  Weight: 85.7 kg (189 lb)    Fetal Status: Fetal Heart Rate (bpm): 150 Fundal Height: 28 cm Movement: Present     General:  Alert, oriented and cooperative. Patient is in no acute distress.  Skin: Skin is warm and dry. No rash noted.   Cardiovascular: Normal heart rate noted  Respiratory: Normal respiratory effort, no problems with respiration noted  Abdomen: Soft, gravid, appropriate for gestational age. Pain/Pressure: Present     Pelvic:  Cervical exam deferred        Extremities: Normal range of motion.  Edema: Trace  Mental Status: Normal mood and affect. Normal behavior. Normal judgment and thought content.   Assessment and Plan:  Pregnancy: G5P4004 at 5734w6d  1. Supervision of high risk pregnancy due to social problems in second trimester Patient is doing well without complaints Third trimester labs, glucola and Tdap today Patient is interested  in BTL- consent signed today - CBC - RPR - HIV antibody (with reflex) - Tdap (BOOSTRIX) injection 0.5 mL; Inject 0.5 mLs into the muscle once. - 2Hr GTT w/ 1 Hr Carpenter 75 g  2. Supervision of high risk pregnancy, antepartum  - 2Hr GTT w/ 1 Hr Carpenter 75 g  3. Abnormal maternal serum screening test Normal NIPS - 2Hr GTT w/ 1 Hr Carpenter 75 g  4. Hx Pulmonary embolism, currently pregnant, antepartum Continue daily lovenox Discussed plan for IOL at 39 weeks to allow for epidural. Also discussed other methods of analgesia during labor (IV and NO) - 2Hr GTT w/ 1 Hr Carpenter 75 g  5. 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 Please refer to After Visit Summary for other counseling recommendations.  Return in about 2 weeks (around 03/01/2016).   Catalina AntiguaPeggy Dominic Rhome, MD

## 2016-02-17 LAB — RPR

## 2016-02-17 LAB — HIV ANTIBODY (ROUTINE TESTING W REFLEX): HIV 1&2 Ab, 4th Generation: NONREACTIVE

## 2016-02-17 LAB — 2HR GTT W 1 HR, CARPENTER, 75 G
GLUCOSE, 1 HR, GEST: 107 mg/dL (ref <180)
Glucose, 2 Hr, Gest: 84 mg/dL (ref <153)
Glucose, Fasting, Gest: 73 mg/dL (ref 65–91)

## 2016-02-18 ENCOUNTER — Encounter: Payer: Self-pay | Admitting: *Deleted

## 2016-03-02 ENCOUNTER — Ambulatory Visit (INDEPENDENT_AMBULATORY_CARE_PROVIDER_SITE_OTHER): Payer: Medicaid Other | Admitting: Obstetrics and Gynecology

## 2016-03-02 VITALS — BP 124/63 | HR 99 | Wt 188.0 lb

## 2016-03-02 DIAGNOSIS — O88213 Thromboembolism in pregnancy, third trimester: Secondary | ICD-10-CM

## 2016-03-02 DIAGNOSIS — O88219 Thromboembolism in pregnancy, unspecified trimester: Secondary | ICD-10-CM

## 2016-03-02 DIAGNOSIS — O0990 Supervision of high risk pregnancy, unspecified, unspecified trimester: Secondary | ICD-10-CM

## 2016-03-02 MED ORDER — FAMOTIDINE 20 MG PO TABS: 20.0000 mg | ORAL_TABLET | Freq: Two times a day (BID) | ORAL | 3 refills | Status: AC

## 2016-03-02 NOTE — Progress Notes (Signed)
   PRENATAL VISIT NOTE  Subjective:  Nancy Francis is a 35 y.o. G5P4004 at 1411w0d being seen today for ongoing prenatal care.  She is currently monitored for the following issues for this high-risk pregnancy and has Sickle cell trait (HCC); Bilateral pulmonary embolism (HCC); Acute dyspnea; Chest pain; SVD (spontaneous vaginal delivery) 08/14; Supervision of high risk pregnancy, antepartum; Hx Pulmonary embolism, currently pregnant, antepartum; DVT of upper extremity (deep vein thrombosis) (HCC); Abnormal maternal serum screening test; and Adjustment disorder with depressed mood on her problem list.  Patient reports sciatic nerve pain on right hip.  Contractions: Not present. Vag. Bleeding: None.  Movement: Present. Denies leaking of fluid.   The following portions of the patient's history were reviewed and updated as appropriate: allergies, current medications, past family history, past medical history, past social history, past surgical history and problem list. Problem list updated.  Objective:   Vitals:   03/02/16 1551  BP: 124/63  Pulse: 99  Weight: 188 lb (85.3 kg)    Fetal Status: Fetal Heart Rate (bpm): 154 Fundal Height: 30 cm Movement: Present     General:  Alert, oriented and cooperative. Patient is in no acute distress.  Skin: Skin is warm and dry. No rash noted.   Cardiovascular: Normal heart rate noted  Respiratory: Normal respiratory effort, no problems with respiration noted  Abdomen: Soft, gravid, appropriate for gestational age. Pain/Pressure: Present     Pelvic:  Cervical exam deferred        Extremities: Normal range of motion.  Edema: None  Mental Status: Normal mood and affect. Normal behavior. Normal judgment and thought content.   Assessment and Plan:  Pregnancy: G5P4004 at 2211w0d  1. Supervision of high risk pregnancy, antepartum Advised the use of maternity support belt Results reviewed of glucola and labs Follow up growth ultrasound on 12/28  2. Hx  Pulmonary embolism, currently pregnant, antepartum Continue lovenox daily  Term labor symptoms and general obstetric precautions including but not limited to vaginal bleeding, contractions, leaking of fluid and fetal movement were reviewed in detail with the patient. Please refer to After Visit Summary for other counseling recommendations.  No Follow-up on file.   Catalina AntiguaPeggy Victor Granados, MD

## 2016-03-09 ENCOUNTER — Encounter: Payer: Medicaid Other | Admitting: Obstetrics and Gynecology

## 2016-03-10 ENCOUNTER — Other Ambulatory Visit (HOSPITAL_COMMUNITY): Payer: Self-pay | Admitting: *Deleted

## 2016-03-10 ENCOUNTER — Ambulatory Visit (HOSPITAL_COMMUNITY)
Admission: RE | Admit: 2016-03-10 | Discharge: 2016-03-10 | Disposition: A | Discharge status: Home / Self Care | Payer: Medicaid Other | Source: Ambulatory Visit | Attending: Family Medicine | Admitting: Family Medicine

## 2016-03-10 ENCOUNTER — Encounter (HOSPITAL_COMMUNITY): Payer: Self-pay

## 2016-03-10 DIAGNOSIS — O99212 Obesity complicating pregnancy, second trimester: Secondary | ICD-10-CM | POA: Diagnosis not present

## 2016-03-10 DIAGNOSIS — Z86718 Personal history of other venous thrombosis and embolism: Secondary | ICD-10-CM | POA: Diagnosis not present

## 2016-03-10 DIAGNOSIS — O281 Abnormal biochemical finding on antenatal screening of mother: Secondary | ICD-10-CM | POA: Insufficient documentation

## 2016-03-10 DIAGNOSIS — Z862 Personal history of diseases of the blood and blood-forming organs and certain disorders involving the immune mechanism: Secondary | ICD-10-CM | POA: Insufficient documentation

## 2016-03-10 DIAGNOSIS — O09893 Supervision of other high risk pregnancies, third trimester: Secondary | ICD-10-CM

## 2016-03-10 DIAGNOSIS — Z7901 Long term (current) use of anticoagulants: Secondary | ICD-10-CM | POA: Insufficient documentation

## 2016-03-10 DIAGNOSIS — Z3A31 31 weeks gestation of pregnancy: Secondary | ICD-10-CM | POA: Insufficient documentation

## 2016-03-10 DIAGNOSIS — O280 Abnormal hematological finding on antenatal screening of mother: Secondary | ICD-10-CM

## 2016-03-10 DIAGNOSIS — O09899 Supervision of other high risk pregnancies, unspecified trimester: Secondary | ICD-10-CM

## 2016-03-14 NOTE — L&D Delivery Note (Signed)
Delivery Note Pt pushed with one contraction and at 8:50 PM a viable female was delivered via Vaginal, Spontaneous Delivery (Presentation: ROA ).  APGAR: 8, 9; weight: pending.  Nuchal cord x 1 reduced easily before delivery. Cord clamped and cut by FOB; hospital cord blood sample collected. Placenta status: spont , intact .  Cord:  3 vessel  Anesthesia:  Epidural Episiotomy: None Lacerations: None Est. Blood Loss (mL): 150  Mom to postpartum.  Baby to Couplet care / Skin to Skin.  Cam HaiSHAW, Dudley Mages CNM 04/22/2016, 9:10 PM

## 2016-03-17 ENCOUNTER — Ambulatory Visit (INDEPENDENT_AMBULATORY_CARE_PROVIDER_SITE_OTHER): Payer: Medicaid Other | Admitting: Obstetrics and Gynecology

## 2016-03-17 VITALS — BP 131/67 | HR 103 | Wt 192.8 lb

## 2016-03-17 DIAGNOSIS — O88219 Thromboembolism in pregnancy, unspecified trimester: Secondary | ICD-10-CM

## 2016-03-17 DIAGNOSIS — O0990 Supervision of high risk pregnancy, unspecified, unspecified trimester: Secondary | ICD-10-CM

## 2016-03-17 DIAGNOSIS — O88213 Thromboembolism in pregnancy, third trimester: Secondary | ICD-10-CM

## 2016-03-17 NOTE — Progress Notes (Signed)
   PRENATAL VISIT NOTE  Subjective:  Nancy Francis is a 36 y.o. G5P4004 at 174w1d being seen today for ongoing prenatal care.  She is currently monitored for the following issues for this high-risk pregnancy and has Sickle cell trait (HCC); Bilateral pulmonary embolism (HCC); Acute dyspnea; Chest pain; SVD (spontaneous vaginal delivery) 08/14; Supervision of high risk pregnancy, antepartum; Hx Pulmonary embolism, currently pregnant, antepartum; DVT of upper extremity (deep vein thrombosis) (HCC); Abnormal maternal serum screening test; and Adjustment disorder with depressed mood on her problem list.  Patient reports no complaints.  Contractions: Not present. Vag. Bleeding: None.  Movement: Present. Denies leaking of fluid.   The following portions of the patient's history were reviewed and updated as appropriate: allergies, current medications, past family history, past medical history, past social history, past surgical history and problem list. Problem list updated.  Objective:   Vitals:   03/17/16 1252  BP: 131/67  Pulse: (!) 103  Weight: 192 lb 12.8 oz (87.5 kg)    Fetal Status: Fetal Heart Rate (bpm): 151 Fundal Height: 32 cm Movement: Present     General:  Alert, oriented and cooperative. Patient is in no acute distress.  Skin: Skin is warm and dry. No rash noted.   Cardiovascular: Normal heart rate noted  Respiratory: Normal respiratory effort, no problems with respiration noted  Abdomen: Soft, gravid, appropriate for gestational age. Pain/Pressure: Present     Pelvic:  Cervical exam deferred        Extremities: Normal range of motion.  Edema: Trace  Mental Status: Normal mood and affect. Normal behavior. Normal judgment and thought content.   Assessment and Plan:  Pregnancy: G5P4004 at 354w1d  1. Supervision of high risk pregnancy, antepartum Patient is doing well without complaints  2. Hx Pulmonary embolism, currently pregnant, antepartum Continue daily lovenox Plan  for IOL at 39 weeks  Preterm labor symptoms and general obstetric precautions including but not limited to vaginal bleeding, contractions, leaking of fluid and fetal movement were reviewed in detail with the patient. Please refer to After Visit Summary for other counseling recommendations.  No Follow-up on file.   Catalina AntiguaPeggy Tytiana Coles, MD

## 2016-03-22 ENCOUNTER — Inpatient Hospital Stay (HOSPITAL_COMMUNITY)
Admission: AD | Admit: 2016-03-22 | Discharge: 2016-03-22 | Disposition: A | Discharge status: Home / Self Care | Payer: Medicaid Other | Source: Ambulatory Visit | Attending: Family Medicine | Admitting: Family Medicine

## 2016-03-22 ENCOUNTER — Encounter (HOSPITAL_COMMUNITY): Payer: Self-pay

## 2016-03-22 DIAGNOSIS — Z3A32 32 weeks gestation of pregnancy: Secondary | ICD-10-CM | POA: Insufficient documentation

## 2016-03-22 DIAGNOSIS — Z87891 Personal history of nicotine dependence: Secondary | ICD-10-CM | POA: Diagnosis not present

## 2016-03-22 DIAGNOSIS — Z86711 Personal history of pulmonary embolism: Secondary | ICD-10-CM | POA: Diagnosis not present

## 2016-03-22 DIAGNOSIS — O280 Abnormal hematological finding on antenatal screening of mother: Secondary | ICD-10-CM

## 2016-03-22 DIAGNOSIS — O479 False labor, unspecified: Secondary | ICD-10-CM

## 2016-03-22 DIAGNOSIS — O88219 Thromboembolism in pregnancy, unspecified trimester: Secondary | ICD-10-CM

## 2016-03-22 DIAGNOSIS — Z880 Allergy status to penicillin: Secondary | ICD-10-CM | POA: Diagnosis not present

## 2016-03-22 DIAGNOSIS — O4703 False labor before 37 completed weeks of gestation, third trimester: Secondary | ICD-10-CM

## 2016-03-22 DIAGNOSIS — O0990 Supervision of high risk pregnancy, unspecified, unspecified trimester: Secondary | ICD-10-CM

## 2016-03-22 DIAGNOSIS — Z3A34 34 weeks gestation of pregnancy: Secondary | ICD-10-CM

## 2016-03-22 LAB — URINALYSIS, ROUTINE W REFLEX MICROSCOPIC
BILIRUBIN URINE: NEGATIVE
Bilirubin Urine: NEGATIVE
Glucose, UA: NEGATIVE mg/dL
Glucose, UA: NEGATIVE mg/dL
HGB URINE DIPSTICK: NEGATIVE
Hgb urine dipstick: NEGATIVE
Ketones, ur: NEGATIVE mg/dL
Ketones, ur: NEGATIVE mg/dL
Leukocytes, UA: NEGATIVE
NITRITE: NEGATIVE
NITRITE: NEGATIVE
PH: 6 (ref 5.0–8.0)
PROTEIN: NEGATIVE mg/dL
Protein, ur: NEGATIVE mg/dL
SPECIFIC GRAVITY, URINE: 1.014 (ref 1.005–1.030)
SPECIFIC GRAVITY, URINE: 1.004 — AB (ref 1.005–1.030)
pH: 7 (ref 5.0–8.0)

## 2016-03-22 LAB — WET PREP, GENITAL
Clue Cells Wet Prep HPF POC: NONE SEEN
SPERM: NONE SEEN
Trich, Wet Prep: NONE SEEN
Yeast Wet Prep HPF POC: NONE SEEN

## 2016-03-22 LAB — FETAL FIBRONECTIN: Fetal Fibronectin: NEGATIVE

## 2016-03-22 NOTE — MAU Provider Note (Signed)
History     CSN: [id number]  Arrival date and time: 03/22/16 0555    Chief Complaint  Patient presents with  . Contractions   HPI  Patient is a 36 y.o. G5P4004 at 6747w6d who presents with contractions. Patient states that around 1 AM she got out of the shower and went to lay down in bed. As she was laying down she felt abdominal contractions that were about every 2 minutes. The contractions continued for a couple hours and became a little painful so she decided to come to the MAU. Last sexual intercourse was 2 weeks ago. Denies any recent trauma or new level of activity. Admits to good appetite and has been keeping hydrated. Denies any LOF, vaginal bleeding, vaginal discharge. Positive fetal movement.   OB History    Gravida Para Term Preterm AB Living   5 4 4  0 0 4   SAB TAB Ectopic Multiple Live Births   0 0 0 0 2      Past Medical History:  Diagnosis Date  . Abnormal Pap smear 2010   repeat was negative  . Anemia   . Anxiety   . Depression   . PE (pulmonary thromboembolism) (HCC)   . Sickle cell trait [facility](HCC)     Past Surgical History:  Procedure Laterality Date  . COLPOSCOPY    . FRACTURE SURGERY Right    3 surgeries    Family History  Problem Relation Age of Onset  . Asthma Sister   . Diabetes Maternal Aunt     Social History  Substance Use Topics  . Smoking status: Former Smoker    Packs/day: 0.25    Types: Cigarettes    Quit date: 11/04/2015  . Smokeless tobacco: Never Used  . Alcohol use No     Comment: once a week    Allergies:  Allergies  Allergen Reactions  . Penicillins Hives    Has patient had a PCN reaction causing immediate rash, facial/tongue/throat swelling, SOB or lightheadedness with hypotension: Yes Has patient had a PCN reaction causing severe rash involving mucus membranes or skin necrosis: Yes Has patient had a PCN reaction that required hospitalization Yes Has patient had a PCN reaction occurring within the last 10 years: No If all  of the above answers are "NO", then may proceed with Cephalosporin use.     No prescriptions prior to admission.    Review of Systems  Constitutional: Negative for appetite change and fever.  HENT: Negative for rhinorrhea.   Respiratory: Negative for cough and shortness of breath.   Cardiovascular: Negative for chest pain and leg swelling.  Gastrointestinal: Positive for abdominal pain (contractions). Negative for constipation, diarrhea, nausea and vomiting.  Genitourinary: Negative for vaginal pain.  Skin: Negative for rash.  Neurological: Negative for weakness and numbness.   Physical Exam   Blood pressure (!) 101/50, pulse 90, temperature 98.4 F (36.9 C), temperature source Oral, resp. rate 18, last menstrual period 08/05/2015.  Physical Exam  Constitutional: She is oriented to person, place, and time. She appears well-developed and well-nourished.  HENT:  Head: Normocephalic and atraumatic.  Right Ear: External ear normal.  Left Ear: External ear normal.  Mouth/Throat: Oropharynx is clear and moist.  Eyes: Conjunctivae and EOM are normal. Pupils are equal, round, and reactive to light.  Neck: Normal range of motion.  Cardiovascular: Normal rate, regular rhythm and intact distal pulses.   Respiratory: No respiratory distress.  GI: Soft. She exhibits distension (gravid abdomen). There is no tenderness.  Genitourinary: Vagina normal.  Genitourinary Comments: GYN:  External genitalia within normal limits.  Vaginal mucosa pink, moist, normal rugae.  Nonfriable cervix without lesions, no discharge or bleeding noted on speculum exam.  Bimanual exam revealed gravid uterus.  No cervical motion tenderness. No adnexal masses bilaterally.     Musculoskeletal: Normal range of motion. She exhibits no edema.  Neurological: She is alert and oriented to person, place, and time. She exhibits normal muscle tone.  Skin: Skin is warm and dry. No rash noted.  Psychiatric: She has a normal mood  and affect.  Dilation: 1 Effacement (%): 60 Cervical Position: Posterior Exam by:: Dr. Omer Jack   MAU Course  Procedures  MDM Vitals within normal limits FHT showing 145 bpm, moderate variability, good accelerations, no decels Only a few very mild contractions were seen on the monitor but contractions were manually palpated on abdomen by RN.  FFN negative  No cervical change on exam when checked 1 hour apart 1st UA dirty catch, 2nd UA showing no bacteria, negative nitrites and leukocytes Wet prep negative for BV or Trich GC Chlamydia obtained  Assessment and Plan  Contractions: Likely Braxton Hicks. No signs of labor. No signs of infection. GC/Chlamydia is pending however.  - Encouraged patient to continue adequate oral hydration - Labor and return precautions discussed - Follow up with OB  Beaulah Dinning 03/22/2016, 9:50 AM    OB FELLOW MAU DISCHARGE ATTESTATION  I have seen and examined this patient; I agree with above documentation in the resident's note. I personally assessed this patient and rechecked her cervix and found she was not in labor.   I personally reviewed the patient's NST today, found to be REACTIVE. 150 bpm, mod var, +accels, no decels. CTX: None.   Jen Mow, DO OB Fellow 03/22/2016  10:20 AM

## 2016-03-22 NOTE — Discharge Instructions (Signed)
Preterm Labor and Birth Information Pregnancy normally lasts 39-41 weeks. Preterm labor is when labor starts early. It starts before you have been pregnant for 37 whole weeks. What are the risk factors for preterm labor? Preterm labor is more likely to occur in women who:  Have an infection while pregnant.  Have a cervix that is short.  Have gone into preterm labor before.  Have had surgery on their cervix.  Are younger than age 34.  Are older than age 34.  Are African American.  Are pregnant with two or more babies.  Take street drugs while pregnant.  Smoke while pregnant.  Do not gain enough weight while pregnant.  Got pregnant right after another pregnancy. What are the symptoms of preterm labor? Symptoms of preterm labor include:  Cramps. The cramps may feel like the cramps some women get during their period. The cramps may happen with watery poop (diarrhea).  Pain in the belly (abdomen).  Pain in the lower back.  Regular contractions or tightening. It may feel like your belly is getting tighter.  Pressure in the lower belly that seems to get stronger.  More fluid (discharge) leaking from the vagina. The fluid may be watery or bloody.  Water breaking. Why is it important to notice signs of preterm labor? Babies who are born early may not be fully developed. They have a higher chance for:  Long-term heart problems.  Long-term lung problems.  Trouble controlling body systems, like breathing.  Bleeding in the brain.  A condition called cerebral palsy.  Learning difficulties.  Death. These risks are highest for babies who are born before 34 weeks of pregnancy. How is preterm labor treated? Treatment depends on:  How long you were pregnant.  Your condition.  The health of your baby. Treatment may involve:  Having a stitch (suture) placed in your cervix. When you give birth, your cervix opens so the baby can come out. The stitch keeps the cervix  from opening too soon.  Staying at the hospital.  Taking or getting medicines, such as:  Hormone medicines.  Medicines to stop contractions.  Medicines to help the babys lungs develop.  Medicines to prevent your baby from having cerebral palsy. What should I do if I am in preterm labor? If you think you are going into labor too soon, call your doctor right away. How can I prevent preterm labor?  Do not use any tobacco products.  Examples of these are cigarettes, chewing tobacco, and e-cigarettes.  If you need help quitting, ask your doctor.  Do not use street drugs.  Do not use any medicines unless you ask your doctor if they are safe for you.  Talk with your doctor before taking any herbal supplements.  Make sure you gain enough weight.  Watch for infection. If you think you might have an infection, get it checked right away.  If you have gone into preterm labor before, tell your doctor. This information is not intended to replace advice given to you by your health care provider. Make sure you discuss any questions you have with your health care provider. Document Released: 05/27/2008 Document Revised: 08/11/2015 Document Reviewed: 07/22/2015 Elsevier Interactive Patient Education  2017 Elsevier Inc.   Ball CorporationBraxton Hicks Contractions Contractions of the uterus can occur throughout pregnancy. Contractions are not always a sign that you are in labor.  WHAT ARE BRAXTON HICKS CONTRACTIONS?  Contractions that occur before labor are called Braxton Hicks contractions, or false labor. Toward the end of pregnancy (  32-34 weeks), these contractions can develop more often and may become more forceful. This is not true labor because these contractions do not result in opening (dilatation) and thinning of the cervix. They are sometimes difficult to tell apart from true labor because these contractions can be forceful and people have different pain tolerances. You should not feel embarrassed  if you go to the hospital with false labor. Sometimes, the only way to tell if you are in true labor is for your health care provider to look for changes in the cervix. If there are no prenatal problems or other health problems associated with the pregnancy, it is completely safe to be sent home with false labor and await the onset of true labor. HOW CAN YOU TELL THE DIFFERENCE BETWEEN TRUE AND FALSE LABOR? False Labor   The contractions of false labor are usually shorter and not as hard as those of true labor.   The contractions are usually irregular.   The contractions are often felt in the front of the lower abdomen and in the groin.   The contractions may go away when you walk around or change positions while lying down.   The contractions get weaker and are shorter lasting as time goes on.   The contractions do not usually become progressively stronger, regular, and closer together as with true labor.  True Labor   Contractions in true labor last 30-70 seconds, become very regular, usually become more intense, and increase in frequency.   The contractions do not go away with walking.   The discomfort is usually felt in the top of the uterus and spreads to the lower abdomen and low back.   True labor can be determined by your health care provider with an exam. This will show that the cervix is dilating and getting thinner.  WHAT TO REMEMBER  Keep up with your usual exercises and follow other instructions given by your health care provider.   Take medicines as directed by your health care provider.   Keep your regular prenatal appointments.   Eat and drink lightly if you think you are going into labor.   If Braxton Hicks contractions are making you uncomfortable:   Change your position from lying down or resting to walking, or from walking to resting.   Sit and rest in a tub of warm water.   Drink 2-3 glasses of water. Dehydration may cause these  contractions.   Do slow and deep breathing several times an hour.  WHEN SHOULD I SEEK IMMEDIATE MEDICAL CARE? Seek immediate medical care if:  Your contractions become stronger, more regular, and closer together.   You have fluid leaking or gushing from your vagina.   You have a fever.   You pass blood-tinged mucus.   You have vaginal bleeding.   You have continuous abdominal pain.   You have low back pain that you never had before.   You feel your baby's head pushing down and causing pelvic pressure.   Your baby is not moving as much as it used to.  This information is not intended to replace advice given to you by your health care provider. Make sure you discuss any questions you have with your health care provider. Document Released: 02/28/2005 Document Revised: 06/22/2015 Document Reviewed: 12/10/2012 Elsevier Interactive Patient Education  2017 [employer].

## 2016-03-22 NOTE — MAU Note (Signed)
Pt presents complaining of contractions that started at 1am. States they are 2 minutes apart and are getting worse. Denies leaking of fluid or vaginal bleeding. Reports good fetal movement.

## 2016-03-23 LAB — GC/CHLAMYDIA PROBE AMP (~~LOC~~) NOT AT ARMC
Chlamydia: NEGATIVE
Neisseria Gonorrhea: NEGATIVE

## 2016-03-24 ENCOUNTER — Encounter: Payer: Medicaid Other | Admitting: Family Medicine

## 2016-03-28 ENCOUNTER — Ambulatory Visit (INDEPENDENT_AMBULATORY_CARE_PROVIDER_SITE_OTHER): Payer: Medicaid Other | Admitting: Family Medicine

## 2016-03-28 VITALS — BP 127/77 | HR 98 | Wt 195.8 lb

## 2016-03-28 DIAGNOSIS — D573 Sickle-cell trait: Secondary | ICD-10-CM

## 2016-03-28 DIAGNOSIS — O280 Abnormal hematological finding on antenatal screening of mother: Secondary | ICD-10-CM

## 2016-03-28 DIAGNOSIS — O88219 Thromboembolism in pregnancy, unspecified trimester: Secondary | ICD-10-CM

## 2016-03-28 DIAGNOSIS — O4700 False labor before 37 completed weeks of gestation, unspecified trimester: Secondary | ICD-10-CM

## 2016-03-28 DIAGNOSIS — I2699 Other pulmonary embolism without acute cor pulmonale: Secondary | ICD-10-CM

## 2016-03-28 DIAGNOSIS — O479 False labor, unspecified: Secondary | ICD-10-CM

## 2016-03-28 DIAGNOSIS — R8290 Unspecified abnormal findings in urine: Secondary | ICD-10-CM

## 2016-03-28 DIAGNOSIS — O88213 Thromboembolism in pregnancy, third trimester: Secondary | ICD-10-CM

## 2016-03-28 DIAGNOSIS — O0990 Supervision of high risk pregnancy, unspecified, unspecified trimester: Secondary | ICD-10-CM

## 2016-03-28 LAB — FETAL FIBRONECTIN: FETAL FIBRONECTIN: NEGATIVE

## 2016-03-28 NOTE — Patient Instructions (Signed)
Preterm Labor and Birth Information °Pregnancy normally lasts 39-41 weeks. Preterm labor is when labor starts early. It starts before you have been pregnant for 37 whole weeks. °What are the risk factors for preterm labor? °Preterm labor is more likely to occur in women who: °· Have an infection while pregnant. °· Have a cervix that is short. °· Have gone into preterm labor before. °· Have had surgery on their cervix. °· Are younger than age 34. °· Are older than age 35. °· Are African American. °· Are pregnant with two or more babies. °· Take street drugs while pregnant. °· Smoke while pregnant. °· Do not gain enough weight while pregnant. °· Got pregnant right after another pregnancy. °What are the symptoms of preterm labor? °Symptoms of preterm labor include: °· Cramps. The cramps may feel like the cramps some women get during their period. The cramps may happen with watery poop (diarrhea). °· Pain in the belly (abdomen). °· Pain in the lower back. °· Regular contractions or tightening. It may feel like your belly is getting tighter. °· Pressure in the lower belly that seems to get stronger. °· More fluid (discharge) leaking from the vagina. The fluid may be watery or bloody. °· Water breaking. °Why is it important to notice signs of preterm labor? °Babies who are born early may not be fully developed. They have a higher chance for: °· Long-term heart problems. °· Long-term lung problems. °· Trouble controlling body systems, like breathing. °· Bleeding in the brain. °· A condition called cerebral palsy. °· Learning difficulties. °· Death. °These risks are highest for babies who are born before 34 weeks of pregnancy. °How is preterm labor treated? °Treatment depends on: °· How long you were pregnant. °· Your condition. °· The health of your baby. °Treatment may involve: °· Having a stitch (suture) placed in your cervix. When you give birth, your cervix opens so the baby can come out. The stitch keeps the cervix  from opening too soon. °· Staying at the hospital. °· Taking or getting medicines, such as: °¨ Hormone medicines. °¨ Medicines to stop contractions. °¨ Medicines to help the baby’s lungs develop. °¨ Medicines to prevent your baby from having cerebral palsy. °What should I do if I am in preterm labor? °If you think you are going into labor too soon, call your doctor right away. °How can I prevent preterm labor? °· Do not use any tobacco products. °¨ Examples of these are cigarettes, chewing tobacco, and e-cigarettes. °¨ If you need help quitting, ask your doctor. °· Do not use street drugs. °· Do not use any medicines unless you ask your doctor if they are safe for you. °· Talk with your doctor before taking any herbal supplements. °· Make sure you gain enough weight. °· Watch for infection. If you think you might have an infection, get it checked right away. °· If you have gone into preterm labor before, tell your doctor. °This information is not intended to replace advice given to you by your health care provider. Make sure you discuss any questions you have with your health care provider. °Document Released: 05/27/2008 Document Revised: 08/11/2015 Document Reviewed: 07/22/2015 °Elsevier Interactive Patient Education © 2017 Elsevier Inc. ° °

## 2016-03-28 NOTE — Progress Notes (Signed)
   Subjective:  Nancy Francis is a 36 y.o. G5P4004 at 5851w5d being seen today for ongoing prenatal care.  She is currently monitored for the following issues for this high-risk pregnancy and has Sickle cell trait (HCC); Bilateral pulmonary embolism (HCC); Acute dyspnea; Chest pain; SVD (spontaneous vaginal delivery) 08/14; Supervision of high risk pregnancy, antepartum; Hx Pulmonary embolism, currently pregnant, antepartum; DVT of upper extremity (deep vein thrombosis) (HCC); Abnormal maternal serum screening test; and Adjustment disorder with depressed mood Francis her problem list.  Patient reports occasional contractions. Contractions worse at night. Not currently present as frequently (occasional now, now strong, none while in room examining patient).  Vag. Bleeding: None.  Movement: Present. Denies leaking of fluid.   The following portions of the patient's history were reviewed and updated as appropriate: allergies, current medications, past family history, past medical history, past social history, past surgical history and problem list. Problem list updated.  Objective:   Vitals:   03/28/16 1401  BP: 127/77  Pulse: 98  Weight: 195 lb 12.8 oz (88.8 kg)    Fetal Status: Fetal Heart Rate (bpm): 157 Fundal Height: 33 cm Movement: Present     General:  Alert, oriented and cooperative. Patient is in no acute distress.  Skin: Skin is warm and dry. No rash noted.   Cardiovascular: Normal heart rate noted  Respiratory: Normal respiratory effort, no problems with respiration noted  Abdomen: Soft, gravid, appropriate for gestational age. Pain/Pressure: Present     Pelvic:  Cervical exam performed      1.5/60/ballotable  Extremities: Normal range of motion.  Edema: Trace  Mental Status: Normal mood and affect. Normal behavior. Normal judgment and thought content.   Urinalysis:      Assessment and Plan:  Pregnancy: G5P4004 at 2051w5d  1. Supervision of high risk pregnancy, antepartum - Francis  Lovenox - IOL at 39 weeks if still pregnant - Preterm labor precautions given - FFN sent, no cervical change since previous exam 1 week ago (same provider)  2. Hx Pulmonary embolism, currently pregnant, antepartum - Taking Lovenox daily, not missing doses.  - No sign/symptoms of PE (no SOB/CP)  3. Bilateral pulmonary embolism (HCC) - See above  4. Sickle cell trait (HCC)  5. Abnormal maternal serum screening test  6. Preterm contractions - SVE was no change from previous MAU visit (by same provider) 1 week ago - FFN performed prior to SVE, had intercourse about 2 nights ago, FFN was negative 6 days ago - Fetal fibronectin  7. Abnormal urinalysis - UA: +leuks, +ketones, -nitrites - Culture, OB Urine   Preterm labor symptoms and general obstetric precautions including but not limited to vaginal bleeding, contractions, leaking of fluid and fetal movement were reviewed in detail with the patient. Please refer to After Visit Summary for other counseling recommendations.  Return in about 2 weeks (around 04/11/2016) for Routine OB visit.   Michaele OfferElizabeth Woodland Mumaw, DO OB Fellow [facility]

## 2016-03-29 LAB — POCT URINALYSIS DIP (DEVICE)
GLUCOSE, UA: NEGATIVE mg/dL
Hgb urine dipstick: NEGATIVE
Nitrite: NEGATIVE
PH: 6 (ref 5.0–8.0)
PROTEIN: NEGATIVE mg/dL
Specific Gravity, Urine: 1.025 (ref 1.005–1.030)
Urobilinogen, UA: 1 mg/dL (ref 0.0–1.0)

## 2016-03-29 LAB — CULTURE, OB URINE: ORGANISM ID, BACTERIA: NO GROWTH

## 2016-04-07 ENCOUNTER — Other Ambulatory Visit (HOSPITAL_COMMUNITY): Payer: Self-pay | Admitting: Maternal and Fetal Medicine

## 2016-04-07 ENCOUNTER — Other Ambulatory Visit (HOSPITAL_COMMUNITY)
Admission: RE | Admit: 2016-04-07 | Discharge: 2016-04-07 | Disposition: A | Discharge status: Home / Self Care | Payer: Medicaid Other | Source: Ambulatory Visit | Attending: Obstetrics & Gynecology | Admitting: Obstetrics & Gynecology

## 2016-04-07 ENCOUNTER — Ambulatory Visit (INDEPENDENT_AMBULATORY_CARE_PROVIDER_SITE_OTHER): Payer: Medicaid Other | Admitting: Obstetrics & Gynecology

## 2016-04-07 ENCOUNTER — Encounter: Payer: Self-pay | Admitting: Obstetrics & Gynecology

## 2016-04-07 ENCOUNTER — Ambulatory Visit (HOSPITAL_COMMUNITY)
Admission: RE | Admit: 2016-04-07 | Discharge: 2016-04-07 | Disposition: A | Discharge status: Home / Self Care | Payer: Medicaid Other | Source: Ambulatory Visit | Attending: Family Medicine | Admitting: Family Medicine

## 2016-04-07 ENCOUNTER — Encounter (HOSPITAL_COMMUNITY): Payer: Self-pay

## 2016-04-07 VITALS — BP 124/67 | HR 105

## 2016-04-07 VITALS — BP 103/56 | HR 107 | Wt 195.5 lb

## 2016-04-07 DIAGNOSIS — O288 Other abnormal findings on antenatal screening of mother: Secondary | ICD-10-CM

## 2016-04-07 DIAGNOSIS — O09893 Supervision of other high risk pregnancies, third trimester: Secondary | ICD-10-CM | POA: Diagnosis present

## 2016-04-07 DIAGNOSIS — O88219 Thromboembolism in pregnancy, unspecified trimester: Secondary | ICD-10-CM

## 2016-04-07 DIAGNOSIS — Z3A35 35 weeks gestation of pregnancy: Secondary | ICD-10-CM

## 2016-04-07 DIAGNOSIS — O88213 Thromboembolism in pregnancy, third trimester: Secondary | ICD-10-CM | POA: Diagnosis present

## 2016-04-07 DIAGNOSIS — Z86718 Personal history of other venous thrombosis and embolism: Principal | ICD-10-CM

## 2016-04-07 DIAGNOSIS — Z113 Encounter for screening for infections with a predominantly sexual mode of transmission: Secondary | ICD-10-CM | POA: Diagnosis not present

## 2016-04-07 DIAGNOSIS — O0990 Supervision of high risk pregnancy, unspecified, unspecified trimester: Secondary | ICD-10-CM

## 2016-04-07 DIAGNOSIS — O0993 Supervision of high risk pregnancy, unspecified, third trimester: Secondary | ICD-10-CM

## 2016-04-07 LAB — OB RESULTS CONSOLE GBS: STREP GROUP B AG: NEGATIVE

## 2016-04-07 LAB — POCT URINALYSIS DIP (DEVICE)
Bilirubin Urine: NEGATIVE
GLUCOSE, UA: NEGATIVE mg/dL
Ketones, ur: NEGATIVE mg/dL
Nitrite: NEGATIVE
PROTEIN: NEGATIVE mg/dL
SPECIFIC GRAVITY, URINE: 1.025 (ref 1.005–1.030)
UROBILINOGEN UA: 0.2 mg/dL (ref 0.0–1.0)
pH: 6 (ref 5.0–8.0)

## 2016-04-07 LAB — OB RESULTS CONSOLE GC/CHLAMYDIA: GC PROBE AMP, GENITAL: NEGATIVE

## 2016-04-07 NOTE — Progress Notes (Signed)
   PRENATAL VISIT NOTE  Subjective:  Nancy Francis is a 36 y.o. G5P4004 at 4064w1d being seen today for ongoing prenatal care.  She is currently monitored for the following issues for this high-risk pregnancy and has Sickle cell trait (HCC); Supervision of high risk pregnancy, antepartum; Hx Pulmonary embolism, currently pregnant, antepartum; DVT of upper extremity (deep vein thrombosis) (HCC); Abnormal maternal serum screening test; and Adjustment disorder with depressed mood on her problem list.  Patient reports no complaints.  Contractions: Not present. Vag. Bleeding: None.  Movement: Present. Denies leaking of fluid.   The following portions of the patient's history were reviewed and updated as appropriate: allergies, current medications, past family history, past medical history, past social history, past surgical history and problem list. Problem list updated.  Objective:   Vitals:   04/07/16 0910  BP: (!) 103/56  Pulse: (!) 107  Weight: 195 lb 8 oz (88.7 kg)    Fetal Status: Fetal Heart Rate (bpm): 155   Movement: Present     General:  Alert, oriented and cooperative. Patient is in no acute distress.  Skin: Skin is warm and dry. No rash noted.   Cardiovascular: Normal heart rate noted  Respiratory: Normal respiratory effort, no problems with respiration noted  Abdomen: Soft, gravid, appropriate for gestational age. Pain/Pressure: Present     Pelvic:  Cervical exam performed        Extremities: Normal range of motion.  Edema: None  Mental Status: Normal mood and affect. Normal behavior. Normal judgment and thought content.   Assessment and Plan:  Pregnancy: G5P4004 at 7564w1d  1. Supervision of high risk pregnancy in third trimester  - Culture, Grp B Strep w/Rflx Suscept - Cervicovaginal ancillary only  2. Supervision of high risk pregnancy, antepartum   3. Hx Pulmonary embolism, currently pregnant, antepartum   Preterm labor symptoms and general obstetric  precautions including but not limited to vaginal bleeding, contractions, leaking of fluid and fetal movement were reviewed in detail with the patient. Please refer to After Visit Summary for other counseling recommendations.  Return in about 1 week (around 04/14/2016).   Adam PhenixJames G Quadasia Newsham, MD

## 2016-04-07 NOTE — Progress Notes (Signed)
36 week cultures today 

## 2016-04-07 NOTE — Patient Instructions (Signed)

## 2016-04-08 LAB — CERVICOVAGINAL ANCILLARY ONLY
Chlamydia: NEGATIVE
Neisseria Gonorrhea: NEGATIVE

## 2016-04-09 LAB — CULTURE, STREPTOCOCCUS GRP B W/SUSCEPT

## 2016-04-11 ENCOUNTER — Ambulatory Visit (HOSPITAL_COMMUNITY)
Admission: RE | Admit: 2016-04-11 | Discharge: 2016-04-11 | Disposition: A | Discharge status: Home / Self Care | Payer: Medicaid Other | Source: Ambulatory Visit | Attending: Family Medicine | Admitting: Family Medicine

## 2016-04-11 ENCOUNTER — Encounter (HOSPITAL_COMMUNITY): Payer: Self-pay

## 2016-04-14 ENCOUNTER — Encounter: Payer: Medicaid Other | Admitting: Obstetrics & Gynecology

## 2016-04-21 ENCOUNTER — Encounter (HOSPITAL_COMMUNITY): Payer: Self-pay

## 2016-04-21 ENCOUNTER — Inpatient Hospital Stay (HOSPITAL_COMMUNITY)
Admission: AD | Admit: 2016-04-21 | Discharge: 2016-04-24 | DRG: 767 | Disposition: A | Discharge status: Home / Self Care | Payer: Medicaid Other | Source: Ambulatory Visit | Attending: Obstetrics and Gynecology | Admitting: Obstetrics and Gynecology

## 2016-04-21 DIAGNOSIS — Z3A37 37 weeks gestation of pregnancy: Secondary | ICD-10-CM

## 2016-04-21 DIAGNOSIS — Z302 Encounter for sterilization: Secondary | ICD-10-CM

## 2016-04-21 DIAGNOSIS — F418 Other specified anxiety disorders: Secondary | ICD-10-CM | POA: Diagnosis present

## 2016-04-21 DIAGNOSIS — O99344 Other mental disorders complicating childbirth: Secondary | ICD-10-CM | POA: Diagnosis present

## 2016-04-21 DIAGNOSIS — Z86718 Personal history of other venous thrombosis and embolism: Secondary | ICD-10-CM

## 2016-04-21 DIAGNOSIS — Z86711 Personal history of pulmonary embolism: Secondary | ICD-10-CM

## 2016-04-21 DIAGNOSIS — Z3493 Encounter for supervision of normal pregnancy, unspecified, third trimester: Secondary | ICD-10-CM | POA: Diagnosis present

## 2016-04-21 DIAGNOSIS — O9902 Anemia complicating childbirth: Secondary | ICD-10-CM | POA: Diagnosis present

## 2016-04-21 DIAGNOSIS — O88219 Thromboembolism in pregnancy, unspecified trimester: Secondary | ICD-10-CM

## 2016-04-21 DIAGNOSIS — O0990 Supervision of high risk pregnancy, unspecified, unspecified trimester: Secondary | ICD-10-CM

## 2016-04-21 DIAGNOSIS — Z87891 Personal history of nicotine dependence: Secondary | ICD-10-CM | POA: Diagnosis not present

## 2016-04-21 DIAGNOSIS — O280 Abnormal hematological finding on antenatal screening of mother: Secondary | ICD-10-CM

## 2016-04-21 DIAGNOSIS — D573 Sickle-cell trait: Secondary | ICD-10-CM | POA: Diagnosis present

## 2016-04-21 LAB — CBC
HEMATOCRIT: 29 % — AB (ref 36.0–46.0)
HEMOGLOBIN: 9.6 g/dL — AB (ref 12.0–15.0)
MCH: 29.2 pg (ref 26.0–34.0)
MCHC: 33.1 g/dL (ref 30.0–36.0)
MCV: 88.1 fL (ref 78.0–100.0)
Platelets: 215 10*3/uL (ref 150–400)
RBC: 3.29 MIL/uL — AB (ref 3.87–5.11)
RDW: 16.4 % — ABNORMAL HIGH (ref 11.5–15.5)
WBC: 6.8 10*3/uL (ref 4.0–10.5)

## 2016-04-21 LAB — TYPE AND SCREEN
ABO/RH(D): B POS
ANTIBODY SCREEN: NEGATIVE

## 2016-04-21 MED ORDER — LACTATED RINGERS IV SOLN
INTRAVENOUS | Status: DC
  Administered 2016-04-21 – 2016-04-22 (×4): via INTRAVENOUS

## 2016-04-21 MED ORDER — EPHEDRINE 5 MG/ML INJ
10.0000 mg | INTRAVENOUS | Status: DC | PRN
  Filled 2016-04-21: qty 4

## 2016-04-21 MED ORDER — DIPHENHYDRAMINE HCL 50 MG/ML IJ SOLN: 12.5000 mg | INTRAMUSCULAR | Status: DC | PRN

## 2016-04-21 MED ORDER — OXYTOCIN 40 UNITS IN LACTATED RINGERS INFUSION - SIMPLE MED
2.5000 [IU]/h | INTRAVENOUS | Status: DC
  Filled 2016-04-21: qty 1000

## 2016-04-21 MED ORDER — PHENYLEPHRINE 40 MCG/ML (10ML) SYRINGE FOR IV PUSH (FOR BLOOD PRESSURE SUPPORT)
80.0000 ug | PREFILLED_SYRINGE | INTRAVENOUS | Status: DC | PRN
  Filled 2016-04-21: qty 5

## 2016-04-21 MED ORDER — ONDANSETRON HCL 4 MG/2ML IJ SOLN: 4.0000 mg | Freq: Four times a day (QID) | INTRAMUSCULAR | Status: DC | PRN

## 2016-04-21 MED ORDER — FENTANYL CITRATE (PF) 100 MCG/2ML IJ SOLN: 50.0000 ug | INTRAMUSCULAR | Status: DC | PRN

## 2016-04-21 MED ORDER — OXYCODONE-ACETAMINOPHEN 5-325 MG PO TABS: 2.0000 | ORAL_TABLET | ORAL | Status: DC | PRN

## 2016-04-21 MED ORDER — LIDOCAINE HCL (PF) 1 % IJ SOLN
30.0000 mL | INTRAMUSCULAR | Status: DC | PRN
  Filled 2016-04-21: qty 30

## 2016-04-21 MED ORDER — FENTANYL 2.5 MCG/ML BUPIVACAINE 1/10 % EPIDURAL INFUSION (WH - ANES)
14.0000 mL/h | INTRAMUSCULAR | Status: DC | PRN
Start: 2016-04-21 — End: 2016-04-22
  Administered 2016-04-22 (×3): 14 mL/h via EPIDURAL
  Filled 2016-04-21 (×3): qty 100

## 2016-04-21 MED ORDER — OXYCODONE-ACETAMINOPHEN 5-325 MG PO TABS: 1.0000 | ORAL_TABLET | ORAL | Status: DC | PRN

## 2016-04-21 MED ORDER — LACTATED RINGERS IV SOLN
500.0000 mL | Freq: Once | INTRAVENOUS | Status: AC
  Administered 2016-04-22: 500 mL via INTRAVENOUS

## 2016-04-21 MED ORDER — SOD CITRATE-CITRIC ACID 500-334 MG/5ML PO SOLN: 30.0000 mL | ORAL | Status: DC | PRN

## 2016-04-21 MED ORDER — FLEET ENEMA 7-19 GM/118ML RE ENEM: 1.0000 | ENEMA | RECTAL | Status: DC | PRN

## 2016-04-21 MED ORDER — OXYTOCIN BOLUS FROM INFUSION
500.0000 mL | Freq: Once | INTRAVENOUS | Status: AC
  Administered 2016-04-22: 500 mL via INTRAVENOUS

## 2016-04-21 MED ORDER — PHENYLEPHRINE 40 MCG/ML (10ML) SYRINGE FOR IV PUSH (FOR BLOOD PRESSURE SUPPORT)
80.0000 ug | PREFILLED_SYRINGE | INTRAVENOUS | Status: DC | PRN
  Filled 2016-04-21: qty 5
  Filled 2016-04-21: qty 10

## 2016-04-21 MED ORDER — ACETAMINOPHEN 325 MG PO TABS: 650.0000 mg | ORAL_TABLET | ORAL | Status: DC | PRN

## 2016-04-21 MED ORDER — LACTATED RINGERS IV SOLN
500.0000 mL | INTRAVENOUS | Status: DC | PRN
  Administered 2016-04-22: 500 mL via INTRAVENOUS

## 2016-04-21 NOTE — MAU Note (Signed)
Pt reports she has ctx on and off all day long. Increased pelvic pain and pressure. Good fetal movement. Pt reports mucusy discharge no bleeding.

## 2016-04-21 NOTE — MAU Note (Signed)
IV started and labs drawn and sent to lab

## 2016-04-21 NOTE — Anesthesia Preprocedure Evaluation (Signed)
Anesthesia Evaluation  Patient identified by MRN, date of birth, ID band Patient awake    Reviewed: Allergy & Precautions, Patient's Chart, lab work & pertinent test results  Airway Mallampati: III       Dental no notable dental hx. (+) Teeth Intact   Pulmonary former smoker,    Pulmonary exam normal breath sounds clear to auscultation       Cardiovascular + Peripheral Vascular Disease  Normal cardiovascular exam Rhythm:Regular Rate:Normal  Hx/o Bilateral PE  Post partum 05/2012- on Lovenox , non compliant, last dose 2/1 DVT Left Upper Extremity 10/2015- Brachial vein, placed on Lovenox   Neuro/Psych PSYCHIATRIC DISORDERS Anxiety Depression negative neurological ROS     GI/Hepatic Neg liver ROS, GERD  Medicated and Controlled,  Endo/Other  Obesity  Renal/GU negative Renal ROS  negative genitourinary   Musculoskeletal negative musculoskeletal ROS (+)   Abdominal (+) + obese,   Peds  Hematology  (+) Sickle cell trait and anemia , On Lovenox- non compliant   Anesthesia Other Findings   Reproductive/Obstetrics (+) Pregnancy                             Anesthesia Physical Anesthesia Plan  ASA: III  Anesthesia Plan: Epidural   Post-op Pain Management:    Induction:   Airway Management Planned: Natural Airway  Additional Equipment:   Intra-op Plan:   Post-operative Plan:   Informed Consent: I have reviewed the patients History and Physical, chart, labs and discussed the procedure including the risks, benefits and alternatives for the proposed anesthesia with the patient or authorized representative who has indicated his/her understanding and acceptance.     Plan Discussed with: Anesthesiologist  Anesthesia Plan Comments:         Anesthesia Quick Evaluation

## 2016-04-21 NOTE — H&P (Signed)
Nancy Francis is a 36 y.o. female [phone] with IUP at 1363w1d presenting for contractions. Pt states she has been having irregular, every 3-5 minutes contractions, associated with none vaginal bleeding all day..  Membranes are intact, with active fetal movement.   [facility]  Prenatal History/Complications: HX DVT/PE on lovenox  Past Medical History: Past Medical History:  Diagnosis Date  . Abnormal Pap smear 2010   repeat was negative  . Anemia   . Anxiety   . Depression   . PE (pulmonary thromboembolism) (HCC)   . Sickle cell trait [facility](HCC)     Past Surgical History: Past Surgical History:  Procedure Laterality Date  . COLPOSCOPY    . FRACTURE SURGERY Right    3 surgeries    Obstetrical History: OB History    Gravida Para Term Preterm AB Living   5 4 4  0 0 4   SAB TAB Ectopic Multiple Live Births   0 0 0 0 4       Social History: Social History   Social History  . Marital status: Single    Spouse name: N/A  . Number of children: N/A  . Years of education: N/A   Social History Main Topics  . Smoking status: Former Smoker    Packs/day: 0.25    Types: Cigarettes    Quit date: 11/04/2015  . Smokeless tobacco: Never Used  . Alcohol use No     Comment: once a week  . Drug use: No  . Sexual activity: Yes    Birth control/ protection: None   Other Topics Concern  . None   Social History Narrative  . None    Family History: Family History  Problem Relation Age of Onset  . Asthma Sister   . Diabetes Maternal Aunt     Allergies: Allergies  Allergen Reactions  . Penicillins Hives    Has patient had a PCN reaction causing immediate rash, facial/tongue/throat swelling, SOB or lightheadedness with hypotension: Yes Has patient had a PCN reaction causing severe rash involving mucus membranes or skin necrosis: Yes Has patient had a PCN reaction that required hospitalization Yes Has patient had a PCN reaction occurring within the last 10  years: No If all of the above answers are "NO", then may proceed with Cephalosporin use.     Prescriptions Prior to Admission  Medication Sig Dispense Refill Last Dose  . famotidine (PEPCID) 20 MG tablet Take 1 tablet (20 mg total) by mouth 2 (two) times daily. (Patient taking differently: Take 20 mg by mouth 2 (two) times daily as needed for heartburn. ) 60 tablet 3 Past Month at Unknown time  . sertraline (ZOLOFT) 50 MG tablet Take 1 tablet (50 mg total) by mouth daily. 30 tablet 3 04/20/2016 at Unknown time  . enoxaparin (LOVENOX) 100 MG/ML injection Inject 0.85 mLs (85 mg total) into the skin every 12 (twelve) hours. 60 Syringe 4 04/14/2016 at 2300        Review of Systems   Constitutional: Negative for fever and chills Eyes: Negative for visual disturbances Respiratory: Negative for shortness of breath, dyspnea Cardiovascular: Negative for chest pain or palpitations  Gastrointestinal: Negative for vomiting, diarrhea and constipation.  POSITIVE for abdominal pain (contractions) Genitourinary: Negative for dysuria and urgency Musculoskeletal: Negative for back pain, joint pain, myalgias  Neurological: Negative for dizziness and headaches      Blood pressure 126/69, pulse 92, temperature 99.5 F (37.5 C), temperature source Oral, resp. rate 18, height 5'  4" (1.626 m), weight 90.3 kg (199 lb 1.9 oz), last menstrual period 08/05/2015. General appearance: alert, cooperative and no distress Lungs: clear to auscultation bilaterally Heart: regular rate and rhythm Abdomen: soft, non-tender; bowel sounds normal Extremities: Homans sign is negative, no sign of DVT DTR's 2+ Presentation: cephalic Fetal monitoring  Baseline: 145 bpm, Variability: Good {> 6 bpm), Accelerations: Reactive and Decelerations: Absent Uterine activity  3-4 minures Dilation: 3 Effacement (%): 60, 70 Station: -3 Exam by:: Ginnie Smart RN  Was 1/thick when arrived to MAU)  Prenatal labs: ABO, Rh: B/POS/--  (08/17 1406) Antibody: NEG (08/17 1406) Rubella: !Error!immun RPR: NON REAC (12/05 1435)  HBsAg: NEGATIVE (08/17 1406)  HIV: NONREACTIVE (12/05 1435)    Clinic  [facility] Prenatal Labs  Dating LMP consistent w 7 wk ultrasound Blood type: B/POS/-- (08/17 1406)   Genetic Screen 1 Screen: NT nml   AFP: OFFER    Quad:     NIPS: nl Antibody:NEG (08/17 1406)  Anatomic [place] Inadequate views of spine. Otherwise NL. Rubella: 2.43 (08/17 1406)  GTT Early:  68             Third trimester: nl 2 hr RPR: NON REAC (08/17 1406)   Flu vaccine  Declines HBsAg: NEGATIVE (08/17 1406)   TDaP vaccine  02/16/16                               Rhogam: HIV: NONREACTIVE (08/17 1406)   Baby Food     breast                                          GBS: neg  Contraception BTL (consent 02/16/16) vs progesterone only options Pap: hx abnml; colpo only; neg August 2017; neg HRHPV  Circumcision Yes    Pediatrician  Dr. Mayford Knife - Ped ([address])   Support Person  Dois Davenport (mother)      No results found for this or any previous visit (from the past 24 hour(s)).  Assessment: Nancy Francis with an IUP at [redacted]w[redacted]d presenting for early labor  Plan: #Labor: expectant management #Pain:  Per request #FWB Cat 1 #ICRESENZO-DISHMAN,Arden Tinoco 04/21/2016, 8:28 PM

## 2016-04-22 ENCOUNTER — Inpatient Hospital Stay (HOSPITAL_COMMUNITY): Payer: Medicaid Other | Admitting: Anesthesiology

## 2016-04-22 ENCOUNTER — Encounter: Payer: Medicaid Other | Admitting: Family Medicine

## 2016-04-22 ENCOUNTER — Encounter (HOSPITAL_COMMUNITY): Payer: Self-pay | Admitting: General Practice

## 2016-04-22 DIAGNOSIS — Z3A37 37 weeks gestation of pregnancy: Secondary | ICD-10-CM

## 2016-04-22 LAB — ABO/RH: ABO/RH(D): B POS

## 2016-04-22 MED ORDER — ONDANSETRON HCL 4 MG/2ML IJ SOLN: 4.0000 mg | INTRAMUSCULAR | Status: DC | PRN

## 2016-04-22 MED ORDER — SENNOSIDES-DOCUSATE SODIUM 8.6-50 MG PO TABS
2.0000 | ORAL_TABLET | ORAL | Status: DC
  Administered 2016-04-22 – 2016-04-23 (×2): 2 via ORAL
  Filled 2016-04-22 (×2): qty 2

## 2016-04-22 MED ORDER — ONDANSETRON HCL 4 MG PO TABS
4.0000 mg | ORAL_TABLET | ORAL | Status: DC | PRN
Start: 2016-04-22 — End: 2016-04-24

## 2016-04-22 MED ORDER — TETANUS-DIPHTH-ACELL PERTUSSIS 5-2.5-18.5 LF-MCG/0.5 IM SUSP: 0.5000 mL | Freq: Once | INTRAMUSCULAR | Status: DC

## 2016-04-22 MED ORDER — TERBUTALINE SULFATE 1 MG/ML IJ SOLN
0.2500 mg | Freq: Once | INTRAMUSCULAR | Status: DC | PRN
  Filled 2016-04-22: qty 1

## 2016-04-22 MED ORDER — SIMETHICONE 80 MG PO CHEW
80.0000 mg | CHEWABLE_TABLET | ORAL | Status: DC | PRN
Start: 2016-04-22 — End: 2016-04-24

## 2016-04-22 MED ORDER — SERTRALINE HCL 50 MG PO TABS
50.0000 mg | ORAL_TABLET | Freq: Every day | ORAL | Status: DC
  Administered 2016-04-23 – 2016-04-24 (×2): 50 mg via ORAL
  Filled 2016-04-22 (×3): qty 1

## 2016-04-22 MED ORDER — IBUPROFEN 600 MG PO TABS
600.0000 mg | ORAL_TABLET | Freq: Four times a day (QID) | ORAL | Status: DC
  Administered 2016-04-22 – 2016-04-24 (×7): 600 mg via ORAL
  Filled 2016-04-22 (×7): qty 1

## 2016-04-22 MED ORDER — WITCH HAZEL-GLYCERIN EX PADS: 1.0000 "application " | MEDICATED_PAD | CUTANEOUS | Status: DC | PRN

## 2016-04-22 MED ORDER — COCONUT OIL OIL: 1.0000 "application " | TOPICAL_OIL | Status: DC | PRN

## 2016-04-22 MED ORDER — OXYCODONE HCL 5 MG PO TABS
5.0000 mg | ORAL_TABLET | ORAL | Status: DC | PRN
  Administered 2016-04-23 – 2016-04-24 (×3): 5 mg via ORAL
  Filled 2016-04-22 (×3): qty 1

## 2016-04-22 MED ORDER — ZOLPIDEM TARTRATE 5 MG PO TABS: 5.0000 mg | ORAL_TABLET | Freq: Every evening | ORAL | Status: DC | PRN

## 2016-04-22 MED ORDER — DIPHENHYDRAMINE HCL 25 MG PO CAPS: 25.0000 mg | ORAL_CAPSULE | Freq: Four times a day (QID) | ORAL | Status: DC | PRN

## 2016-04-22 MED ORDER — BENZOCAINE-MENTHOL 20-0.5 % EX AERO: 1.0000 "application " | INHALATION_SPRAY | CUTANEOUS | Status: DC | PRN

## 2016-04-22 MED ORDER — OXYTOCIN 40 UNITS IN LACTATED RINGERS INFUSION - SIMPLE MED
1.0000 m[IU]/min | INTRAVENOUS | Status: DC
  Administered 2016-04-22: 2 m[IU]/min via INTRAVENOUS

## 2016-04-22 MED ORDER — PRENATAL MULTIVITAMIN CH
1.0000 | ORAL_TABLET | Freq: Every day | ORAL | Status: DC
  Administered 2016-04-23 – 2016-04-24 (×2): 1 via ORAL
  Filled 2016-04-22 (×2): qty 1

## 2016-04-22 MED ORDER — LIDOCAINE HCL (PF) 1 % IJ SOLN
INTRAMUSCULAR | Status: DC | PRN
  Administered 2016-04-22 (×2): 4 mL via EPIDURAL

## 2016-04-22 MED ORDER — ACETAMINOPHEN 325 MG PO TABS
650.0000 mg | ORAL_TABLET | ORAL | Status: DC | PRN
  Administered 2016-04-23 – 2016-04-24 (×3): 650 mg via ORAL
  Filled 2016-04-22 (×3): qty 2

## 2016-04-22 MED ORDER — DIBUCAINE 1 % RE OINT: 1.0000 "application " | TOPICAL_OINTMENT | RECTAL | Status: DC | PRN

## 2016-04-22 NOTE — Progress Notes (Signed)
Patient seen. Doing well. No other concerns. Cervix unchanged. Epidural in place. Start pitocin. Place SCDs plan to restart lovenox post partum.

## 2016-04-22 NOTE — Anesthesia Pain Management Evaluation Note (Signed)
  CRNA Pain Management Visit Note  Patient: Nancy Francis, 36 y.o., female  "Hello I am a member of the anesthesia team at [facility]. We have an anesthesia team available at all times to provide care throughout the hospital, including epidural management and anesthesia for C-section. I don't know your plan for the delivery whether it a natural birth, water birth, IV sedation, nitrous supplementation, doula or epidural, but we want to meet your pain goals."   1.Was your pain managed to your expectations on prior hospitalizations?   No   2.What is your expectation for pain management during this hospitalization?     Epidural  3.How can we help you reach that goal? Epidural in situ.  Record the patient's initial score and the patient's pain goal.   Pain: 1  Pain Goal: 5 [facility] wants you to be able to say your pain was always managed very well.  [name] L 04/22/2016

## 2016-04-22 NOTE — Anesthesia Procedure Notes (Signed)
Epidural Patient location during procedure: OB Start time: 04/22/2016 12:14 AM  Staffing Anesthesiologist: Mal AmabileFOSTER, Dakotah Heiman Performed: anesthesiologist   Preanesthetic Checklist Completed: patient identified, site marked, surgical consent, pre-op evaluation, timeout performed, IV checked, risks and benefits discussed and monitors and equipment checked  Epidural Patient position: sitting Prep: site prepped and draped and DuraPrep Patient monitoring: continuous pulse ox and blood pressure Approach: midline Location: L3-L4 Injection technique: LOR air  Needle:  Needle type: Tuohy  Needle gauge: 17 G Needle length: 9 cm and 9 Needle insertion depth: 5 cm cm Catheter type: closed end flexible Catheter size: 19 Gauge Catheter at skin depth: 10 cm Test dose: negative and Other  Assessment Events: blood not aspirated, injection not painful, no injection resistance, negative IV test and no paresthesia  Additional Notes Patient identified. Risks and benefits discussed including failed block, incomplete  Pain control, post dural puncture headache, nerve damage, paralysis, blood pressure Changes, nausea, vomiting, reactions to medications-both toxic and allergic and post Partum back pain. All questions were answered. Patient expressed understanding and wished to proceed. Sterile technique was used throughout procedure. Epidural site was Dressed with sterile barrier dressing. No paresthesias, signs of intravascular injection Or signs of intrathecal spread were encountered.  Patient was more comfortable after the epidural was dosed. Please see RN's note for documentation of vital signs and FHR which are stable.

## 2016-04-23 ENCOUNTER — Encounter (HOSPITAL_COMMUNITY): Payer: Self-pay | Admitting: *Deleted

## 2016-04-23 ENCOUNTER — Inpatient Hospital Stay (HOSPITAL_COMMUNITY): Payer: Medicaid Other | Admitting: Anesthesiology

## 2016-04-23 ENCOUNTER — Encounter (HOSPITAL_COMMUNITY)
Admission: AD | Disposition: A | Discharge status: Home / Self Care | Payer: Self-pay | Source: Ambulatory Visit | Attending: Obstetrics and Gynecology

## 2016-04-23 DIAGNOSIS — Z302 Encounter for sterilization: Secondary | ICD-10-CM

## 2016-04-23 HISTORY — PX: TUBAL LIGATION: SHX77

## 2016-04-23 LAB — RPR: RPR Ser Ql: NONREACTIVE

## 2016-04-23 SURGERY — LIGATION, FALLOPIAN TUBE, POSTPARTUM
Anesthesia: Monitor Anesthesia Care | Site: Abdomen | Laterality: Bilateral

## 2016-04-23 MED ORDER — LACTATED RINGERS IV SOLN
INTRAVENOUS | Status: DC | PRN
  Administered 2016-04-23: 09:00:00 via INTRAVENOUS

## 2016-04-23 MED ORDER — METOCLOPRAMIDE HCL 10 MG PO TABS
10.0000 mg | ORAL_TABLET | Freq: Once | ORAL | Status: AC
  Administered 2016-04-23: 10 mg via ORAL
  Filled 2016-04-23: qty 1

## 2016-04-23 MED ORDER — FENTANYL CITRATE (PF) 100 MCG/2ML IJ SOLN
INTRAMUSCULAR | Status: AC
  Filled 2016-04-23: qty 2

## 2016-04-23 MED ORDER — SODIUM BICARBONATE 8.4 % IV SOLN
INTRAVENOUS | Status: DC | PRN
  Administered 2016-04-23: 5 mL via EPIDURAL
  Administered 2016-04-23: 3 mL via EPIDURAL
  Administered 2016-04-23: 7 mL via EPIDURAL
  Administered 2016-04-23: 5 mL via EPIDURAL

## 2016-04-23 MED ORDER — FENTANYL CITRATE (PF) 100 MCG/2ML IJ SOLN: 25.0000 ug | INTRAMUSCULAR | Status: DC | PRN

## 2016-04-23 MED ORDER — ONDANSETRON HCL 4 MG/2ML IJ SOLN: 4.0000 mg | Freq: Four times a day (QID) | INTRAMUSCULAR | Status: DC | PRN

## 2016-04-23 MED ORDER — FAMOTIDINE 20 MG PO TABS
40.0000 mg | ORAL_TABLET | Freq: Once | ORAL | Status: AC
  Administered 2016-04-23: 40 mg via ORAL
  Filled 2016-04-23: qty 2

## 2016-04-23 MED ORDER — MIDAZOLAM HCL 2 MG/2ML IJ SOLN
INTRAMUSCULAR | Status: AC
  Filled 2016-04-23: qty 2

## 2016-04-23 MED ORDER — SODIUM BICARBONATE 8.4 % IV SOLN
INTRAVENOUS | Status: AC
  Filled 2016-04-23: qty 50

## 2016-04-23 MED ORDER — LACTATED RINGERS IV SOLN
INTRAVENOUS | Status: DC
  Administered 2016-04-23: 07:00:00 via INTRAVENOUS

## 2016-04-23 MED ORDER — ENOXAPARIN SODIUM 100 MG/ML ~~LOC~~ SOLN
1.0000 mg/kg | Freq: Two times a day (BID) | SUBCUTANEOUS | Status: DC
  Administered 2016-04-23 – 2016-04-24 (×2): 85 mg via SUBCUTANEOUS
  Filled 2016-04-23 (×4): qty 1

## 2016-04-23 MED ORDER — OXYCODONE HCL 5 MG PO TABS: 5.0000 mg | ORAL_TABLET | Freq: Once | ORAL | Status: DC | PRN

## 2016-04-23 MED ORDER — OXYCODONE HCL 5 MG PO TABS
5.0000 mg | ORAL_TABLET | Freq: Once | ORAL | Status: AC
  Administered 2016-04-23: 5 mg via ORAL
  Filled 2016-04-23: qty 1

## 2016-04-23 MED ORDER — LIDOCAINE-EPINEPHRINE (PF) 2 %-1:200000 IJ SOLN
INTRAMUSCULAR | Status: AC
  Filled 2016-04-23: qty 20

## 2016-04-23 MED ORDER — BUPIVACAINE HCL (PF) 0.25 % IJ SOLN
INTRAMUSCULAR | Status: DC | PRN
  Administered 2016-04-23: 10 mL

## 2016-04-23 MED ORDER — FENTANYL CITRATE (PF) 100 MCG/2ML IJ SOLN
INTRAMUSCULAR | Status: DC | PRN
  Administered 2016-04-23: 50 ug via INTRAVENOUS

## 2016-04-23 MED ORDER — MIDAZOLAM HCL 2 MG/2ML IJ SOLN
INTRAMUSCULAR | Status: DC | PRN
  Administered 2016-04-23: 2 mg via INTRAVENOUS

## 2016-04-23 MED ORDER — ONDANSETRON HCL 4 MG/2ML IJ SOLN
INTRAMUSCULAR | Status: DC | PRN
  Administered 2016-04-23: 4 mg via INTRAVENOUS

## 2016-04-23 MED ORDER — ONDANSETRON HCL 4 MG/2ML IJ SOLN
INTRAMUSCULAR | Status: AC
  Filled 2016-04-23: qty 2

## 2016-04-23 MED ORDER — OXYCODONE HCL 5 MG/5ML PO SOLN: 5.0000 mg | Freq: Once | ORAL | Status: DC | PRN

## 2016-04-23 SURGICAL SUPPLY — 20 items
CLIP FILSHIE TUBAL LIGA STRL (Clip) ×6 IMPLANT
CLOTH BEACON ORANGE TIMEOUT ST (SAFETY) ×3 IMPLANT
DRSG OPSITE POSTOP 3X4 (GAUZE/BANDAGES/DRESSINGS) ×3 IMPLANT
DURAPREP 26ML APPLICATOR (WOUND CARE) ×3 IMPLANT
GLOVE BIO SURGEON STRL SZ7 (GLOVE) ×3 IMPLANT
GLOVE BIOGEL PI IND STRL 7.0 (GLOVE) ×2 IMPLANT
GLOVE BIOGEL PI INDICATOR 7.0 (GLOVE) ×4
GOWN STRL REUS W/TWL LRG LVL3 (GOWN DISPOSABLE) ×6 IMPLANT
NEEDLE HYPO 22GX1.5 SAFETY (NEEDLE) ×3 IMPLANT
NS IRRIG 1000ML POUR BTL (IV SOLUTION) ×3 IMPLANT
PACK ABDOMINAL MINOR (CUSTOM PROCEDURE TRAY) ×3 IMPLANT
PROTECTOR NERVE ULNAR (MISCELLANEOUS) ×3 IMPLANT
SPONGE LAP 4X18 X RAY DECT (DISPOSABLE) IMPLANT
SUT VIC AB 0 CT1 27 (SUTURE) ×2
SUT VIC AB 0 CT1 27XBRD ANBCTR (SUTURE) ×1 IMPLANT
SUT VICRYL 4-0 PS2 18IN ABS (SUTURE) ×3 IMPLANT
SYR CONTROL 10ML LL (SYRINGE) ×3 IMPLANT
TOWEL OR 17X24 6PK STRL BLUE (TOWEL DISPOSABLE) ×6 IMPLANT
TRAY FOLEY CATH SILVER 14FR (SET/KITS/TRAYS/PACK) ×3 IMPLANT
WATER STERILE IRR 1000ML POUR (IV SOLUTION) ×3 IMPLANT

## 2016-04-23 NOTE — Anesthesia Postprocedure Evaluation (Signed)
Anesthesia Post Note  Patient: Nancy Francis  Procedure(s) Performed: * No procedures listed *  Patient location during evaluation: Mother Baby Anesthesia Type: Epidural Level of consciousness: awake and alert and oriented Pain management: satisfactory to patient Vital Signs Assessment: post-procedure vital signs reviewed and stable Respiratory status: spontaneous breathing and nonlabored ventilation Cardiovascular status: stable Postop Assessment: no headache, no backache, no signs of nausea or vomiting, adequate PO intake and patient able to bend at knees (patient up walking) Anesthetic complications: no        Last Vitals:  Vitals:   04/22/16 2356 04/23/16 0356  BP: 130/79 130/66  Pulse: (!) 105 97  Resp: 18 16  Temp: 36.6 C 36.5 C    Last Pain:  Vitals:   04/23/16 0743  TempSrc:   PainSc: 9    Pain Goal: Patients Stated Pain Goal: 3 (04/21/16 2338)               Madison HickmanGREGORY,[name]

## 2016-04-23 NOTE — Progress Notes (Signed)
Patient ID: Nancy Francis, female   DOB: 05/22/1980, 36 y.o.   MRN: [id number]   Patient desires permanent sterilization.  Other reversible forms of contraception were discussed with patient; she declines all other modalities. Risks of procedure discussed with patient including but not limited to: risk of regret, permanence of method, bleeding, infection, injury to surrounding organs and need for additional procedures.  Failure risk of 0.5-1% with increased risk of ectopic gestation if pregnancy occurs was also discussed with patient.    Patient verbalized understanding of these risks and wants to proceed with sterilization.  Written informed consent obtained.  To OR when ready.  Will start Lovenox 12 hour pp.

## 2016-04-23 NOTE — Progress Notes (Signed)
POSTPARTUM PROGRESS NOTE  Post Partum Day 1  Subjective:  Nancy Francis is a 36 y.o. [id number] 908w2d s/p SVD.  No acute events overnight.  Pt denies problems with ambulating, voiding or po intake.  She reports nausea or vomiting.  Pain is poorly controlled.  She rates her abdominal pain 9/10 and not better with ibuprofen.  She has had flatus. She has not had bowel movement.  Lochia Moderate.   Objective: Blood pressure 130/66, pulse 97, temperature 97.7 F (36.5 C), temperature source Oral, resp. rate 16, height 5\' 4"  (1.626 m), weight 89.8 kg (198 lb), last menstrual period 08/05/2015, SpO2 100 %, unknown if currently breastfeeding.  Physical Exam:  General: alert, cooperative and no distress Lochia:normal flow Chest: CTAB Heart: RRR no m/r/g Abdomen: +BS, soft, nontender,  Uterine Fundus: firm, below level of umbilicus DVT Evaluation: No calf swelling or tenderness Extremities: no lower extremity edema   Recent Labs  04/21/16 2055  HGB 9.6*  HCT 29.0*    Assessment/Plan:  ASSESSMENT: Nancy StanfordDominique Aime is a 36 y.o. [id number] 728w2d s/p SVD continuing to have uncontrolled pain overnight.  Otherwise doing well and awaiting BTL scheduled for this am.  Plan for discharge tomorrow, Breastfeeding and Contraception BTL today   LOS: 2 days  Satira AnisSung W Park, Medical Student 04/23/2016, 8:00 AM   CNM attestation Post Partum Day #1  Nancy Francis is a 36 y.o. [id number] s/p SVD.  Pt denies problems with ambulating, voiding or po intake. Pain is well controlled.  Plan for birth control is bilateral tubal ligation.  Method of Feeding: breast  PE:  BP 118/67 (BP Location: Left Arm)   Pulse 92   Temp 98.2 F (36.8 C) (Oral)   Resp 16   Ht 5\' 4"  (1.626 m)   Wt 89.8 kg (198 lb)   LMP 08/05/2015 (LMP Unknown)   SpO2 100%   Breastfeeding? Unknown   BMI 33.99 kg/m  Fundus firm  Plan for discharge: 04/24/16 For BTL later this morning  Cam HaiSHAW, Dal Blew, CNM 9:10 AM   04/23/2016

## 2016-04-23 NOTE — Transfer of Care (Signed)
Immediate Anesthesia Transfer of Care Note  Patient: Nancy Francis  Procedure(s) Performed: Procedure(s): POST PARTUM TUBAL LIGATION (Bilateral)  Patient Location: PACU  Anesthesia Type:Epidural  Level of Consciousness: awake  Airway & Oxygen Therapy: Patient Spontanous Breathing and Patient connected to nasal cannula oxygen  Post-op Assessment: Report given to RN and Post -op Vital signs reviewed and stable  Post vital signs: Reviewed  Last Vitals:  Vitals:   04/23/16 0356 04/23/16 0845  BP: 130/66 118/67  Pulse: 97 92  Resp: 16 16  Temp: 36.5 C 36.8 C    Last Pain:  Vitals:   04/23/16 0845  TempSrc: Oral  PainSc:       Patients Stated Pain Goal: 3 (04/21/16 2338)  Complications: No apparent anesthesia complications

## 2016-04-23 NOTE — Op Note (Signed)
Nancy StanfordDominique Francis 04/21/2016 - 04/23/2016  PREOPERATIVE DIAGNOSIS:  Multiparity, undesired fertility  POSTOPERATIVE DIAGNOSIS:  Multiparity, undesired fertility  PROCEDURE:  Postpartum Bilateral Tubal Sterilization using Filshie Clips   ANESTHESIA:  Epidural and local analgesia using 0.5% Bupivicaine  COMPLICATIONS:  None immediate.  ESTIMATED BLOOD LOSS:  10 ml.  INDICATIONS: 36 y.o. [id number]  with undesired fertility,status post vaginal delivery, desires permanent sterilization.  Other reversible forms of contraception were discussed with patient; she declines all other modalities. Risks of procedure discussed with patient including but not limited to: risk of regret, permanence of method, bleeding, infection, injury to surrounding organs and need for additional procedures.  Failure risk of 0.5-1% with increased risk of ectopic gestation if pregnancy occurs was also discussed with patient.     FINDINGS:  Normal uterus, tubes, and ovaries.  PROCEDURE DETAILS: The patient was taken to the operating room where her epidural anesthesia was dosed up to surgical level and found to be adequate.  She was then placed in the dorsal supine position and prepped and draped in sterile fashion.  After an adequate timeout was performed, attention was turned to the patient's abdomen where a small umbilical skin incision was made. The incision was taken down to the layer of fascia using the scalpel, and fascia was incised, and extended bilaterally using Mayo scissors. The peritoneum was entered in a sharp fashion. Attention was then turned to the patient's uterus, and left fallopian tube was identified and followed out to the fimbriated end.  A Filshie clip was placed on the left fallopian tube about 3 cm from the cornual attachment, with care given to incorporate the underlying mesosalpinx.  A similar process was carried out on the right side allowing for bilateral tubal sterilization.  Good hemostasis was noted  overall.The instruments were then removed from the patient's abdomen and the fascial incision was repaired with 0 Vicryl, and the skin was closed with a 4-0 Vicryl subcuticular stitch. Ten cc of 25% bupivacaine was injected into the subcutaneous tissue.  The patient tolerated the procedure well.  Instrument, sponge, and needle counts were correct times two.  The patient was then taken to the recovery room awake and in stable condition.  Nancy LincolnKelly Earla Francis 04/23/2016 11:03 AM

## 2016-04-23 NOTE — Anesthesia Postprocedure Evaluation (Addendum)
Anesthesia Post Note  Patient: Nancy Francis  Procedure(s) Performed: Procedure(s) (LRB): POST PARTUM TUBAL LIGATION (Bilateral)  Patient location during evaluation: Mother Baby Anesthesia Type: MAC Level of consciousness: awake and alert and oriented Pain management: satisfactory to patient Vital Signs Assessment: post-procedure vital signs reviewed and stable Respiratory status: spontaneous breathing and nonlabored ventilation Cardiovascular status: stable Postop Assessment: no headache, no backache, no signs of nausea or vomiting, adequate PO intake and patient able to bend at knees (patient up walking) Anesthetic complications: no        Last Vitals:  Vitals:   04/23/16 1130 04/23/16 1242  BP: 129/77 96/67  Pulse: 95 (!) 108  Resp: 16 16  Temp: 36.4 C 36.6 C    Last Pain:  Vitals:   04/23/16 1330  TempSrc:   PainSc: 8    Pain Goal: Patients Stated Pain Goal: 3 (04/23/16 1000)               Willa Rough

## 2016-04-23 NOTE — Addendum Note (Signed)
Addendum  created 04/23/16 1447 by Shanon PayorSuzanne M Doniel Maiello, CRNA   Sign clinical note

## 2016-04-23 NOTE — Anesthesia Preprocedure Evaluation (Signed)
Anesthesia Evaluation  Patient identified by MRN, date of birth, ID band Patient awake    Reviewed: Allergy & Precautions, H&P , NPO status , Patient's Chart, lab work & pertinent test results  Airway Mallampati: II   Neck ROM: full    Dental   Pulmonary former smoker, PE   breath sounds clear to auscultation       Cardiovascular + Peripheral Vascular Disease   Rhythm:regular Rate:Normal     Neuro/Psych PSYCHIATRIC DISORDERS Anxiety Depression    GI/Hepatic   Endo/Other    Renal/GU      Musculoskeletal   Abdominal   Peds  Hematology  (+) Blood dyscrasia, Sickle cell trait ,   Anesthesia Other Findings   Reproductive/Obstetrics                             Anesthesia Physical Anesthesia Plan  ASA: II  Anesthesia Plan: Epidural and MAC   Post-op Pain Management:    Induction: Intravenous  Airway Management Planned: Simple Face Mask  Additional Equipment:   Intra-op Plan:   Post-operative Plan:   Informed Consent: I have reviewed the patients History and Physical, chart, labs and discussed the procedure including the risks, benefits and alternatives for the proposed anesthesia with the patient or authorized representative who has indicated his/her understanding and acceptance.     Plan Discussed with: CRNA, Anesthesiologist and Surgeon  Anesthesia Plan Comments:         Anesthesia Quick Evaluation

## 2016-04-23 NOTE — Clinical Social Work Maternal (Signed)
  CLINICAL SOCIAL WORK MATERNAL/CHILD NOTE  Patient Details  Name: Nancy Francis MRN: [id number] Date of Birth: Aug 13, 1980  Date:  04/23/2016  Clinical Social Worker Initiating Note:  Ferdinand Lango Ashauna Bertholf, MSW, LCSW-A  Date/ Time Initiated:  04/23/16/1424     Child's Name:  Unknown to this Probation officer at this time.    Legal Guardian:  Other (Comment) (Not established by court system; MOB is primary caregiver at this time )   Need for Interpreter:  None   Date of Referral:  04/22/16     Reason for Referral:  Other (Comment) (MOB hx of anxiety/depression)   Referral Source:  RN   Address:  [place] [address]  Phone number:  [phone]   Household Members:  Self, Minor Children   Natural Supports (not living in the home):  Friends, Immediate Family, Extended Family   Professional Supports: Transport planner (Goes to [place] [facility] for outpatient medication maangement )   Employment:     Type of Work:     Education:      Pensions consultant:  [employer]   Other Resources:  [employer], Food Stamps    Cultural/Religious Considerations Which May Impact Care:  Non-Denominational per Presenter, broadcasting.   Strengths:  Ability to meet basic needs , Pediatrician chosen , Home prepared for child , Compliance with medical plan  ([facility] )   Risk Factors/Current Problems:  Other (Comment) (Hx of anxiety/depression; being treated with Zoloft )   Cognitive State:  Alert , Able to Concentrate , Goal Oriented , Insightful    Mood/Affect:  Calm , Comfortable , Interested    CSW Assessment: CSW met with MOB at bedside to complete assessment for consult regarding hx of anxiety/depression. At the time of this writers arrival MOB was laying in bed with a visitor at bedside and baby in basinet. With MOB's permission, this writer explained role and reasoning for visit. MOB was pleasant and engaging of this Probation officer and notes she does have a hx of depression/anxiety and is taking  zoloft to treat it. MOB notes she receives services at [facility] outpatient and plans to continue services. This Probation officer assessed MOB's feelings/emotions since baby's arrival. MOB notes she feels good just tired from laboring but otherwise normal. CSW inquired about MOB's hx of managing behavioral health symptoms. MOB notes she was previously not seeking treatment as often as she should but now is and notes symptoms have bene under control. CSW educated MOB on PPD being that she has a hx of depression. MOB verbalized understanding noting that she has never experienced PPD but is aware of what to do in the event that she does. This Probation officer inquired about MOB's substance hx. MOB notes she use to use substance very long ago; however, no longer does and does not have a desire to start back using. CSW praised MOB for her decision to stop engaging in substance use. MOB was thankful. This Probation officer discussed safe sleeping/SIDS. MOB verbalized understanding. A this time, MOB notes she has no further needs and/or concerns. CSW see's no barriers to d/c at this time, thus case closed to this CSW.   CSW Plan/Description:  Engineer, mining , No Further Intervention Required/No Barriers to Discharge, Information/Referral to [employer], MSW, [facility]  Office: [phone]

## 2016-04-23 NOTE — Anesthesia Postprocedure Evaluation (Signed)
Anesthesia Post Note  Patient: Lateya Dauria  Procedure(s) Performed: Procedure(s) (LRB): POST PARTUM TUBAL LIGATION (Bilateral)  Patient location during evaluation: PACU Anesthesia Type: MAC and Epidural Level of consciousness: awake and alert Pain management: pain level controlled Vital Signs Assessment: post-procedure vital signs reviewed and stable Respiratory status: spontaneous breathing, nonlabored ventilation and respiratory function stable Cardiovascular status: stable Postop Assessment: no headache, no backache and epidural receding Anesthetic complications: no Comments: Epidural catheter was removed at 10:45 am today.  If anticoagulation is to be restarted, wait 12 hours after time epidural was D/C'd before dosing.        Last Vitals:  Vitals:   04/23/16 1115 04/23/16 1130  BP: 121/82 129/77  Pulse: 99 95  Resp: 16 16  Temp: 36.7 C 36.4 C    Last Pain:  Vitals:   04/23/16 1130  TempSrc: Oral  PainSc:    Pain Goal: Patients Stated Pain Goal: 3 (04/23/16 1000)               [place]

## 2016-04-23 NOTE — Lactation Note (Signed)
This note was copied from a baby's chart. Lactation Consultation Note  Patient Name: Nancy Francis UJWJX'BToday's Date: 04/23/2016 Reason for consult: Initial assessment Baby at 20 hr of life. Mom denies breast or nipple pain. She bf her other children for 6wk then she went back to work. She stated "I done this before, can you just get me a hand pump". Mom reports the [employer] works best for her, she does not care for the DEBP. She was not rude but she was resistant to bf ed. She had a room full of visitors and stated she was tired. Did go over LPT infant feeding policy. Given lactation and LPT infant handouts. Aware of OP services and support group. She will call at the next feeding for lactation to observe a latch.    Maternal Data Has patient been taught Hand Expression?: Yes Does the patient have breastfeeding experience prior to this delivery?: Yes  Feeding Feeding Type: Breast Fed Length of feed: 15 min  LATCH Score/Interventions                      Lactation Tools Discussed/Used WIC Program: Yes Pump Review: Setup, frequency, and cleaning;Milk Storage Initiated by:: ES Date initiated:: 04/23/16   Consult Status Consult Status: Follow-up Date: 04/24/16 Follow-up type: In-patient    Rulon Eisenmengerlizabeth E Ryann Leavitt 04/23/2016, 5:04 PM

## 2016-04-24 ENCOUNTER — Encounter (HOSPITAL_COMMUNITY): Payer: Self-pay | Admitting: Obstetrics & Gynecology

## 2016-04-24 MED ORDER — ENOXAPARIN SODIUM 150 MG/ML ~~LOC~~ SOLN: 1.0000 mg/kg | Freq: Two times a day (BID) | SUBCUTANEOUS | 11 refills | Status: DC

## 2016-04-24 NOTE — Discharge Summary (Signed)
OB Discharge Summary  Patient Name: Nancy Francis DOB: 05/27/1980 MRN: [id number]  Date of admission: 04/21/2016 Delivering MD: Cam HaiSHAW, KIMBERLY D   Date of discharge: 04/24/2016  Admitting diagnosis: 37w pelvic pain and ctx desires sterilization Intrauterine pregnancy: 7519w2d     Secondary diagnosis:Active Problems:   Normal labor  Additional problems:none     Discharge diagnosis: Term Pregnancy Delivered and hx of DVT                                                                     Post partum procedures:BTL  Augmentation: n/a  Complications: None  Hospital course:  Onset of Labor With Vaginal Delivery     36 y.o. yo G5P5005 at 519w2d was admitted in Active Labor on 04/21/2016. Patient had an uncomplicated labor course as follows:  Membrane Rupture Time/Date: 6:02 PM ,04/22/2016   Intrapartum Procedures: Episiotomy: None [1]                                         Lacerations:  None [1]  Patient had a delivery of a Viable infant. 04/22/2016  Information for the patient's newborn:  Ethelle LyonStinson, Boy Lorean [[id number]][[id number]]  Delivery Method: Vaginal, Spontaneous Delivery (Filed from Delivery Summary)    Pateint had an uncomplicated postpartum course.  She is ambulating, tolerating a regular diet, passing flatus, and urinating well. Patient is discharged home in stable condition on 04/24/16.   Physical exam  Vitals:   04/23/16 2035 04/24/16 0020 04/24/16 0431 04/24/16 0558  BP: 110/72 105/70 124/67 113/61  Pulse: 92 88 91 91  Resp: 16 18 18 18   Temp: 98 F (36.7 C) 98.4 F (36.9 C) 97.9 F (36.6 C) 98.3 F (36.8 C)  TempSrc: Oral Oral Oral Oral  SpO2:      Weight:      Height:       General: alert, cooperative and no distress Lochia: appropriate Uterine Fundus: firm Incision: Healing well with no significant drainage, No significant erythema, Dressing is clean, dry, and intact DVT Evaluation: No evidence of DVT seen on physical exam. Labs: Lab Results  Component  Value Date   WBC 6.8 04/21/2016   HGB 9.6 (L) 04/21/2016   HCT 29.0 (L) 04/21/2016   MCV 88.1 04/21/2016   PLT 215 04/21/2016   CMP Latest Ref Rng & Units 02/03/2016  Glucose 65 - 99 mg/dL 84  BUN 6 - 20 mg/dL <8(M<5(L)  Creatinine 5.780.44 - 1.00 mg/dL 4.690.58  Sodium 629135 - 528145 mmol/L 136  Potassium 3.5 - 5.1 mmol/L 4.6  Chloride 101 - 111 mmol/L 106  CO2 22 - 32 mmol/L 20(L)  Calcium 8.9 - 10.3 mg/dL 9.8  Total Protein 6.5 - 8.1 g/dL 6.9  Total Bilirubin 0.3 - 1.2 mg/dL 0.3  Alkaline Phos 38 - 126 U/L 87  AST 15 - 41 U/L 12(L)  ALT 14 - 54 U/L 10(L)    Discharge instruction: per After Visit Summary and "Baby and Me Booklet".  After Visit Meds:  Allergies as of 04/24/2016      Reactions   Penicillins Hives   Has patient had a PCN reaction causing  immediate rash, facial/tongue/throat swelling, SOB or lightheadedness with hypotension: Yes Has patient had a PCN reaction causing severe rash involving mucus membranes or skin necrosis: Yes Has patient had a PCN reaction that required hospitalization Yes Has patient had a PCN reaction occurring within the last 10 years: No If all of the above answers are "NO", then may proceed with Cephalosporin use.      Medication List    STOP taking these medications   enoxaparin 100 MG/ML injection Commonly known as:  LOVENOX Replaced by:  enoxaparin 150 MG/ML injection     TAKE these medications   enoxaparin 150 MG/ML injection Commonly known as:  LOVENOX Inject 0.58 mLs (85 mg total) into the skin every 12 (twelve) hours. Replaces:  enoxaparin 100 MG/ML injection   famotidine 20 MG tablet Commonly known as:  PEPCID Take 1 tablet (20 mg total) by mouth 2 (two) times daily. What changed:  when to take this  reasons to take this   sertraline 50 MG tablet Commonly known as:  ZOLOFT Take 1 tablet (50 mg total) by mouth daily.       Diet: routine diet  Activity: Advance as tolerated. Pelvic rest for 6 weeks.   Outpatient follow  up:2 weeks Follow up Appt:No future appointments. Follow up visit: No Follow-up on file.  Postpartum contraception: Tubal Ligation  Newborn Data: Live born female  Birth Weight: 6 lb 12.5 oz (3075 g) APGAR: 8, 9  Baby Feeding: Breast Disposition:home with mother   04/24/2016 Wyvonnia Dusky, CNM

## 2016-04-24 NOTE — Lactation Note (Signed)
This note was copied from a baby's chart. Lactation Consultation Note  Baby in nursery for procedure.  Reviewed with mom that baby may needed EBM after feeding related to early term status. Discussed hand expressing and spoon feeding and explained process to mother. Phone number left for her so she can be taught how to spoon feed the baby.  Patient Name: Boy Eileen StanfordDominique Rendleman ZOXWR'UToday's Date: 04/24/2016 Reason for consult: Follow-up assessment   Maternal Data    Feeding Feeding Type: Breast Fed Length of feed: 20 min  LATCH Score/Interventions Latch: Grasps breast easily, tongue down, lips flanged, rhythmical sucking.  Audible Swallowing: A few with stimulation Intervention(s): Skin to skin  Type of Nipple: Everted at rest and after stimulation  Comfort (Breast/Nipple): Soft / non-tender     Hold (Positioning): No assistance needed to correctly position infant at breast.  LATCH Score: 9  Lactation Tools Discussed/Used     Consult Status Consult Status: PRN    Soyla DryerJoseph, Randle Shatzer 04/24/2016, 11:38 AM

## 2016-04-25 ENCOUNTER — Encounter (HOSPITAL_COMMUNITY): Payer: Self-pay

## 2016-04-25 ENCOUNTER — Encounter: Payer: Self-pay | Admitting: General Practice

## 2016-04-26 ENCOUNTER — Telehealth: Payer: Self-pay | Admitting: *Deleted

## 2016-04-26 NOTE — Telephone Encounter (Signed)
Patient left voicemail message on nurse voicemail on 04/25/16 at 1459.  States she went to the pharmacy - [facility] on [address] - to pick up her Lovenox and was told it wasn't approved yet.  Requests a return call to [phone].

## 2016-04-27 ENCOUNTER — Telehealth: Payer: Self-pay | Admitting: Obstetrics and Gynecology

## 2016-04-27 ENCOUNTER — Other Ambulatory Visit: Payer: Self-pay | Admitting: Obstetrics and Gynecology

## 2016-04-27 NOTE — Progress Notes (Signed)
Patient has not picked up Rx. She thinks it is not authorized;but a call to [facility] indicates that the Rx  Is available for pickup.

## 2016-04-27 NOTE — Telephone Encounter (Signed)
Per Medicaid pre authorization center, patient's prescription is authorized, brand name Lovenox preferred. Their records show that it was filled 2/13 with a one month supply. However this was cancelled and only a one day supply was given. Attempted to call patient, left voice mail to return my call. [facility] with [facility], who stated that the prescription was authorized and the patient was aware but hasn't picked it up yet.

## 2016-04-28 ENCOUNTER — Telehealth: Payer: Self-pay | Admitting: General Practice

## 2016-04-28 DIAGNOSIS — I82629 Acute embolism and thrombosis of deep veins of unspecified upper extremity: Secondary | ICD-10-CM

## 2016-04-28 NOTE — Telephone Encounter (Signed)
Patient called in to front office & left message stating she is having problems getting her lovenox rx. Called [facility] who states lovenox is available for pick up. Per Dr Penne LashLeggett, patient needs appt with pcp to manage anticoagulation therapy. Called [facility] and made appt for 2/23 @ 830. Called patient & informed her of medication available for pick up & appt info. Patient verbalized understanding to all & had no questions

## 2016-04-29 NOTE — Telephone Encounter (Signed)
Patient informed that she DOES have lovenox Rx waiting for her at the pharmacy

## 2016-05-04 ENCOUNTER — Telehealth: Payer: Self-pay | Admitting: *Deleted

## 2016-05-04 NOTE — Telephone Encounter (Signed)
Message left yesterday @ 1553 by Cy BlamerJeannie Church RN - [facility].  She stated that she had been to perform home visit for pt and baby.  Pt delivered on 2/9 and had BTS on 2/10.  Pt is experiencing abdominal discomfort, is tender to touch @ mid abdomen and was not given any pain meds post op. Pt has been taking tylenol with minimal relief. She does not have fever. Beverely LowJeannie stated that she advised pt to take Motrin OTC and to call our office if she was not feeling better.   I called pt and discussed that I had received a call from the home nurse.  Pt states that she ahs not taken ibuprofen because she has not been to the store. I advised that she take 600 mg of ibuprofen every 6 hrs for the next 24 hrs, then she may take 600 mg every 6 hrs as needed.  Pt advised to call us if she is not feeling better. She has appt @ [facility] on 2/23 for anticoagulation management and her abdomen can be examined at that time if not needed sooner.  Pt voiced understanding of all information and instructions given.

## 2016-05-06 ENCOUNTER — Encounter: Payer: Self-pay | Admitting: Licensed Clinical Social Worker

## 2016-05-06 ENCOUNTER — Encounter: Payer: Self-pay | Admitting: Family Medicine

## 2016-05-06 ENCOUNTER — Ambulatory Visit: Payer: Medicaid Other | Attending: Family Medicine | Admitting: Family Medicine

## 2016-05-06 VITALS — BP 114/71 | HR 62 | Temp 98.1°F | Resp 18 | Ht 64.0 in | Wt 186.4 lb

## 2016-05-06 DIAGNOSIS — F330 Major depressive disorder, recurrent, mild: Secondary | ICD-10-CM | POA: Diagnosis not present

## 2016-05-06 DIAGNOSIS — F53 Postpartum depression: Secondary | ICD-10-CM

## 2016-05-06 DIAGNOSIS — F32A Depression, unspecified: Secondary | ICD-10-CM | POA: Insufficient documentation

## 2016-05-06 DIAGNOSIS — O99345 Other mental disorders complicating the puerperium: Secondary | ICD-10-CM

## 2016-05-06 DIAGNOSIS — Z9851 Tubal ligation status: Secondary | ICD-10-CM

## 2016-05-06 DIAGNOSIS — M542 Cervicalgia: Secondary | ICD-10-CM | POA: Diagnosis not present

## 2016-05-06 DIAGNOSIS — F329 Major depressive disorder, single episode, unspecified: Secondary | ICD-10-CM | POA: Insufficient documentation

## 2016-05-06 MED ORDER — IBUPROFEN 800 MG PO TABS: 800.0000 mg | ORAL_TABLET | Freq: Three times a day (TID) | ORAL | 0 refills | Status: DC | PRN

## 2016-05-06 NOTE — Progress Notes (Signed)
Subjective:  Patient ID: Nancy Francis, female    DOB: 1980-05-06  Age: 36 y.o. MRN: [id number]  CC: Establish Care   HPI Nancy Francis presents for   1. History of Clinical depression: Zoloft prescribed since 2014 , initially at [facility] and was referred to [facility]. Reports being seen by a specialist regularly but stopped services in 2015 and medication. Reports dosage of Zoloft 100 mg before she stopped taking it. Also reports past use of Abilify which she stopped taking due to symptoms of drowsiness, she states "It made feel weird". Reports currently taking Zoloft 50 mg as prescribed.  2. Post partum depression: Denies any SI/HI. Denies any domestic violence. Has 5 children. Reports only source of support is her boyfriend who is also the father of her child. Reports her family is in [place]. Reports currently taking Zoloft 50 mg as prescribed. She agrees to speaking with the LCSW today.  3. History of tubal ligation: She states "When I turn I feel the sensation of something popping on the inside".  Reports history of recent nurse visit at home where she was advised to follow-up with her doctor or go to the ED. She denies any swelling, drainage, or bleeding from the incision site. She denies any constitutional symptoms.  4. Neck pain: 4 days. Difficulty with ROM when turning from right to left. Reports intermittent pain, 8/10 pain at its worse. Denies any history of neck injury. She reports stress. Reports history of epidural. Denies any headaches, back pain, or difficulty walking.   Outpatient Medications Prior to Visit  Medication Sig Dispense Refill  . enoxaparin (LOVENOX) 150 MG/ML injection Inject 0.58 mLs (85 mg total) into the skin every 12 (twelve) hours. 30 mL 11  . famotidine (PEPCID) 20 MG tablet Take 1 tablet (20 mg total) by mouth 2 (two) times daily. (Patient taking differently: Take 20 mg by mouth 2 (two) times daily as needed for heartburn. ) 60 tablet 3    . sertraline (ZOLOFT) 50 MG tablet Take 1 tablet (50 mg total) by mouth daily. 30 tablet 3   No facility-administered medications prior to visit.     ROS Review of Systems  Respiratory: Negative.   Cardiovascular: Negative.   Gastrointestinal: Positive for abdominal pain.  Musculoskeletal: Positive for neck pain.  Skin:       Surgical incision  Neurological: Negative.   Psychiatric/Behavioral:       History of depression and postpartum depression.    Objective:  BP 114/71 (BP Location: Left Arm, Patient Position: Sitting, Cuff Size: Normal)   Pulse 62   Temp 98.1 F (36.7 C) (Oral)   Resp 18   Ht 5\' 4"  (1.626 m)   Wt 186 lb 6.4 oz (84.6 kg)   SpO2 99%   BMI 32.00 kg/m   BP/Weight 05/06/2016 04/24/2016 04/23/2016  Systolic BP 114 113 -  Diastolic BP 71 61 -  Wt. (Lbs) 186.4 - 190.2  BMI 32 - 32.65    Physical Exam  Cardiovascular: Normal rate, regular rhythm, normal heart sounds and intact distal pulses.   Pulmonary/Chest: Effort normal and breath sounds normal.  Abdominal: Soft. Bowel sounds are normal.  Musculoskeletal:       Cervical back: She exhibits pain. She exhibits normal range of motion and no spasm.  Skin: Skin is warm and dry.     Psychiatric: She has a normal mood and affect. Her speech is normal and behavior is normal. She expresses no homicidal and no suicidal ideation. She  expresses no suicidal plans and no homicidal plans.  Nursing note and vitals reviewed.   Assessment & Plan:   Problem List Items Addressed This Visit      Other   Depression - Primary          -Keep on same dose of Zoloft 50 mg daily. Re-assess in 6 weeks.           -LCSW spoke with patient and provided resources.    Other Visit Diagnoses    Musculoskeletal neck pain       Relevant Medications   ibuprofen (ADVIL,MOTRIN) 800 MG tablet   Postpartum depression              -Keep on same dose of Zoloft 50 mg daily. Re-assess in 6 weeks.           -LCSW spoke with patient  and provided resources.        History of tubal ligation         -Patient encouraged to follow up with her ob/gyn or go to the ED.      Meds ordered this encounter  Medications  . ibuprofen (ADVIL,MOTRIN) 800 MG tablet    Sig: Take 1 tablet (800 mg total) by mouth every 8 (eight) hours as needed for moderate pain.    Dispense:  30 tablet    Refill:  0    Order Specific Question:   Supervising Provider    Answer:   Quentin AngstJEGEDE, OLUGBEMIGA E [[id number]    Follow-up: Return in about 6 weeks (around 06/17/2016) for Postpartum Depression.   Nancy BarkMandesia R Everado Pillsbury FNP

## 2016-05-06 NOTE — Patient Instructions (Addendum)
Call your ob/gyn Dr. Elsie Lincoln, MD office at [phone] concerning tubal ligation follow up . If symptoms worsen or fail to improve go to the ED.  Postpartum Depression and Baby Blues The postpartum period begins right after the birth of a baby. During this time, there is often a great amount of joy and excitement. It is also a time of many changes in the life of the parents. Regardless of how many times a mother gives birth, each child brings new challenges and dynamics to the family. It is not unusual to have feelings of excitement along with confusing shifts in moods, emotions, and thoughts. All mothers are at risk of developing postpartum depression or the "baby blues." These mood changes can occur right after giving birth, or they may occur many months after giving birth. The baby blues or postpartum depression can be mild or severe. Additionally, postpartum depression can go away rather quickly, or it can be a long-term condition. What are the causes? Raised hormone levels and the rapid drop in those levels are thought to be a main cause of postpartum depression and the baby blues. A number of hormones change during and after pregnancy. Estrogen and progesterone usually decrease right after the delivery of your baby. The levels of thyroid hormone and various cortisol steroids also rapidly drop. Other factors that play a role in these mood changes include major life events and genetics. What increases the risk? If you have any of the following risks for the baby blues or postpartum depression, know what symptoms to watch out for during the postpartum period. Risk factors that may increase the likelihood of getting the baby blues or postpartum depression include:  Having a personal or family history of depression.  Having depression while being pregnant.  Having premenstrual mood issues or mood issues related to oral contraceptives.  Having a lot of life stress.  Having marital  conflict.  Lacking a social support network.  Having a baby with special needs.  Having health problems, such as diabetes. What are the signs or symptoms? Symptoms of baby blues include:  Brief changes in mood, such as going from extreme happiness to sadness.  Decreased concentration.  Difficulty sleeping.  Crying spells, tearfulness.  Irritability.  Anxiety. Symptoms of postpartum depression typically begin within the first month after giving birth. These symptoms include:  Difficulty sleeping or excessive sleepiness.  Marked weight loss.  Agitation.  Feelings of worthlessness.  Lack of interest in activity or food. Postpartum psychosis is a very serious condition and can be dangerous. Fortunately, it is rare. Displaying any of the following symptoms is cause for immediate medical attention. Symptoms of postpartum psychosis include:  Hallucinations and delusions.  Bizarre or disorganized behavior.  Confusion or disorientation. How is this diagnosed? A diagnosis is made by an evaluation of your symptoms. There are no medical or lab tests that lead to a diagnosis, but there are various questionnaires that a health care provider may use to identify those with the baby blues, postpartum depression, or psychosis. Often, a screening tool called the [place] Postnatal Depression Scale is used to diagnose depression in the postpartum period. How is this treated? The baby blues usually goes away on its own in 1-2 weeks. Social support is often all that is needed. You will be encouraged to get adequate sleep and rest. Occasionally, you may be given medicines to help you sleep. Postpartum depression requires treatment because it can last several months or longer if it is not treated. Treatment  may include individual or group therapy, medicine, or both to address any social, physiological, and psychological factors that may play a role in the depression. Regular exercise, a healthy  diet, rest, and social support may also be strongly recommended. Postpartum psychosis is more serious and needs treatment right away. Hospitalization is often needed. Follow these instructions at home:  Get as much rest as you can. Nap when the baby sleeps.  Exercise regularly. Some women find yoga and walking to be beneficial.  Eat a balanced and nourishing diet.  Do little things that you enjoy. Have a cup of tea, take a bubble bath, read your favorite magazine, or listen to your favorite music.  Avoid alcohol.  Ask for help with household chores, cooking, grocery shopping, or running errands as needed. Do not try to do everything.  Talk to people close to you about how you are feeling. Get support from your partner, family members, friends, or other new moms.  Try to stay positive in how you think. Think about the things you are grateful for.  Do not spend a lot of time alone.  Only take over-the-counter or prescription medicine as directed by your health care provider.  Keep all your postpartum appointments.  Let your health care provider know if you have any concerns. Contact a health care provider if: You are having a reaction to or problems with your medicine. Get help right away if:  You have suicidal feelings.  You think you may harm the baby or someone else. This information is not intended to replace advice given to you by your health care provider. Make sure you discuss any questions you have with your health care provider. Document Released: 12/03/2003 Document Revised: 08/06/2015 Document Reviewed: 12/10/2012 Elsevier Interactive Patient Education  2017 Elsevier Inc.   Postpartum Tubal Ligation Postpartum tubal ligation (PPTL) is a procedure to close the fallopian tubes. This is done so that you cannot get pregnant. When the fallopian tubes are closed, the eggs that the ovaries release cannot enter the uterus, and sperm cannot reach the eggs. PPTL is done right  after childbirth or 1-2 days after childbirth, before the uterus returns to its normal location. PPTL is sometimes called "getting your tubes tied." You should not have this procedure if you want to get pregnant someday or if you are unsure about having more children. Tell a health care provider about:  Any allergies you have.  All medicines you are taking, including vitamins, herbs, eye drops, creams, and over-the-counter medicines.  Previous problems you or members of your family have had with the use of anesthetics.  Any blood disorders you have.  Previous surgeries you have had.  Any medical conditions you may have.  Any past pregnancies. What are the risks? Generally, this is a safe procedure. However, problems may occur, including:  Infection.  Bleeding.  Injury to surrounding organs.  Side effects from anesthetics.  Failure of the procedure. This procedure can increase your risk of a kind of pregnancy in which a fertilized egg attaches to the outside of the uterus (ectopic pregnancy). What happens before the procedure?  Ask your health care provider about:  How much pain you can expect to have.  What medicines you will be given for pain, especially if you are planning to breastfeed.  Follow instructions from your health care provider about eating and drinking restrictions. What happens during the procedure? If you had a vaginal delivery:  You may be given one or more of the  following:  A medicine that helps you relax (sedative).  A medicine to numb the area (local anesthetic).  A medicine to make you fall asleep (general anesthetic).  A medicine that is injected into an area of your body to numb everything below the injection site (regional anesthetic).  If you have been given a general anesthetic, a tube will be put down your throat to help you breathe.  An IV tube will be inserted into one of your veins to give you medicines and fluids during the  procedure.  Your bladder may be emptied with a small tube (catheter).  An incision will be made just below your belly button.  Your fallopian tubes will be located and brought up through the incision.  Your fallopian tubes will be tied off, burned (cauterized), or blocked with a clip, ring, or clamp. A small portion in the center of each fallopian tube may be removed.  The incision will be closed with stitches (sutures).  A bandage (dressing) will be placed over the incision. If you had a cesarean delivery:  Tubal ligation will be done through the incision that was used for the cesarean delivery of your baby.  The incision will be closed with sutures.  A dressing will be placed over the incision. The procedure may vary among health care providers and hospitals. What happens after the procedure?  Your blood pressure, heart rate, breathing rate, and blood oxygen level will be monitored often until the medicines you were given have worn off.  You will be given pain medicine as needed.  Do not drive for 24 hours if you received a sedative. This information is not intended to replace advice given to you by your health care provider. Make sure you discuss any questions you have with your health care provider. Document Released: 02/28/2005 Document Revised: 08/03/2015 Document Reviewed: 02/08/2015 Elsevier Interactive Patient Education  2017 [employer].

## 2016-05-06 NOTE — Progress Notes (Signed)
Patient is here for establish care  Patient is here for postpartum/ depression  Patient complains neck pain since 3-4 days ago  Patient complains about stomach pain where they did an incision

## 2016-05-06 NOTE — BH Specialist Note (Signed)
Session Start time: 9:30 AM   End Time: 10:00 AM Total Time:  30 minutes Type of Service: [place] Interpreter: No.   Interpreter Name & Language: N/A # [facility] Visits July 2017-June 2018: 1st   SUBJECTIVE: Nancy Francis is a 36 y.o. female  Pt. was referred by FNP [place] for:  anxiety and depression. Pt. reports the following symptoms/concerns: overwhelming feelings of sadness and worry, difficulty sleeping, inability to concentrate, racing thoughts, irritability, episodes of crying, and history of trauma Duration of problem:  2014 Severity: mild Previous treatment: Pt received therapy and medication management through [facility] (2015/2016) She was recently prescribed Zoloft by GYN and met with Outpatient LCSW, Vesta Mixer   OBJECTIVE: Mood: Pleasant & Affect: Appropriate Risk of harm to self or others: Pt denied SI/HI/AVH Assessments administered: PHQ-9; GAD-7  LIFE CONTEXT:  Family & Social: Pt resides with her boyfriend and her five minor children (ages 66, 54, 29, 81, and an infant) She receives emotional support from a cousin who resides nearby 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 She receives section eight, food stamps ($600), and medicaid Self-Care: Pt reports smoking cigarettes (1-2 daily) and occasional alcohol use Life changes: Pt recently gave birth to her fifth child. She has hx of post partum depression What is important to pt/family (values): Family   GOALS ADDRESSED:  Decrease symptoms of depression Decrease symptoms of anxiety Increase knowledge of coping skills Increase adequate support system for patient  INTERVENTIONS: Solution Focused, Strength-based and Supportive   ASSESSMENT:  Pt currently experiencing depression and anxiety. She reports overwhelming feelings of sadness and worry, difficulty sleeping, inability to concentrate, racing thoughts, irritability, episodes of crying, and history of trauma. Pt used to receive  behavioral health services in 2015 through [facility]; however, discontinued services shortly afterwards. Pt may benefit from psychotherapy and medication management. LCSWA educated pt on the cycle of depression and anxiety and discussed how psychosocial stressors can negatively impact one's physical and mental health. LCSWA discussed benefits of applying healthy coping skills to decrease symptoms and pt identified coping strategies to utilize on a daily basis. Pt was provided resources for postpartum support groups, crisis intervention, therapy, and medication management.     PLAN: 1. F/U with behavioral health clinician: Pt was encouraged to contact LCSWA if symptoms worsen or fail to improve to schedule behavioral appointments at [facility]. 2. Behavioral Health meds: Zoloft 3. Behavioral recommendations: LCSWA recommends that pt apply healthy coping skills discussed, continue with medication management, and utilize resources as needed. Pt is encouraged to schedule follow up appointment with LCSWA 4. Referral: Brief Counseling/Psychotherapy, [employer], Problem-solving teaching/coping strategies, Psychoeducation and Supportive Counseling 5. From scale of 1-10, how likely are you to follow plan: [place], MSW, [place] Worker 05/09/16 4:14 PM  Warmhandoff:   Warm Hand Off Completed.

## 2016-05-20 ENCOUNTER — Encounter: Payer: Self-pay | Admitting: Family Medicine

## 2016-05-20 ENCOUNTER — Other Ambulatory Visit: Payer: Self-pay | Admitting: Family Medicine

## 2016-05-20 ENCOUNTER — Ambulatory Visit: Payer: Medicaid Other | Admitting: Family Medicine

## 2016-05-23 ENCOUNTER — Ambulatory Visit (HOSPITAL_BASED_OUTPATIENT_CLINIC_OR_DEPARTMENT_OTHER): Payer: Medicaid Other | Admitting: Family Medicine

## 2016-05-23 ENCOUNTER — Other Ambulatory Visit: Payer: Self-pay | Admitting: Family Medicine

## 2016-05-23 ENCOUNTER — Ambulatory Visit: Payer: Medicaid Other | Attending: Family Medicine

## 2016-05-23 VITALS — BP 130/74 | HR 96 | Temp 98.3°F | Resp 18 | Ht 64.0 in | Wt 192.0 lb

## 2016-05-23 DIAGNOSIS — Z86711 Personal history of pulmonary embolism: Secondary | ICD-10-CM | POA: Insufficient documentation

## 2016-05-23 DIAGNOSIS — Z7901 Long term (current) use of anticoagulants: Secondary | ICD-10-CM

## 2016-05-23 DIAGNOSIS — Z86718 Personal history of other venous thrombosis and embolism: Secondary | ICD-10-CM | POA: Diagnosis not present

## 2016-05-23 DIAGNOSIS — Z5181 Encounter for therapeutic drug level monitoring: Secondary | ICD-10-CM | POA: Insufficient documentation

## 2016-05-23 DIAGNOSIS — M79602 Pain in left arm: Secondary | ICD-10-CM | POA: Diagnosis not present

## 2016-05-23 DIAGNOSIS — M7989 Other specified soft tissue disorders: Secondary | ICD-10-CM | POA: Diagnosis not present

## 2016-05-23 DIAGNOSIS — Z79899 Other long term (current) drug therapy: Secondary | ICD-10-CM | POA: Insufficient documentation

## 2016-05-23 LAB — POCT INR: INR: 0.9

## 2016-05-23 MED ORDER — RIVAROXABAN 20 MG PO TABS: 20.0000 mg | ORAL_TABLET | Freq: Every day | ORAL | 2 refills | Status: DC

## 2016-05-23 NOTE — Progress Notes (Signed)
Patient is here for f/up  

## 2016-05-23 NOTE — Progress Notes (Signed)
Subjective:  Patient ID: Nancy Francis, female    DOB: 11/14/1980  Age: 36 y.o. MRN: [id number]  CC: No chief complaint on file.   HPI Nancy Francis presents for   Anticoagulation: Started taking Lovenox August 2017 for  DVT. History of PE in 2014. Reports only taking Lovenox once a day due to disliking administering injections. Reports having one box of Lovenox left and only being approved 30 day supply. History of taking Xarelto for 6 months after being diagnosed with PE. Reports Kathryne HitchXaretlo was well tolerated.  Extremity swelling: Right leg swelling since postpartum. Reports has decreased some but is still present. History of ankle injury with surgery of ankle 2010 with parathesias. No coolness or increased warmth.   Left arm pain: Pain in left arm with lump for couple days. Reports history of DVT in LUE in the past. Denies any increased warmth or swelling. Denies any history of injury.    Outpatient Medications Prior to Visit  Medication Sig Dispense Refill  . famotidine (PEPCID) 20 MG tablet Take 1 tablet (20 mg total) by mouth 2 (two) times daily. (Patient taking differently: Take 20 mg by mouth 2 (two) times daily as needed for heartburn. ) 60 tablet 3  . ibuprofen (ADVIL,MOTRIN) 800 MG tablet Take 1 tablet (800 mg total) by mouth every 8 (eight) hours as needed for moderate pain. 30 tablet 0  . sertraline (ZOLOFT) 50 MG tablet Take 1 tablet (50 mg total) by mouth daily. 30 tablet 3  . enoxaparin (LOVENOX) 150 MG/ML injection Inject 0.58 mLs (85 mg total) into the skin every 12 (twelve) hours. 30 mL 11   No facility-administered medications prior to visit.     ROS Review of Systems  Respiratory: Negative.   Cardiovascular: Negative.   Gastrointestinal: Negative.   Musculoskeletal: Positive for myalgias (left arm).  Skin:       Right lower leg swelling  Hematological: Negative.     Objective:  BP 130/74 (BP Location: Left Arm, Patient Position: Sitting, Cuff Size:  Normal)   Pulse 96   Temp 98.3 F (36.8 C) (Oral)   Resp 18   Ht 5\' 4"  (1.626 m)   Wt 192 lb (87.1 kg)   SpO2 100%   BMI 32.96 kg/m   BP/Weight 05/23/2016 05/06/2016 04/24/2016  Systolic BP 130 114 113  Diastolic BP 74 71 61  Wt. (Lbs) 192 186.4 -  BMI 32.96 32 -    Physical Exam  Cardiovascular: Normal rate, regular rhythm, normal heart sounds and intact distal pulses.   Pulses:      Radial pulses are 2+ on the right side, and 2+ on the left side.       Dorsalis pedis pulses are 2+ on the right side, and 2+ on the left side.  Pulmonary/Chest: Effort normal and breath sounds normal.  Abdominal: Soft. Bowel sounds are normal.  Musculoskeletal:       Left forearm: She exhibits no swelling.       Right lower leg: She exhibits swelling (non-pitting ; lower right leg & ankle).  Skin: Skin is warm and dry. No bruising noted.  Nursing note and vitals reviewed.  Assessment & Plan:   Problem List Items Addressed This Visit    None    Visit Diagnoses    Anticoagulant long-term use    -  Primary   Relevant Medications   rivaroxaban (XARELTO) 20 MG TABS tablet   Other Relevant Orders   INR (Completed)   VAS US LOWER  EXTREMITY VENOUS (DVT) (Completed)   CBC with Differential   APTT   History of DVT (deep vein thrombosis)       Relevant Medications   rivaroxaban (XARELTO) 20 MG TABS tablet   Other Relevant Orders   INR (Completed)   VAS [place] LOWER EXTREMITY VENOUS (DVT) (Completed)   CBC with Differential   APTT   History of pulmonary embolus (PE)       Relevant Medications   rivaroxaban (XARELTO) 20 MG TABS tablet   Other Relevant Orders   INR (Completed)   CBC with Differential   APTT   Right leg swelling       Relevant Orders   VAS [place] LOWER EXTREMITY VENOUS (DVT) (Completed)   Left arm pain       Relevant Orders   VAS [place] UPPER EXTREMITY VENOUS DUPLEX (Completed)      Meds ordered this encounter  Medications  . rivaroxaban (XARELTO) 20 MG TABS tablet    Sig:  Take 1 tablet (20 mg total) by mouth daily with supper.    Dispense:  30 tablet    Refill:  2    Order Specific Question:   Supervising Provider    Answer:   Quentin Angst [id number]    Follow-up: Return if symptoms worsen or fail to improve. Return in about 1 month (around 06/23/2016), for Anticoagulation follow up.   Lizbeth Bark FNP

## 2016-05-23 NOTE — Patient Instructions (Addendum)
Rivaroxaban oral tablets °What is this medicine? °RIVAROXABAN (ri va ROX a ban) is an anticoagulant (blood thinner). It is used to treat blood clots in the lungs or in the veins. It is also used after knee or hip surgeries to prevent blood clots. It is also used to lower the chance of stroke in people with a medical condition called atrial fibrillation. °This medicine may be used for other purposes; ask your health care provider or pharmacist if you have questions. °COMMON BRAND NAME(S): Xarelto, Xarelto Starter Pack °What should I tell my health care provider before I take this medicine? °They need to know if you have any of these conditions: °-bleeding disorders °-bleeding in the brain °-blood in your stools (black or tarry stools) or if you have blood in your vomit °-history of stomach bleeding °-kidney disease °-liver disease °-low blood counts, like low white cell, platelet, or red cell counts °-recent or planned spinal or epidural procedure °-take medicines that treat or prevent blood clots °-an unusual or allergic reaction to rivaroxaban, other medicines, foods, dyes, or preservatives °-pregnant or trying to get pregnant °-breast-feeding °How should I use this medicine? °Take this medicine by mouth with a glass of water. Follow the directions on the prescription label. Take your medicine at regular intervals. Do not take it more often than directed. Do not stop taking except on your doctor's advice. Stopping this medicine may increase your risk of a blood clot. Be sure to refill your prescription before you run out of medicine. °If you are taking this medicine after hip or knee replacement surgery, take it with or without food. If you are taking this medicine for atrial fibrillation, take it with your evening meal. If you are taking this medicine to treat blood clots, take it with food at the same time each day. If you are unable to swallow your tablet, you may crush the tablet and mix it in applesauce. Then,  immediately eat the applesauce. You should eat more food right after you eat the applesauce containing the crushed tablet. °Talk to your pediatrician regarding the use of this medicine in children. Special care may be needed. °Overdosage: If you think you have taken too much of this medicine contact a poison control center or emergency room at once. °NOTE: This medicine is only for you. Do not share this medicine with others. °What if I miss a dose? °If you take your medicine once a day and miss a dose, take the missed dose as soon as you remember. If you take your medicine twice a day and miss a dose, take the missed dose immediately. In this instance, 2 tablets may be taken at the same time. The next day you should take 1 tablet twice a day as directed. °What may interact with this medicine? °Do not take this medicine with any of the following medications: °-defibrotide °This medicine may also interact with the following medications: °-aspirin and aspirin-like medicines °-certain antibiotics like erythromycin, azithromycin, and clarithromycin °-certain medicines for fungal infections like ketoconazole and itraconazole °-certain medicines for irregular heart beat like amiodarone, quinidine, dronedarone °-certain medicines for seizures like carbamazepine, phenytoin °-certain medicines that treat or prevent blood clots like warfarin, enoxaparin, and dalteparin °-conivaptan °-diltiazem °-felodipine °-indinavir °-lopinavir; ritonavir °-NSAIDS, medicines for pain and inflammation, like ibuprofen or naproxen °-ranolazine °-rifampin °-ritonavir °-SNRIs, medicines for depression, like desvenlafaxine, duloxetine, levomilnacipran, venlafaxine °-SSRIs, medicines for depression, like citalopram, escitalopram, fluoxetine, fluvoxamine, paroxetine, sertraline °-St. John's wort °-verapamil °This list may not describe all   possible interactions. Give your health care provider a list of all the medicines, herbs, non-prescription  drugs, or dietary supplements you use. Also tell them if you smoke, drink alcohol, or use illegal drugs. Some items may interact with your medicine. What should I watch for while using this medicine? Visit your doctor or health care professional for regular checks on your progress. Notify your doctor or health care professional and seek emergency treatment if you develop breathing problems; changes in vision; chest pain; severe, sudden headache; pain, swelling, warmth in the leg; trouble speaking; sudden numbness or weakness of the face, arm or leg. These can be signs that your condition has gotten worse. If you are going to have surgery or other procedure, tell your doctor that you are taking this medicine. What side effects may I notice from receiving this medicine? Side effects that you should report to your doctor or health care professional as soon as possible: -allergic reactions like skin rash, itching or hives, swelling of the face, lips, or tongue -back pain -redness, blistering, peeling or loosening of the skin, including inside the mouth -signs and symptoms of bleeding such as bloody or black, tarry stools; red or dark-brown urine; spitting up blood or brown material that looks like coffee grounds; red spots on the skin; unusual bruising or bleeding from the eye, gums, or nose Side effects that usually do not require medical attention (report to your doctor or health care professional if they continue or are bothersome): -dizziness -muscle pain This list may not describe all possible side effects. Call your doctor for medical advice about side effects. You may report side effects to FDA at 1-800-FDA-1088. Where should I keep my medicine? Keep out of the reach of children. Store at room temperature between 15 and 30 degrees C (59 and 86 degrees F). Throw away any unused medicine after the expiration date. NOTE: This sheet is a summary. It may not cover all possible information. If you  have questions about this medicine, talk to your doctor, pharmacist, or health care provider.  2018 Elsevier/Gold Standard (2015-11-18 16:29:33)  Bleeding Precautions When on Anticoagulant Therapy WHAT IS ANTICOAGULANT THERAPY? Anticoagulant therapy is taking medicine to prevent or reduce blood clots. It is also called blood thinner therapy. Blood clots that form in your blood vessels can be dangerous. They can break loose and travel to your heart, lungs, or brain. This increases your risk of a heart attack or stroke. Anticoagulant therapy causes blood to clot more slowly. You may need anticoagulant therapy if you have:  A medical condition that increases the likelihood that blood clots will form.  A heart defect or a problem with heart rhythm. It is also a common treatment after heart surgery, such as valve replacement. WHAT ARE COMMON TYPES OF ANTICOAGULANT THERAPY? Anticoagulant medicine can be injected or taken by mouth.If you need anticoagulant therapy quickly at the hospital, the medicine may be injected under your skin or given through an IV tube. Heparin is a common example of an anticoagulant that you may get at the hospital. Most anticoagulant therapy is in the form of pills that you take at home every day. These may include:  Aspirin. This common blood thinner works by preventing blood cells (platelets) from sticking together to form a clot. Aspirin is not as strong as anticoagulants that slow down the time that it takes for your body to form a clot.  Clopidogrel. This is a newer type of drug that affects platelets. It is stronger  than aspirin.  Warfarin. This is the most common anticoagulant. It changes the way your body uses vitamin K, a vitamin that helps your blood to clot. The risk of bleeding is higher with warfarin than with aspirin. You will need frequent blood tests to make sure you are taking the safest amount.  New anticoagulants. Several new drugs have been approved.  They are all taken by mouth. Studies show that these drugs work as well as warfarin. They do not require blood testing. They may cause less bleeding risk than warfarin. WHAT DO I NEED TO REMEMBER WHEN TAKING ANTICOAGULANT THERAPY? Anticoagulant therapy decreases your risk of forming a blood clot, but it increases your risk of bleeding. Work closely with your health care provider to make sure you are taking your medicine safely. These tips can help:  Learn ways to reduce your risk of bleeding.  If you are taking warfarin:  Have blood tests as ordered by your health care provider.  Do not make any sudden changes to your diet. Vitamin K in your diet can make warfarin less effective.  Do not get pregnant. This medicine may cause birth defects.  Take your medicine at the same time every day. If you forget to take your medicine, take it as soon as you remember. If you miss a whole day, do not double your dose of medicine. Take your normal dose and call your health care provider to check in.  Do not stop taking your medicine on your own.  Tell your health care provider before you start taking any new medicine, vitamin, or herbal product. Some of these could interfere with your therapy.  Tell all of your health care providers that you are on anticoagulant therapy.  Do not have surgery, medical procedures, or dental work until you tell your health care provider that you are on anticoagulant therapy. WHAT CAN AFFECT HOW ANTICOAGULANTS WORK? Certain foods, vitamins, medicines, supplements, and herbal medicines change the way that anticoagulant therapy works. They may increase or decrease the effects of your anticoagulant therapy. Either result can be dangerous for you.  Many over-the-counter medicines for pain, colds, or stomach problems interfere with anticoagulant therapy. Take these only as told by your health care provider.  Do not drink alcohol. It can interfere with your medicine and increase  your risk of an injury that causes bleeding.  If you are taking warfarin, do not begin eating more foods that contain vitamin K. These include leafy green vegetables. Ask your health care provider if you should avoid any foods. WHAT ARE SOME WAYS TO PREVENT BLEEDING? You can prevent bleeding by taking certain precautions:  Be extra careful when you use knives, scissors, or other sharp objects.  Use an electric razor instead of a blade.  Do not use toothpicks.  Use a soft toothbrush.  Wear shoes that have nonskid soles.  Use bath mats and handrails in your bathroom.  Wear gloves while you do yard work.  Wear a helmet when you ride a bike.  Wear your seat belt.  Prevent falls by removing loose rugs and extension cords from areas where you walk.  Do not play contact sports or participate in other activities that have a high risk of injury. WHEN SHOULD I CONTACT MY HEALTH CARE PROVIDER? Call your health care provider if:  You miss a dose of medicine:  And you are not sure what to do.  For more than one day.  You have:  Menstrual bleeding that is heavier than  normal.  Blood in your urine.  A bloody nose or bleeding gums.  Easy bruising.  Blood in your stool (feces) or have black and tarry stool.  Side effects from your medicine.  You feel weak or dizzy.  You become pregnant. Seek immediate medical care if:  You have bleeding that will not stop.  You have sudden and severe headache or belly pain.  You vomit or you cough up bright red blood.  You have a severe blow to your head. WHAT ARE SOME QUESTIONS TO ASK MY HEALTH CARE PROVIDER?  What is the best anticoagulant therapy for my condition?  What side effects should I watch for?  When should I take my medicine? What should I do if I forget to take it?  Will I need to have regular blood tests?  Do I need to change my diet? Are there foods or drinks that I should avoid?  What activities are safe for  me?  What should I do if I want to get pregnant? This information is not intended to replace advice given to you by your health care provider. Make sure you discuss any questions you have with your health care provider. Document Released: 02/09/2015 Document Reviewed: 02/09/2015 Elsevier Interactive Patient Education  2017 [employer].

## 2016-05-24 ENCOUNTER — Telehealth: Payer: Self-pay

## 2016-05-24 ENCOUNTER — Ambulatory Visit (HOSPITAL_COMMUNITY)
Admission: RE | Admit: 2016-05-24 | Discharge: 2016-05-24 | Disposition: A | Discharge status: Home / Self Care | Payer: Medicaid Other | Source: Ambulatory Visit | Attending: Family Medicine | Admitting: Family Medicine

## 2016-05-24 ENCOUNTER — Ambulatory Visit (HOSPITAL_BASED_OUTPATIENT_CLINIC_OR_DEPARTMENT_OTHER)
Admission: RE | Admit: 2016-05-24 | Discharge: 2016-05-24 | Disposition: A | Discharge status: Home / Self Care | Payer: Medicaid Other | Source: Ambulatory Visit | Attending: Family Medicine | Admitting: Family Medicine

## 2016-05-24 DIAGNOSIS — Z86718 Personal history of other venous thrombosis and embolism: Secondary | ICD-10-CM

## 2016-05-24 DIAGNOSIS — M7989 Other specified soft tissue disorders: Secondary | ICD-10-CM | POA: Diagnosis present

## 2016-05-24 DIAGNOSIS — M79602 Pain in left arm: Secondary | ICD-10-CM | POA: Diagnosis present

## 2016-05-24 DIAGNOSIS — Z7901 Long term (current) use of anticoagulants: Secondary | ICD-10-CM | POA: Insufficient documentation

## 2016-05-24 NOTE — Telephone Encounter (Signed)
Vascular call with patient pulmonary results  DVT was negative of right leg & left arm

## 2016-05-24 NOTE — Progress Notes (Signed)
*  Preliminary Results* Left upper extremity venous duplex completed. Left upper extremity is negative for deep and superficial vein thrombosis.   Right lower extremity venous duplex completed. Right lower extremity is negative for deep vein thrombosis. There is no evidence of right Baker's cyst.  05/24/2016 11:46 AM  Gertie FeyMichelle Dakin Madani, BS, RVT, RDCS, RDMS

## 2016-05-25 ENCOUNTER — Telehealth: Payer: Self-pay

## 2016-05-25 NOTE — Telephone Encounter (Signed)
-----   Message from Lizbeth BarkMandesia R Hairston, FNP sent at 05/25/2016 12:22 PM EDT ----- Right lower leg and left arm were negative for any blood clots.

## 2016-05-25 NOTE — Telephone Encounter (Signed)
CMA call to go over results  Patient Verify DOB  Patient was aware and understood   

## 2016-05-30 ENCOUNTER — Ambulatory Visit: Payer: Medicaid Other | Admitting: Student

## 2016-07-20 ENCOUNTER — Other Ambulatory Visit: Payer: Medicaid Other | Admitting: Family Medicine

## 2016-07-20 NOTE — Progress Notes (Deleted)
   Subjective:  Patient ID: Nancy Francis, female    DOB: 11/10/1980  Age: 36 y.o. MRN: [id number]  CC: No chief complaint on file.   HPI Nancy Francis presents for   Well woman visit with pap:   Anticoagulation: Started taking Lovenox August 2017 for  DVT. History of PE in 2014. History of Lovenox injections in the past and was transitioned to Xarelto.    History of depression:  Outpatient Medications Prior to Visit  Medication Sig Dispense Refill  . famotidine (PEPCID) 20 MG tablet Take 1 tablet (20 mg total) by mouth 2 (two) times daily. (Patient taking differently: Take 20 mg by mouth 2 (two) times daily as needed for heartburn. ) 60 tablet 3  . ibuprofen (ADVIL,MOTRIN) 800 MG tablet Take 1 tablet (800 mg total) by mouth every 8 (eight) hours as needed for moderate pain. 30 tablet 0  . rivaroxaban (XARELTO) 20 MG TABS tablet Take 1 tablet (20 mg total) by mouth daily with supper. 30 tablet 2  . sertraline (ZOLOFT) 50 MG tablet Take 1 tablet (50 mg total) by mouth daily. 30 tablet 3   No facility-administered medications prior to visit.     ROS Review of Systems  Constitutional: Negative.   Respiratory: Negative.   Cardiovascular: Negative.   Gastrointestinal: Negative.   Genitourinary: Negative.   Hematological: Negative.     Objective:  There were no vitals taken for this visit.  BP/Weight 05/23/2016 05/06/2016 04/24/2016  Systolic BP 130 114 113  Diastolic BP 74 71 61  Wt. (Lbs) 192 186.4 -  BMI 32.96 32 -    Physical Exam  Cardiovascular: Normal rate, regular rhythm, normal heart sounds and intact distal pulses.   Pulmonary/Chest: Effort normal and breath sounds normal.  Abdominal: Soft. Bowel sounds are normal.  Skin: Skin is warm and dry.  Nursing note and vitals reviewed.  Assessment & Plan:   Problem List Items Addressed This Visit    None      No orders of the defined types were placed in this encounter.   Follow-up: Return if symptoms  worsen or fail to improve. Return in about 1 month (around 06/23/2016), for Anticoagulation follow up.   Lizbeth BarkMandesia R Jeanene Mena FNP

## 2016-07-26 ENCOUNTER — Other Ambulatory Visit: Payer: Medicaid Other | Admitting: Family Medicine

## 2016-07-26 NOTE — Progress Notes (Deleted)
   Subjective:  Patient ID: Nancy Francis, female    DOB: 09/19/1980  Age: 36 y.o. MRN: [id number]  CC: No chief complaint on file.   HPI Nancy StanfordDominique Bubar presents for ***  Outpatient Medications Prior to Visit  Medication Sig Dispense Refill  . famotidine (PEPCID) 20 MG tablet Take 1 tablet (20 mg total) by mouth 2 (two) times daily. (Patient taking differently: Take 20 mg by mouth 2 (two) times daily as needed for heartburn. ) 60 tablet 3  . ibuprofen (ADVIL,MOTRIN) 800 MG tablet Take 1 tablet (800 mg total) by mouth every 8 (eight) hours as needed for moderate pain. 30 tablet 0  . rivaroxaban (XARELTO) 20 MG TABS tablet Take 1 tablet (20 mg total) by mouth daily with supper. 30 tablet 2  . sertraline (ZOLOFT) 50 MG tablet Take 1 tablet (50 mg total) by mouth daily. 30 tablet 3   No facility-administered medications prior to visit.     ROS Review of Systems      Objective:  There were no vitals taken for this visit.  BP/Weight 05/23/2016 05/06/2016 04/24/2016  Systolic BP 130 114 113  Diastolic BP 74 71 61  Wt. (Lbs) 192 186.4 -  BMI 32.96 32 -     Physical Exam   Assessment & Plan:   Problem List Items Addressed This Visit    None      No orders of the defined types were placed in this encounter.   Follow-up: No Follow-up on file.   Lizbeth BarkMandesia R Nikki Rusnak FNP

## 2016-08-26 ENCOUNTER — Other Ambulatory Visit: Payer: Self-pay | Admitting: Family Medicine

## 2016-10-13 ENCOUNTER — Ambulatory Visit (HOSPITAL_COMMUNITY)
Admission: RE | Admit: 2016-10-13 | Discharge: 2016-10-13 | Disposition: A | Discharge status: Home / Self Care | Payer: Medicaid Other | Source: Ambulatory Visit | Attending: Emergency Medicine | Admitting: Emergency Medicine

## 2016-10-13 ENCOUNTER — Encounter (HOSPITAL_COMMUNITY): Payer: Self-pay

## 2016-10-13 ENCOUNTER — Emergency Department (HOSPITAL_COMMUNITY)
Admission: EM | Admit: 2016-10-13 | Discharge: 2016-10-13 | Discharge status: Against Medical Advice | Payer: Medicaid Other | Attending: Emergency Medicine | Admitting: Emergency Medicine

## 2016-10-13 DIAGNOSIS — Y939 Activity, unspecified: Secondary | ICD-10-CM | POA: Insufficient documentation

## 2016-10-13 DIAGNOSIS — M25512 Pain in left shoulder: Secondary | ICD-10-CM | POA: Insufficient documentation

## 2016-10-13 DIAGNOSIS — Y929 Unspecified place or not applicable: Secondary | ICD-10-CM | POA: Diagnosis not present

## 2016-10-13 DIAGNOSIS — S098XXA Other specified injuries of head, initial encounter: Secondary | ICD-10-CM | POA: Insufficient documentation

## 2016-10-13 DIAGNOSIS — Z5321 Procedure and treatment not carried out due to patient leaving prior to being seen by health care provider: Secondary | ICD-10-CM | POA: Insufficient documentation

## 2016-10-13 DIAGNOSIS — Y999 Unspecified external cause status: Secondary | ICD-10-CM | POA: Diagnosis not present

## 2016-10-13 NOTE — ED Notes (Signed)
Patient called in all areas of waiting room with no response.
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
## 2016-10-13 NOTE — ED Notes (Signed)
Called pt x3 for x-ray .Marland Kitchen. No answer

## 2016-11-02 ENCOUNTER — Encounter (HOSPITAL_COMMUNITY): Payer: Self-pay | Admitting: Obstetrics & Gynecology

## 2016-11-02 NOTE — Addendum Note (Signed)
Addendum  created 11/02/16 1148 by Evolet Salminen, MD   Sign clinical note    

## 2016-11-14 ENCOUNTER — Encounter (HOSPITAL_COMMUNITY): Payer: Self-pay

## 2016-11-14 ENCOUNTER — Emergency Department (HOSPITAL_COMMUNITY)
Admission: EM | Admit: 2016-11-14 | Discharge: 2016-11-14 | Disposition: A | Discharge status: Home / Self Care | Payer: Medicaid Other | Attending: Emergency Medicine | Admitting: Emergency Medicine

## 2016-11-14 DIAGNOSIS — N898 Other specified noninflammatory disorders of vagina: Secondary | ICD-10-CM | POA: Insufficient documentation

## 2016-11-14 DIAGNOSIS — Z87891 Personal history of nicotine dependence: Secondary | ICD-10-CM | POA: Diagnosis not present

## 2016-11-14 DIAGNOSIS — R197 Diarrhea, unspecified: Secondary | ICD-10-CM | POA: Diagnosis not present

## 2016-11-14 DIAGNOSIS — R102 Pelvic and perineal pain: Secondary | ICD-10-CM | POA: Diagnosis not present

## 2016-11-14 DIAGNOSIS — Z79899 Other long term (current) drug therapy: Secondary | ICD-10-CM | POA: Diagnosis not present

## 2016-11-14 LAB — WET PREP, GENITAL
CLUE CELLS WET PREP: NONE SEEN
Sperm: NONE SEEN
Trich, Wet Prep: NONE SEEN
Yeast Wet Prep HPF POC: NONE SEEN

## 2016-11-14 LAB — COMPREHENSIVE METABOLIC PANEL
ALBUMIN: 3.8 g/dL (ref 3.5–5.0)
ALT: 12 U/L — ABNORMAL LOW (ref 14–54)
ANION GAP: 7 (ref 5–15)
AST: 15 U/L (ref 15–41)
Alkaline Phosphatase: 80 U/L (ref 38–126)
BUN: 7 mg/dL (ref 6–20)
CHLORIDE: 108 mmol/L (ref 101–111)
CO2: 24 mmol/L (ref 22–32)
Calcium: 9.2 mg/dL (ref 8.9–10.3)
Creatinine, Ser: 0.85 mg/dL (ref 0.44–1.00)
GFR calc Af Amer: >60 mL/min (ref >60)
GFR calc non Af Amer: >60 mL/min (ref >60)
GLUCOSE: 93 mg/dL (ref 65–99)
POTASSIUM: 3.7 mmol/L (ref 3.5–5.1)
SODIUM: 139 mmol/L (ref 135–145)
TOTAL PROTEIN: 7.2 g/dL (ref 6.5–8.1)
Total Bilirubin: 0.7 mg/dL (ref 0.3–1.2)

## 2016-11-14 LAB — CBC
HEMATOCRIT: 33.1 % — AB (ref 36.0–46.0)
HEMOGLOBIN: 10.3 g/dL — AB (ref 12.0–15.0)
MCH: 27.2 pg (ref 26.0–34.0)
MCHC: 31.1 g/dL (ref 30.0–36.0)
MCV: 87.3 fL (ref 78.0–100.0)
Platelets: 309 10*3/uL (ref 150–400)
RBC: 3.79 MIL/uL — ABNORMAL LOW (ref 3.87–5.11)
RDW: 16.4 % — ABNORMAL HIGH (ref 11.5–15.5)
WBC: 4.8 10*3/uL (ref 4.0–10.5)

## 2016-11-14 LAB — URINALYSIS, ROUTINE W REFLEX MICROSCOPIC
Bilirubin Urine: NEGATIVE
GLUCOSE, UA: NEGATIVE mg/dL
HGB URINE DIPSTICK: NEGATIVE
Ketones, ur: NEGATIVE mg/dL
Leukocytes, UA: NEGATIVE
Nitrite: NEGATIVE
PH: 6 (ref 5.0–8.0)
Protein, ur: NEGATIVE mg/dL
SPECIFIC GRAVITY, URINE: 1.019 (ref 1.005–1.030)

## 2016-11-14 LAB — LIPASE, BLOOD: Lipase: 38 U/L (ref 11–51)

## 2016-11-14 LAB — POC URINE PREG, ED: PREG TEST UR: NEGATIVE

## 2016-11-14 NOTE — ED Provider Notes (Signed)
[facility]-EMERGENCY DEPT Provider Note   CSN: [id number] Arrival date & time: 11/14/16  0903     History   Chief Complaint Chief Complaint  Patient presents with  . Abdominal Pain    HPI Nancy Francis is a 36 y.o. female.  The history is provided by the patient. No language interpreter was used.  Abdominal Pain     Nancy Francis is a 36 y.o. female who presents to the Emergency Department complaining of abdominal pain. She presents for reevaluation of abdominal pain that started last night. She reports sharp and stabbing lower abdominal pain that has been intermittent in nature. This morning she awoke with foul-smelling vaginal discharge. She denies any fevers, nausea, vomiting, dysuria. She has experienced 2 weeks of diarrhea with decreased appetite. No prior similar symptoms. Past Medical History:  Diagnosis Date  . Abnormal Pap smear 2010   repeat was negative  . Anemia   . Anxiety   . Depression   . PE (pulmonary thromboembolism) (HCC)   . Sickle cell trait [facility])     Patient Active Problem List   Diagnosis Date Noted  . Depression 05/06/2016  . Normal labor 04/21/2016  . Adjustment disorder with depressed mood 01/16/2016  . Abnormal maternal serum screening test 11/23/2015  . Supervision of high risk pregnancy, antepartum 10/29/2015  . Hx Pulmonary embolism, currently pregnant, antepartum 10/29/2015  . DVT of upper extremity (deep vein thrombosis) (HCC) 10/29/2015  . Sickle cell trait [facility])     Past Surgical History:  Procedure Laterality Date  . COLPOSCOPY    . FRACTURE SURGERY Right    3 surgeries  . TUBAL LIGATION Bilateral 04/23/2016   Procedure: POST PARTUM TUBAL LIGATION;  Surgeon: Lesly Dukes, MD;  Location: [facility];  Service: Gynecology;  Laterality: Bilateral;    OB History    Gravida Para Term Preterm AB Living   5 5 5  0 0 5   SAB TAB Ectopic Multiple Live Births   0 0 0 0 5       Home Medications    Prior to Admission medications     Medication Sig Start Date End Date Taking? Authorizing Provider  rivaroxaban (XARELTO) 20 MG TABS tablet Take 1 tablet (20 mg total) by mouth daily with supper. Patient taking differently: Take 20 mg by mouth every morning.  05/23/16  Yes Hairston, Leonia Reeves R, FNP  sertraline (ZOLOFT) 50 MG tablet Take 1 tablet (50 mg total) by mouth daily. 11/20/15  Yes [name], DO  famotidine (PEPCID) 20 MG tablet Take 1 tablet (20 mg total) by mouth 2 (two) times daily. Patient taking differently: Take 20 mg by mouth 2 (two) times daily as needed for heartburn.  03/02/16   Constant, Peggy, MD  ibuprofen (ADVIL,MOTRIN) 800 MG tablet Take 1 tablet (800 mg total) by mouth every 8 (eight) hours as needed for moderate pain. 05/06/16   Lizbeth Bark, FNP    Family History Family History  Problem Relation Age of Onset  . Asthma Sister   . Diabetes Maternal Aunt     Social History Social History  Substance Use Topics  . Smoking status: Former Smoker    Packs/day: 0.25    Types: Cigarettes    Quit date: 11/04/2015  . Smokeless tobacco: Never Used  . Alcohol use Yes     Comment: once a week     Allergies   Penicillins   Review of Systems Review of Systems  Gastrointestinal: Positive for abdominal pain.  All  other systems reviewed and are negative.    Physical Exam Updated Vital Signs BP 118/76 (BP Location: Right Arm)   Pulse 66   Temp 97.9 F (36.6 C) (Oral)   Resp 16   Ht 5\' 4"  (1.626 m)   Wt 79.4 kg (175 lb)   LMP 10/16/2016 (Within Days)   SpO2 100%   BMI 30.04 kg/m   Physical Exam  Constitutional: She is oriented to person, place, and time. She appears well-developed and well-nourished.  HENT:  Head: Normocephalic and atraumatic.  Cardiovascular: Normal rate and regular rhythm.   No murmur heard. Pulmonary/Chest: Effort normal and breath sounds normal. No respiratory distress.  Abdominal: Soft. There is no tenderness. There is no rebound and no guarding.   Genitourinary:  Genitourinary Comments: Scant white vaginal discharge. No CMT or adnexal tenderness  Musculoskeletal: She exhibits no edema or tenderness.  Neurological: She is alert and oriented to person, place, and time.  Skin: Skin is warm and dry.  Psychiatric: She has a normal mood and affect. Her behavior is normal.  Nursing note and vitals reviewed.    ED Treatments / Results  Labs (all labs ordered are listed, but only abnormal results are displayed) Labs Reviewed  WET PREP, GENITAL - Abnormal; Notable for the following:       Result Value   WBC, Wet Prep HPF POC MANY (*)    All other components within normal limits  COMPREHENSIVE METABOLIC PANEL - Abnormal; Notable for the following:    ALT 12 (*)    All other components within normal limits  CBC - Abnormal; Notable for the following:    RBC 3.79 (*)    Hemoglobin 10.3 (*)    HCT 33.1 (*)    RDW 16.4 (*)    All other components within normal limits  URINALYSIS, ROUTINE W REFLEX MICROSCOPIC - Abnormal; Notable for the following:    APPearance HAZY (*)    All other components within normal limits  LIPASE, BLOOD  RPR  HIV ANTIBODY (ROUTINE TESTING)  POC URINE PREG, ED  [facility]    EKG  EKG Interpretation None       Radiology No results found.  Procedures Procedures (including critical care time)  Medications Ordered in ED Medications - No data to display   Initial Impression / Assessment and Plan / ED Course  I have reviewed the triage vital signs and the nursing notes.  Pertinent labs & imaging results that were available during my care of the patient were reviewed by me and considered in my medical decision making (see chart for details).     Patient here for evaluation of vaginal discharge and intermittent pelvic pain. She has no pain on examination in the emergency department. Presentation is not consistent with torsion, renal colic, appendicitis,  tubo-ovarian abscess, PID. Labs demonstrate stable anemia. Discussed patient unclear etiology of her symptoms. Discussed outpatient follow-up and close return precautions for repeat abdominal exam if  she has any recurrent or worsening pain.  Final Clinical Impressions(s) / ED Diagnoses   Final diagnoses:  Pelvic pain in female    New Prescriptions New Prescriptions   No medications on file     Tilden Fossaees, Nancy Tolliver, MD 11/14/16 1132

## 2016-11-14 NOTE — ED Notes (Signed)
MD at the bedside  

## 2016-11-14 NOTE — ED Triage Notes (Addendum)
Per Pt, Pt reports intermittent sharp pains that started two days ago in her right lower abdomen. Denies N/V. Two weeks ago pt reports ed having diarrhea, but she has not had diarrhea in the last couple days. Reports some vaginal discharge that started this morning that is "abnormal."

## 2016-11-15 LAB — HIV ANTIBODY (ROUTINE TESTING W REFLEX): HIV SCREEN 4TH GENERATION: NONREACTIVE

## 2016-11-15 LAB — RPR: RPR Ser Ql: NONREACTIVE

## 2016-11-17 LAB — GC/CHLAMYDIA PROBE AMP (~~LOC~~) NOT AT ARMC
Chlamydia: NEGATIVE
NEISSERIA GONORRHEA: NEGATIVE

## 2016-11-29 ENCOUNTER — Other Ambulatory Visit: Payer: Self-pay | Admitting: Family Medicine

## 2017-01-31 NOTE — Telephone Encounter (Signed)
This encounter was created in error - please disregard.

## 2017-02-25 ENCOUNTER — Emergency Department (HOSPITAL_COMMUNITY)
Admission: EM | Admit: 2017-02-25 | Discharge: 2017-02-25 | Disposition: A | Discharge status: Home / Self Care | Payer: Medicaid Other | Attending: Emergency Medicine | Admitting: Emergency Medicine

## 2017-02-25 ENCOUNTER — Encounter (HOSPITAL_COMMUNITY): Payer: Self-pay

## 2017-02-25 ENCOUNTER — Other Ambulatory Visit: Payer: Self-pay

## 2017-02-25 DIAGNOSIS — D573 Sickle-cell trait: Secondary | ICD-10-CM | POA: Insufficient documentation

## 2017-02-25 DIAGNOSIS — N9089 Other specified noninflammatory disorders of vulva and perineum: Secondary | ICD-10-CM | POA: Diagnosis not present

## 2017-02-25 DIAGNOSIS — Z79899 Other long term (current) drug therapy: Secondary | ICD-10-CM | POA: Diagnosis not present

## 2017-02-25 DIAGNOSIS — Z86711 Personal history of pulmonary embolism: Secondary | ICD-10-CM | POA: Diagnosis not present

## 2017-02-25 DIAGNOSIS — Z7901 Long term (current) use of anticoagulants: Secondary | ICD-10-CM | POA: Insufficient documentation

## 2017-02-25 DIAGNOSIS — Z87891 Personal history of nicotine dependence: Secondary | ICD-10-CM | POA: Diagnosis not present

## 2017-02-25 DIAGNOSIS — Z880 Allergy status to penicillin: Secondary | ICD-10-CM | POA: Diagnosis not present

## 2017-02-25 DIAGNOSIS — L299 Pruritus, unspecified: Secondary | ICD-10-CM | POA: Diagnosis not present

## 2017-02-25 LAB — URINALYSIS, ROUTINE W REFLEX MICROSCOPIC
Bilirubin Urine: NEGATIVE
GLUCOSE, UA: NEGATIVE mg/dL
HGB URINE DIPSTICK: NEGATIVE
Ketones, ur: NEGATIVE mg/dL
Leukocytes, UA: NEGATIVE
NITRITE: NEGATIVE
PH: 6 (ref 5.0–8.0)
Protein, ur: 30 mg/dL — AB
Specific Gravity, Urine: 1.026 (ref 1.005–1.030)

## 2017-02-25 LAB — WET PREP, GENITAL
CLUE CELLS WET PREP: NONE SEEN
SPERM: NONE SEEN
Trich, Wet Prep: NONE SEEN
Yeast Wet Prep HPF POC: NONE SEEN

## 2017-02-25 NOTE — ED Triage Notes (Signed)
Pt noticed lump on labia, not painful.   Pt not sure how long lump has been there.

## 2017-02-25 NOTE — Discharge Instructions (Signed)
As discussed, follow-up with GYN.  You will receive a phone call if any of your results come back positive in the next few days.  Avoid sexual intercourse for at least 7 days after treatment and until symptoms have resolved.  Return if symptoms worsen, abdominal pain, nausea and vomiting, back pain, fever, chills or new concerning symptoms in the meantime.

## 2017-02-25 NOTE — ED Provider Notes (Signed)
[facility] Provider Note   CSN: [id number] Arrival date & time: 02/25/17  1256     History   Chief Complaint Chief Complaint  Patient presents with  . Mass    HPI Nancy Francis is a 36 y.o. female [id number] with a past medical history of sickle cell trait, DVT/PE, anxiety and depression presenting with a nonpainful labial lesion which she noticed today.  She also reports some pruritus, denies dysuria, hematuria, fever, chills. Hx of unprotected intercourse with same partner.  HPI  Past Medical History:  Diagnosis Date  . Abnormal Pap smear 2010   repeat was negative  . Anemia   . Anxiety   . Depression   . PE (pulmonary thromboembolism) (HCC)   . Sickle cell trait [facility](HCC)     Patient Active Problem List   Diagnosis Date Noted  . Depression 05/06/2016  . Normal labor 04/21/2016  . Adjustment disorder with depressed mood 01/16/2016  . Abnormal maternal serum screening test 11/23/2015  . Supervision of high risk pregnancy, antepartum 10/29/2015  . Hx Pulmonary embolism, currently pregnant, antepartum 10/29/2015  . DVT of upper extremity (deep vein thrombosis) (HCC) 10/29/2015  . Sickle cell trait [facility](HCC)     Past Surgical History:  Procedure Laterality Date  . COLPOSCOPY    . FRACTURE SURGERY Right    3 surgeries  . TUBAL LIGATION Bilateral 04/23/2016   Procedure: POST PARTUM TUBAL LIGATION;  Surgeon: Lesly DukesLeggett, Kelly H, MD;  Location: [facility];  Service: Gynecology;  Laterality: Bilateral;    OB History    Gravida Para Term Preterm AB Living   5 5 5  0 0 5   SAB TAB Ectopic Multiple Live Births   0 0 0 0 5       Home Medications    Prior to Admission medications   Medication Sig Start Date End Date Taking? Authorizing Provider  ibuprofen (ADVIL,MOTRIN) 800 MG tablet Take 1 tablet (800 mg total) by mouth every 8 (eight) hours as needed for moderate pain. 05/06/16  Yes Hairston, Oren BeckmannMandesia R, FNP  rivaroxaban (XARELTO) 20  MG TABS tablet Take 1 tablet (20 mg total) by mouth daily with supper. Patient taking differently: Take 20 mg by mouth every morning.  05/23/16  Yes Hairston, Leonia ReevesMandesia R, FNP  sertraline (ZOLOFT) 50 MG tablet Take 1 tablet (50 mg total) by mouth daily. 11/20/15  Yes Levie HeritageStinson, Jacob J, DO  famotidine (PEPCID) 20 MG tablet Take 1 tablet (20 mg total) by mouth 2 (two) times daily. Patient not taking: Reported on 02/25/2017 03/02/16   Constant, Peggy, MD    Family History Family History  Problem Relation Age of Onset  . Asthma Sister   . Diabetes Maternal Aunt     Social History Social History   Tobacco Use  . Smoking status: Former Smoker    Packs/day: 0.25    Types: Cigarettes    Last attempt to quit: 11/04/2015    Years since quitting: 1.3  . Smokeless tobacco: Never Used  Substance Use Topics  . Alcohol use: Yes    Comment: once a week  . Drug use: No     Allergies   Penicillins   Review of Systems Review of Systems  Constitutional: Negative for chills, diaphoresis, fatigue and fever.  Respiratory: Negative for cough, shortness of breath, wheezing and stridor.   Cardiovascular: Negative for chest pain and palpitations.  Gastrointestinal: Negative for abdominal distention, abdominal pain, nausea and vomiting.  Genitourinary: Positive for genital sores.  Negative for difficulty urinating, dyspareunia, dysuria, flank pain and hematuria.  Musculoskeletal: Negative for arthralgias, back pain and joint swelling.  Skin: Negative for color change, pallor and rash.  Neurological: Negative for dizziness, light-headedness and headaches.     Physical Exam Updated Vital Signs BP 122/78   Pulse (!) 102   Temp 98.6 F (37 C) (Oral)   Resp 17   Ht 5\' 4"  (1.626 m)   Wt 77.1 kg (170 lb)   LMP 01/15/2017   SpO2 100%   BMI 29.18 kg/m   Physical Exam  Constitutional: She appears well-developed and well-nourished. No distress.  Afebrile, nontoxic-appearing, lying comfortably in  bed no acute distress.  HENT:  Head: Normocephalic and atraumatic.  Eyes: Conjunctivae are normal. Right eye exhibits no discharge. Left eye exhibits no discharge.  Neck: Normal range of motion.  Cardiovascular: Normal rate, regular rhythm and normal heart sounds.  No murmur heard. Pulmonary/Chest: Effort normal and breath sounds normal. No stridor. No respiratory distress. She has no wheezes. She has no rales.  Abdominal: Soft. She exhibits no distension. There is no tenderness.  Genitourinary: Vagina normal.  Genitourinary Comments: Normal clear vaginal discharge.  No cervical motion tenderness, adnexal mass or tenderness on palpation. Slightly raised area near the Bartholin gland.  She reports no tenderness.  Musculoskeletal: Normal range of motion. She exhibits no edema or deformity.  Neurological: She is alert.  Skin: Skin is warm and dry. No rash noted. She is not diaphoretic. No erythema. No pallor.  Psychiatric: She has a normal mood and affect.  Nursing note and vitals reviewed.    ED Treatments / Results  Labs (all labs ordered are listed, but only abnormal results are displayed) Labs Reviewed  WET PREP, GENITAL - Abnormal; Notable for the following components:      Result Value   WBC, Wet Prep HPF POC MANY (*)    All other components within normal limits  URINALYSIS, ROUTINE W REFLEX MICROSCOPIC - Abnormal; Notable for the following components:   APPearance HAZY (*)    Protein, ur 30 (*)    Bacteria, UA RARE (*)    Squamous Epithelial / LPF 0-5 (*)    All other components within normal limits  RPR  HIV ANTIBODY (ROUTINE TESTING)  GC/CHLAMYDIA PROBE AMP ([place]) NOT AT [facility]    EKG  EKG Interpretation None       Radiology No results found.  Procedures Procedures (including critical care time)  Medications Ordered in ED Medications - No data to display   Initial Impression / Assessment and Plan / ED Course  I have reviewed the triage vital signs  and the nursing notes.  Pertinent labs & imaging results that were available during my care of the patient were reviewed by me and considered in my medical decision making (see chart for details).    Patient presenting with what may be a right early bartholin cyst. No lesions concerning for HSV or syphilis, no labial abscess.  She is otherwise well-appearing, non-toxic afebrile. No abnormal vaginal discharge or cervical motion tenderness on exam.  Obtained, STI panel. Will dc home with close follow up with Gyn.  Discussed strict return precautions and advised to return to the emergency department if experiencing any new or worsening symptoms. Instructions were understood and patient agreed with discharge plan. Final Clinical Impressions(s) / ED Diagnoses   Final diagnoses:  Lesion of labia    ED Discharge Orders    None  Georgiana Shore, PA-C 02/25/17 1840    Linwood Dibbles, MD 02/26/17 [phone]

## 2017-02-25 NOTE — ED Notes (Signed)
Pelvic cart setup at bedside, patient undressed from waist down

## 2017-02-25 NOTE — ED Notes (Signed)
Pt given warm blankets and ice; provided urine specimen cup and instructed to provide sample when ready

## 2017-02-26 LAB — RPR: RPR: NONREACTIVE

## 2017-02-26 LAB — HIV ANTIBODY (ROUTINE TESTING W REFLEX): HIV SCREEN 4TH GENERATION: NONREACTIVE

## 2017-02-27 LAB — GC/CHLAMYDIA PROBE AMP (~~LOC~~) NOT AT ARMC
Chlamydia: NEGATIVE
Neisseria Gonorrhea: NEGATIVE

## 2017-03-08 NOTE — Progress Notes (Signed)
Patient ID: Nancy Francis, female   DOB: 09/14/1980, 36 y.o.   MRN: [id number]       Nancy Francis, is a 36 y.o. female  [id number]  [id number]  DOB - 04/28/1980  Subjective:  Chief Complaint and HPI: Nancy Francis is a 36 y.o. female here today for a follow up visit After being seen in the ED for a non-painful lesion of the labia.  No trich/GC/Chlamydia.  Syphillis/HIV negative. Still has area on lbia-not painful.  No f/c.  Dr Jolayne Pantheronstant is gyn.  She hasn't been seen there since her baby was born in February.  She just noticed the area a few weeks ago.  ED felt as though it might be a Bartholin's cyst and she should f/up with gyn.   Also B foot pain in the area of the plantar fascia B.  Relieved with naproxen. NKI.    Needs RF on xarelto and zoloft.  No sob, CP, palpitations.  Had 2 PE prior to pregnancy and supposed to be on xarelto long-term by history.  No calf pain.  Stable on zoloft.  No SI/HI.    ED/Hospital notes reviewed.     ROS:   Constitutional:  No f/c, No night sweats, No unexplained weight loss. EENT:  No vision changes, No blurry vision, No hearing changes. No mouth, throat, or ear problems.  Respiratory: No cough, No SOB Cardiac: No CP, no palpitations GI:  No abd pain, No N/V/D. GU: No Urinary s/sx Musculoskeletal: B foot pain Neuro: No headache, no dizziness, no motor weakness.  Skin: No rash Endocrine:  No polydipsia. No polyuria.  Psych: Denies SI/HI  No problems updated.  ALLERGIES: Allergies  Allergen Reactions  . Penicillins Hives    Has patient had a PCN reaction causing immediate rash, facial/tongue/throat swelling, SOB or lightheadedness with hypotension: Yes Has patient had a PCN reaction causing severe rash involving mucus membranes or skin necrosis: Yes Has patient had a PCN reaction that required hospitalization Yes Has patient had a PCN reaction occurring within the last 10 years: No If all of the above answers are  "NO", then may proceed with Cephalosporin use.     PAST MEDICAL HISTORY: Past Medical History:  Diagnosis Date  . Abnormal Pap smear 2010   repeat was negative  . Anemia   . Anxiety   . Depression   . PE (pulmonary thromboembolism) (HCC)   . Sickle cell trait (HCC)     MEDICATIONS AT HOME: Prior to Admission medications   Medication Sig Start Date End Date Taking? Authorizing Provider  ibuprofen (ADVIL,MOTRIN) 800 MG tablet Take 1 tablet (800 mg total) by mouth every 8 (eight) hours as needed for moderate pain. 05/06/16  Yes Hairston, Oren BeckmannMandesia R, FNP  naproxen (NAPROSYN) 500 MG tablet Take 1 tablet (500 mg total) by mouth 2 (two) times daily with a meal. X 2 weeks then prn pain 03/09/17  Yes Mariaceleste Herrera M, PA-C  sertraline (ZOLOFT) 50 MG tablet Take 1 tablet (50 mg total) by mouth daily. 11/20/15  Yes Levie HeritageStinson, Jacob J, DO  famotidine (PEPCID) 20 MG tablet Take 1 tablet (20 mg total) by mouth 2 (two) times daily. Patient not taking: Reported on 02/25/2017 03/02/16   Constant, Peggy, MD  rivaroxaban (XARELTO) 20 MG TABS tablet Take 1 tablet (20 mg total) by mouth daily with supper. 03/09/17   Anders SimmondsMcClung, Aithan Farrelly M, PA-C     Objective:  EXAM:   Vitals:   03/09/17 1424  BP: 114/73  Pulse: 94  Resp: 18  Temp: 98.2 F (36.8 C)  TempSrc: Oral  SpO2: 99%  Weight: 194 lb (88 kg)  Height: 5\' 4"  (1.626 m)    General appearance : A&OX3. NAD. Non-toxic-appearing HEENT: Atraumatic and Normocephalic.  PERRLA. EOM intact.  TM clear B. Mouth-MMM, post pharynx WNL w/o erythema, No PND. Neck: supple, no JVD. No cervical lymphadenopathy. No thyromegaly Chest/Lungs:  Breathing-non-labored, Good air entry bilaterally, breath sounds normal without rales, rhonchi, or wheezing  CVS: S1 S2 regular, no murmurs, gallops, rubs  Extremities: Bilateral Lower Ext shows no edema, both legs are warm to touch with = pulse throughout. TTP on B plantar fascia Neurology:  CN II-XII grossly intact, Non  focal.   Psych:  TP linear. J/I WNL. Normal speech. Appropriate eye contact and affect.  Skin:  No Rash  Data Review No results found for: HGBA1C   Assessment & Plan   1. Plantar fasciitis - naproxen (NAPROSYN) 500 MG tablet; Take 1 tablet (500 mg total) by mouth 2 (two) times daily with a meal. X 2 weeks then prn pain  Dispense: 60 tablet; Refill: 1 Stretches taught and encouraged.    2. Anticoagulant long-term use - rivaroxaban (XARELTO) 20 MG TABS tablet; Take 1 tablet (20 mg total) by mouth daily with supper.  Dispense: 30 tablet; Refill: 2  3. History of DVT (deep vein thrombosis) - rivaroxaban (XARELTO) 20 MG TABS tablet; Take 1 tablet (20 mg total) by mouth daily with supper.  Dispense: 30 tablet; Refill: 2  4. History of pulmonary embolus (PE) - rivaroxaban (XARELTO) 20 MG TABS tablet; Take 1 tablet (20 mg total) by mouth daily with supper.  Dispense: 30 tablet; Refill: 2  5. Labial lesion Make appt with gyn  Patient have been counseled extensively about nutrition and exercise  Return in about 3 months (around 06/07/2017) [facility]/o [facility]VT, xarelto.  The patient was given clear instructions to go to ER or return to medical center if symptoms don't improve, worsen or new problems develop. The patient verbalized understanding. The patient was told to call to get lab results if they haven't heard anything in the next week.     Georgian CoAngela Rakeem Colley, PA-C [facility] [place], [place] [phone]   03/09/2017, 2:30 PM

## 2017-03-09 ENCOUNTER — Ambulatory Visit: Payer: Medicaid Other | Attending: Family Medicine | Admitting: Physician Assistant

## 2017-03-09 VITALS — BP 114/73 | HR 94 | Temp 98.2°F | Resp 18 | Ht 64.0 in | Wt 194.0 lb

## 2017-03-09 DIAGNOSIS — Z86711 Personal history of pulmonary embolism: Secondary | ICD-10-CM | POA: Insufficient documentation

## 2017-03-09 DIAGNOSIS — D573 Sickle-cell trait: Secondary | ICD-10-CM | POA: Insufficient documentation

## 2017-03-09 DIAGNOSIS — Z880 Allergy status to penicillin: Secondary | ICD-10-CM | POA: Insufficient documentation

## 2017-03-09 DIAGNOSIS — F419 Anxiety disorder, unspecified: Secondary | ICD-10-CM | POA: Diagnosis not present

## 2017-03-09 DIAGNOSIS — N9089 Other specified noninflammatory disorders of vulva and perineum: Secondary | ICD-10-CM | POA: Diagnosis present

## 2017-03-09 DIAGNOSIS — Z79899 Other long term (current) drug therapy: Secondary | ICD-10-CM | POA: Diagnosis not present

## 2017-03-09 DIAGNOSIS — F329 Major depressive disorder, single episode, unspecified: Secondary | ICD-10-CM | POA: Insufficient documentation

## 2017-03-09 DIAGNOSIS — Z86718 Personal history of other venous thrombosis and embolism: Secondary | ICD-10-CM

## 2017-03-09 DIAGNOSIS — M722 Plantar fascial fibromatosis: Secondary | ICD-10-CM | POA: Diagnosis not present

## 2017-03-09 DIAGNOSIS — Z7901 Long term (current) use of anticoagulants: Secondary | ICD-10-CM | POA: Insufficient documentation

## 2017-03-09 MED ORDER — NAPROXEN 500 MG PO TABS: 500.0000 mg | ORAL_TABLET | Freq: Two times a day (BID) | ORAL | 1 refills | Status: AC

## 2017-03-09 MED ORDER — RIVAROXABAN 20 MG PO TABS: 20.0000 mg | ORAL_TABLET | Freq: Every day | ORAL | 2 refills | Status: AC

## 2017-03-09 MED ORDER — SERTRALINE HCL 50 MG PO TABS: 50.0000 mg | ORAL_TABLET | Freq: Every day | ORAL | 3 refills | Status: AC

## 2017-03-09 NOTE — Patient Instructions (Signed)
Call Dr Deretha Emoryonstant's office for vaginal area concern.

## 2017-04-06 ENCOUNTER — Ambulatory Visit: Payer: Medicaid Other | Admitting: Obstetrics & Gynecology

## 2017-06-08 ENCOUNTER — Ambulatory Visit: Payer: Medicaid Other | Admitting: Family Medicine

## 2017-06-22 ENCOUNTER — Ambulatory Visit: Payer: Medicaid Other | Admitting: Nurse Practitioner

## 2017-07-31 ENCOUNTER — Ambulatory Visit (INDEPENDENT_AMBULATORY_CARE_PROVIDER_SITE_OTHER): Payer: Medicaid Other | Admitting: Nurse Practitioner

## 2020-05-10 ENCOUNTER — Emergency Department (HOSPITAL_COMMUNITY)
Admission: EM | Admit: 2020-05-10 | Discharge: 2020-05-10 | Disposition: A | Discharge status: Home / Self Care | Payer: Medicaid Other | Attending: Emergency Medicine | Admitting: Emergency Medicine

## 2020-05-10 ENCOUNTER — Emergency Department (HOSPITAL_COMMUNITY): Payer: Medicaid Other

## 2020-05-10 ENCOUNTER — Other Ambulatory Visit: Payer: Self-pay

## 2020-05-10 ENCOUNTER — Encounter (HOSPITAL_COMMUNITY): Payer: Self-pay | Admitting: *Deleted

## 2020-05-10 DIAGNOSIS — J069 Acute upper respiratory infection, unspecified: Secondary | ICD-10-CM | POA: Insufficient documentation

## 2020-05-10 DIAGNOSIS — Z20822 Contact with and (suspected) exposure to covid-19: Secondary | ICD-10-CM | POA: Diagnosis not present

## 2020-05-10 DIAGNOSIS — Z87891 Personal history of nicotine dependence: Secondary | ICD-10-CM | POA: Diagnosis not present

## 2020-05-10 DIAGNOSIS — Z86711 Personal history of pulmonary embolism: Secondary | ICD-10-CM | POA: Diagnosis not present

## 2020-05-10 DIAGNOSIS — Z86718 Personal history of other venous thrombosis and embolism: Secondary | ICD-10-CM | POA: Diagnosis not present

## 2020-05-10 DIAGNOSIS — R059 Cough, unspecified: Secondary | ICD-10-CM | POA: Diagnosis present

## 2020-05-10 DIAGNOSIS — Z7901 Long term (current) use of anticoagulants: Secondary | ICD-10-CM | POA: Diagnosis not present

## 2020-05-10 LAB — SARS CORONAVIRUS 2 (TAT 6-24 HRS): SARS Coronavirus 2: NEGATIVE

## 2020-05-10 LAB — CBC WITH DIFFERENTIAL/PLATELET
Abs Immature Granulocytes: 0.02 10*3/uL (ref 0.00–0.07)
Basophils Absolute: 0 10*3/uL (ref 0.0–0.1)
Basophils Relative: 0 %
Eosinophils Absolute: 0 10*3/uL (ref 0.0–0.5)
Eosinophils Relative: 0 %
HCT: 35.5 % — ABNORMAL LOW (ref 36.0–46.0)
Hemoglobin: 10.8 g/dL — ABNORMAL LOW (ref 12.0–15.0)
Immature Granulocytes: 0 %
Lymphocytes Relative: 19 %
Lymphs Abs: 1.4 10*3/uL (ref 0.7–4.0)
MCH: 28.3 pg (ref 26.0–34.0)
MCHC: 30.4 g/dL (ref 30.0–36.0)
MCV: 92.9 fL (ref 80.0–100.0)
Monocytes Absolute: 1 10*3/uL (ref 0.1–1.0)
Monocytes Relative: 14 %
Neutro Abs: 4.7 10*3/uL (ref 1.7–7.7)
Neutrophils Relative %: 67 %
Platelets: 270 10*3/uL (ref 150–400)
RBC: 3.82 MIL/uL — ABNORMAL LOW (ref 3.87–5.11)
RDW: 21.2 % — ABNORMAL HIGH (ref 11.5–15.5)
WBC: 7.1 10*3/uL (ref 4.0–10.5)
nRBC: 0 % (ref 0.0–0.2)

## 2020-05-10 LAB — BASIC METABOLIC PANEL
Anion gap: 11 (ref 5–15)
BUN: 8 mg/dL (ref 6–20)
CO2: 21 mmol/L — ABNORMAL LOW (ref 22–32)
Calcium: 8.8 mg/dL — ABNORMAL LOW (ref 8.9–10.3)
Chloride: 104 mmol/L (ref 98–111)
Creatinine, Ser: 0.75 mg/dL (ref 0.44–1.00)
GFR, Estimated: >60 mL/min (ref >60)
Glucose, Bld: 84 mg/dL (ref 70–99)
Potassium: 4.1 mmol/L (ref 3.5–5.1)
Sodium: 136 mmol/L (ref 135–145)

## 2020-05-10 LAB — CBG MONITORING, ED: Glucose-Capillary: 84 mg/dL (ref 70–99)

## 2020-05-10 NOTE — ED Provider Notes (Signed)
[facility] EMERGENCY DEPARTMENT Provider Note   CSN: [id number] Arrival date & time: 05/10/20  1358     History Chief Complaint  Patient presents with  . Cough    Nancy Francis is a 40 y.o. female.  HPI   Patient presents to the ED with complaints of fatigue and a cough.  Patient states she flew yesterday.  After flight she started developing a cough.  She has pain in her chest associated with the coughing.  She denies any fevers.  No nasal congestion.  She denies any leg swelling.  Patient states she is also feeling very fatigued and tired.  She does have a history of pulmonary embolism but has been taking her anticoagulants regularly.  She is not feeling short of breath.  Patient has not been vaccinated for COVID  Past Medical History:  Diagnosis Date  . Abnormal Pap smear 2010   repeat was negative  . Anemia   . Anxiety   . Depression   . PE (pulmonary thromboembolism) (HCC)   . Sickle cell trait [facility])     Patient Active Problem List   Diagnosis Date Noted  . Depression 05/06/2016  . Normal labor 04/21/2016  . Adjustment disorder with depressed mood 01/16/2016  . Abnormal maternal serum screening test 11/23/2015  . Supervision of high risk pregnancy, antepartum 10/29/2015  . Hx Pulmonary embolism, currently pregnant, antepartum 10/29/2015  . DVT of upper extremity (deep vein thrombosis) (HCC) 10/29/2015  . Sickle cell trait [facility])     Past Surgical History:  Procedure Laterality Date  . COLPOSCOPY    . FRACTURE SURGERY Right    3 surgeries  . TUBAL LIGATION Bilateral 04/23/2016   Procedure: POST PARTUM TUBAL LIGATION;  Surgeon: Lesly Dukes, MD;  Location: [facility] ORS;  Service: Gynecology;  Laterality: Bilateral;     OB History    Gravida  5   Para  5   Term  5   Preterm  0   AB  0   Living  5     SAB  0   IAB  0   Ectopic  0   Multiple  0   Live Births  5           Family History  Problem Relation Age of Onset   . Asthma Sister   . Diabetes Maternal Aunt     Social History   Tobacco Use  . Smoking status: Former Smoker    Packs/day: 0.25    Types: Cigarettes    Quit date: 11/04/2015    Years since quitting: 4.5  . Smokeless tobacco: Never Used  Substance Use Topics  . Alcohol use: Yes    Comment: once a week  . Drug use: No    Home Medications Prior to Admission medications   Medication Sig Start Date End Date Taking? Authorizing Provider  famotidine (PEPCID) 20 MG tablet Take 1 tablet (20 mg total) by mouth 2 (two) times daily. Patient not taking: Reported on 02/25/2017 03/02/16   Constant, Peggy, MD  naproxen (NAPROSYN) 500 MG tablet Take 1 tablet (500 mg total) by mouth 2 (two) times daily with a meal. X 2 weeks then prn pain 03/09/17   Georgian Co M, PA-C  rivaroxaban (XARELTO) 20 MG TABS tablet Take 1 tablet (20 mg total) by mouth daily with supper. 03/09/17   Anders Simmonds, PA-C  sertraline (ZOLOFT) 50 MG tablet Take 1 tablet (50 mg total) by mouth daily. 03/09/17  Georgian Co M, PA-C    Allergies    Penicillins  Review of Systems   Review of Systems  All other systems reviewed and are negative.   Physical Exam Updated Vital Signs BP 124/78   Pulse 90   Temp 98.6 F (37 C) (Oral)   Resp 12   Ht 1.626 m (5\' 4" )   Wt 81.6 kg   SpO2 100%   BMI 30.90 kg/m   Physical Exam Vitals and nursing note reviewed.  Constitutional:      General: She is not in acute distress.    Appearance: She is well-developed and well-nourished.  HENT:     Head: Normocephalic and atraumatic.     Right Ear: External ear normal.     Left Ear: External ear normal.  Eyes:     General: No scleral icterus.       Right eye: No discharge.        Left eye: No discharge.     Conjunctiva/sclera: Conjunctivae normal.  Neck:     Trachea: No tracheal deviation.  Cardiovascular:     Rate and Rhythm: Normal rate and regular rhythm.     Pulses: Intact distal pulses.  Pulmonary:      Effort: Pulmonary effort is normal. No respiratory distress.     Breath sounds: Normal breath sounds. No stridor. No wheezing or rales.  Abdominal:     General: Bowel sounds are normal. There is no distension.     Palpations: Abdomen is soft.     Tenderness: There is no abdominal tenderness. There is no guarding or rebound.  Musculoskeletal:        General: No tenderness or edema.     Cervical back: Neck supple.     Right lower leg: No edema.     Left lower leg: No edema.  Skin:    General: Skin is warm and dry.     Findings: No rash.  Neurological:     Mental Status: She is alert.     Cranial Nerves: No cranial nerve deficit (no facial droop, extraocular movements intact, no slurred speech).     Sensory: No sensory deficit.     Motor: No abnormal muscle tone or seizure activity.     Coordination: Coordination normal.     Deep Tendon Reflexes: Strength normal.  Psychiatric:        Mood and Affect: Mood and affect normal.     ED Results / Procedures / Treatments   Labs (all labs ordered are listed, but only abnormal results are displayed) Labs Reviewed  CBC WITH DIFFERENTIAL/PLATELET - Abnormal; Notable for the following components:      Result Value   RBC 3.82 (*)    Hemoglobin 10.8 (*)    HCT 35.5 (*)    RDW 21.2 (*)    All other components within normal limits  BASIC METABOLIC PANEL - Abnormal; Notable for the following components:   CO2 21 (*)    Calcium 8.8 (*)    All other components within normal limits  SARS CORONAVIRUS 2 (TAT 6-24 HRS)  CBG MONITORING, ED    EKG None  Radiology DG Chest Portable 1 View  Result Date: 05/10/2020 CLINICAL DATA:  Cough. EXAM: PORTABLE CHEST 1 VIEW COMPARISON:  June 02, 2014. FINDINGS: The heart size and mediastinal contours are within normal limits. Both lungs are clear. The visualized skeletal structures are unremarkable. IMPRESSION: No active disease. Electronically Signed   By: June 04, 2014 M.D.   On:  05/10/2020 17:40     Procedures Procedures   Medications Ordered in ED Medications - No data to display  ED Course  I have reviewed the triage vital signs and the nursing notes.  Pertinent labs & imaging results that were available during my care of the patient were reviewed by me and considered in my medical decision making (see chart for details).  Clinical Course as of 05/10/20 Ermalinda Barrios May 10, 2020  1554 Metabolic panel is normal.  Anemia is stable.  No leukocytosis. [JK]    Clinical Course User Index [JK] Linwood Dibbles, MD   MDM Rules/Calculators/A&P                          Patient's vital signs are stable.  She is not hypoxic.  No signs of hypotension.  Laboratory tests are reassuring.  No signs to suggest severe infection.  Patient does have a mild cough.  No evidence of pneumonia.  Etiology of her fatigue is unclear but likely related to a viral illness.  Covid test has been sent off.  Patient appears stable for discharge.  Discussed follow-up with PCP if her fatigue persists. Final Clinical Impression(s) / ED Diagnoses Final diagnoses:  Viral URI with cough    Rx / DC Orders ED Discharge Orders    None       Linwood Dibbles, MD 05/10/20 [phone]

## 2020-05-10 NOTE — ED Triage Notes (Signed)
C/o feeling lightheaded very tired and cough onset yest after flying for the first time.

## 2020-05-10 NOTE — Discharge Instructions (Addendum)
Take over-the-counter medications as needed for your cough.  The Covid test should be available tomorrow.  Check your MyChart for the results.  Follow-up with your doctor to be rechecked if your symptoms do not improve over the week
